# Patient Record
Sex: Male | Born: 1935 | Race: White | Hispanic: No | Marital: Single | Smoking: Former smoker
Health system: Southern US, Community
[De-identification: ages and names within clinical notes are randomized; demographics above are authoritative.]

## PROBLEM LIST (undated history)

## (undated) DIAGNOSIS — N400 Enlarged prostate without lower urinary tract symptoms: Secondary | ICD-10-CM

## (undated) DIAGNOSIS — J9383 Other pneumothorax: Secondary | ICD-10-CM

## (undated) DIAGNOSIS — K219 Gastro-esophageal reflux disease without esophagitis: Secondary | ICD-10-CM

## (undated) DIAGNOSIS — K5790 Diverticulosis of intestine, part unspecified, without perforation or abscess without bleeding: Secondary | ICD-10-CM

## (undated) DIAGNOSIS — I712 Thoracic aortic aneurysm, without rupture: Secondary | ICD-10-CM

## (undated) DIAGNOSIS — N390 Urinary tract infection, site not specified: Secondary | ICD-10-CM

## (undated) DIAGNOSIS — I7121 Aneurysm of the ascending aorta, without rupture: Secondary | ICD-10-CM

## (undated) DIAGNOSIS — C3490 Malignant neoplasm of unspecified part of unspecified bronchus or lung: Secondary | ICD-10-CM

## (undated) DIAGNOSIS — I10 Essential (primary) hypertension: Secondary | ICD-10-CM

## (undated) DIAGNOSIS — E119 Type 2 diabetes mellitus without complications: Secondary | ICD-10-CM

## (undated) DIAGNOSIS — E785 Hyperlipidemia, unspecified: Secondary | ICD-10-CM

## (undated) DIAGNOSIS — J449 Chronic obstructive pulmonary disease, unspecified: Secondary | ICD-10-CM

## (undated) DIAGNOSIS — M1990 Unspecified osteoarthritis, unspecified site: Secondary | ICD-10-CM

## (undated) DIAGNOSIS — I2510 Atherosclerotic heart disease of native coronary artery without angina pectoris: Secondary | ICD-10-CM

## (undated) HISTORY — PX: OTHER SURGICAL HISTORY: SHX169

## (undated) HISTORY — PX: BACK SURGERY: SHX140

## (undated) HISTORY — DX: Thoracic aortic aneurysm, without rupture: I71.2

## (undated) HISTORY — DX: Atherosclerotic heart disease of native coronary artery without angina pectoris: I25.10

## (undated) HISTORY — DX: Malignant neoplasm of unspecified part of unspecified bronchus or lung: C34.90

## (undated) HISTORY — DX: Benign prostatic hyperplasia without lower urinary tract symptoms: N40.0

## (undated) HISTORY — DX: Unspecified osteoarthritis, unspecified site: M19.90

## (undated) HISTORY — DX: Chronic obstructive pulmonary disease, unspecified: J44.9

## (undated) HISTORY — DX: Hyperlipidemia, unspecified: E78.5

## (undated) HISTORY — DX: Aneurysm of the ascending aorta, without rupture: I71.21

## (undated) HISTORY — DX: Type 2 diabetes mellitus without complications: E11.9

## (undated) HISTORY — DX: Other pneumothorax: J93.83

## (undated) HISTORY — DX: Gastro-esophageal reflux disease without esophagitis: K21.9

---

## 1976-02-16 HISTORY — PX: LUMBAR LAMINECTOMY: SHX95

## 1977-02-15 HISTORY — PX: OTHER SURGICAL HISTORY: SHX169

## 1981-02-15 HISTORY — PX: OTHER SURGICAL HISTORY: SHX169

## 1998-02-15 DIAGNOSIS — C3490 Malignant neoplasm of unspecified part of unspecified bronchus or lung: Secondary | ICD-10-CM

## 1998-02-15 HISTORY — PX: LOBECTOMY: SHX5089

## 1998-02-15 HISTORY — DX: Malignant neoplasm of unspecified part of unspecified bronchus or lung: C34.90

## 1998-10-09 ENCOUNTER — Encounter (INDEPENDENT_AMBULATORY_CARE_PROVIDER_SITE_OTHER): Payer: Self-pay | Admitting: Specialist

## 1998-10-09 ENCOUNTER — Ambulatory Visit (HOSPITAL_COMMUNITY): Admission: RE | Admit: 1998-10-09 | Discharge: 1998-10-09 | Payer: Self-pay | Admitting: Pulmonary Disease

## 1998-10-09 ENCOUNTER — Encounter: Payer: Self-pay | Admitting: Pulmonary Disease

## 1998-10-17 ENCOUNTER — Ambulatory Visit (HOSPITAL_COMMUNITY): Admission: RE | Admit: 1998-10-17 | Discharge: 1998-10-17 | Payer: Self-pay | Admitting: Pulmonary Disease

## 1998-10-17 ENCOUNTER — Encounter: Payer: Self-pay | Admitting: Pulmonary Disease

## 1998-10-21 ENCOUNTER — Encounter: Payer: Self-pay | Admitting: Pulmonary Disease

## 1998-10-22 ENCOUNTER — Encounter: Payer: Self-pay | Admitting: Thoracic Surgery

## 1998-10-23 ENCOUNTER — Encounter: Payer: Self-pay | Admitting: Thoracic Surgery

## 1998-10-23 ENCOUNTER — Encounter: Payer: Self-pay | Admitting: Internal Medicine

## 1998-10-23 ENCOUNTER — Inpatient Hospital Stay (HOSPITAL_COMMUNITY): Admission: RE | Admit: 1998-10-23 | Discharge: 1998-10-29 | Payer: Self-pay | Admitting: Thoracic Surgery

## 1998-10-24 ENCOUNTER — Encounter: Payer: Self-pay | Admitting: Thoracic Surgery

## 1998-10-24 ENCOUNTER — Encounter: Payer: Self-pay | Admitting: Internal Medicine

## 1998-10-25 ENCOUNTER — Encounter: Payer: Self-pay | Admitting: Thoracic Surgery

## 1998-10-26 ENCOUNTER — Encounter: Payer: Self-pay | Admitting: Family Medicine

## 1998-10-27 ENCOUNTER — Encounter: Payer: Self-pay | Admitting: Thoracic Surgery

## 1998-10-28 ENCOUNTER — Encounter: Payer: Self-pay | Admitting: Thoracic Surgery

## 1998-10-29 ENCOUNTER — Encounter: Payer: Self-pay | Admitting: Thoracic Surgery

## 1999-03-10 ENCOUNTER — Encounter: Admission: RE | Admit: 1999-03-10 | Discharge: 1999-03-10 | Payer: Self-pay | Admitting: Thoracic Surgery

## 1999-03-10 ENCOUNTER — Encounter: Payer: Self-pay | Admitting: Thoracic Surgery

## 1999-06-09 ENCOUNTER — Encounter: Payer: Self-pay | Admitting: Thoracic Surgery

## 1999-06-09 ENCOUNTER — Encounter: Admission: RE | Admit: 1999-06-09 | Discharge: 1999-06-09 | Payer: Self-pay | Admitting: Thoracic Surgery

## 1999-08-26 ENCOUNTER — Ambulatory Visit (HOSPITAL_COMMUNITY): Admission: RE | Admit: 1999-08-26 | Discharge: 1999-08-26 | Payer: Self-pay | Admitting: Gastroenterology

## 1999-08-26 ENCOUNTER — Encounter (INDEPENDENT_AMBULATORY_CARE_PROVIDER_SITE_OTHER): Payer: Self-pay | Admitting: Specialist

## 1999-09-08 ENCOUNTER — Encounter: Admission: RE | Admit: 1999-09-08 | Discharge: 1999-09-08 | Payer: Self-pay | Admitting: Thoracic Surgery

## 1999-09-08 ENCOUNTER — Encounter: Payer: Self-pay | Admitting: Thoracic Surgery

## 1999-12-09 ENCOUNTER — Encounter: Admission: RE | Admit: 1999-12-09 | Discharge: 1999-12-09 | Payer: Self-pay | Admitting: Thoracic Surgery

## 1999-12-09 ENCOUNTER — Encounter: Payer: Self-pay | Admitting: Thoracic Surgery

## 2000-04-12 ENCOUNTER — Encounter: Admission: RE | Admit: 2000-04-12 | Discharge: 2000-04-12 | Payer: Self-pay | Admitting: Thoracic Surgery

## 2000-04-12 ENCOUNTER — Encounter: Payer: Self-pay | Admitting: Thoracic Surgery

## 2000-05-03 ENCOUNTER — Encounter: Admission: RE | Admit: 2000-05-03 | Discharge: 2000-05-03 | Payer: Self-pay | Admitting: Family Medicine

## 2000-05-03 ENCOUNTER — Encounter: Payer: Self-pay | Admitting: Family Medicine

## 2000-10-12 ENCOUNTER — Encounter: Payer: Self-pay | Admitting: Thoracic Surgery

## 2000-10-12 ENCOUNTER — Encounter: Admission: RE | Admit: 2000-10-12 | Discharge: 2000-10-12 | Payer: Self-pay | Admitting: Thoracic Surgery

## 2002-03-30 ENCOUNTER — Encounter: Payer: Self-pay | Admitting: Family Medicine

## 2002-03-30 ENCOUNTER — Encounter: Admission: RE | Admit: 2002-03-30 | Discharge: 2002-03-30 | Payer: Self-pay | Admitting: Family Medicine

## 2002-12-30 ENCOUNTER — Emergency Department (HOSPITAL_COMMUNITY): Admission: EM | Admit: 2002-12-30 | Discharge: 2002-12-30 | Payer: Self-pay | Admitting: Emergency Medicine

## 2003-06-04 ENCOUNTER — Emergency Department (HOSPITAL_COMMUNITY): Admission: EM | Admit: 2003-06-04 | Discharge: 2003-06-04 | Payer: Self-pay | Admitting: Emergency Medicine

## 2005-06-29 ENCOUNTER — Emergency Department (HOSPITAL_COMMUNITY): Admission: EM | Admit: 2005-06-29 | Discharge: 2005-06-29 | Payer: Self-pay | Admitting: Emergency Medicine

## 2007-05-29 ENCOUNTER — Encounter: Admission: RE | Admit: 2007-05-29 | Discharge: 2007-05-29 | Payer: Self-pay | Admitting: Family Medicine

## 2007-12-27 ENCOUNTER — Encounter: Admission: RE | Admit: 2007-12-27 | Discharge: 2007-12-27 | Payer: Self-pay | Admitting: Family Medicine

## 2008-02-16 HISTORY — PX: CARDIAC CATHETERIZATION: SHX172

## 2008-03-21 ENCOUNTER — Emergency Department (HOSPITAL_COMMUNITY): Admission: EM | Admit: 2008-03-21 | Discharge: 2008-03-21 | Payer: Self-pay | Admitting: Emergency Medicine

## 2008-05-22 ENCOUNTER — Encounter: Payer: Self-pay | Admitting: Internal Medicine

## 2008-05-22 ENCOUNTER — Encounter: Payer: Self-pay | Admitting: Cardiology

## 2008-05-22 DIAGNOSIS — J449 Chronic obstructive pulmonary disease, unspecified: Secondary | ICD-10-CM

## 2008-05-22 HISTORY — DX: Chronic obstructive pulmonary disease, unspecified: J44.9

## 2008-05-23 ENCOUNTER — Encounter: Payer: Self-pay | Admitting: Cardiology

## 2008-05-29 ENCOUNTER — Encounter: Admission: RE | Admit: 2008-05-29 | Discharge: 2008-05-29 | Payer: Self-pay | Admitting: Family Medicine

## 2008-05-29 ENCOUNTER — Encounter: Payer: Self-pay | Admitting: Cardiology

## 2008-06-10 ENCOUNTER — Encounter: Admission: RE | Admit: 2008-06-10 | Discharge: 2008-06-10 | Payer: Self-pay | Admitting: Family Medicine

## 2008-06-10 ENCOUNTER — Encounter: Payer: Self-pay | Admitting: Internal Medicine

## 2008-06-13 ENCOUNTER — Ambulatory Visit: Payer: Self-pay | Admitting: Cardiology

## 2008-06-13 DIAGNOSIS — M279 Disease of jaws, unspecified: Secondary | ICD-10-CM | POA: Insufficient documentation

## 2008-06-13 DIAGNOSIS — E785 Hyperlipidemia, unspecified: Secondary | ICD-10-CM | POA: Insufficient documentation

## 2008-06-13 DIAGNOSIS — E119 Type 2 diabetes mellitus without complications: Secondary | ICD-10-CM

## 2008-06-13 DIAGNOSIS — R072 Precordial pain: Secondary | ICD-10-CM

## 2008-06-18 ENCOUNTER — Telehealth (INDEPENDENT_AMBULATORY_CARE_PROVIDER_SITE_OTHER): Payer: Self-pay | Admitting: *Deleted

## 2008-06-19 ENCOUNTER — Encounter (INDEPENDENT_AMBULATORY_CARE_PROVIDER_SITE_OTHER): Payer: Self-pay | Admitting: *Deleted

## 2008-06-19 ENCOUNTER — Encounter: Payer: Self-pay | Admitting: Cardiology

## 2008-06-19 ENCOUNTER — Ambulatory Visit: Payer: Self-pay

## 2008-06-19 ENCOUNTER — Ambulatory Visit: Payer: Self-pay | Admitting: Cardiovascular Disease

## 2008-06-19 DIAGNOSIS — R9430 Abnormal result of cardiovascular function study, unspecified: Secondary | ICD-10-CM | POA: Insufficient documentation

## 2008-06-20 LAB — CONVERTED CEMR LAB
Basophils Absolute: 0 10*3/uL (ref 0.0–0.1)
Basophils Relative: 0.3 % (ref 0.0–3.0)
CO2: 30 meq/L (ref 19–32)
Calcium: 9.5 mg/dL (ref 8.4–10.5)
Chloride: 106 meq/L (ref 96–112)
Eosinophils Absolute: 0.1 10*3/uL (ref 0.0–0.7)
Glucose, Bld: 120 mg/dL — ABNORMAL HIGH (ref 70–99)
HCT: 43.6 % (ref 39.0–52.0)
Hemoglobin: 15.1 g/dL (ref 13.0–17.0)
Lymphs Abs: 1.6 10*3/uL (ref 0.7–4.0)
MCHC: 34.6 g/dL (ref 30.0–36.0)
MCV: 88.3 fL (ref 78.0–100.0)
Monocytes Absolute: 0.5 10*3/uL (ref 0.1–1.0)
Neutro Abs: 5.3 10*3/uL (ref 1.4–7.7)
Potassium: 3.9 meq/L (ref 3.5–5.1)
RBC: 4.93 M/uL (ref 4.22–5.81)
RDW: 12.9 % (ref 11.5–14.6)
Sodium: 142 meq/L (ref 135–145)

## 2008-06-21 ENCOUNTER — Ambulatory Visit: Payer: Self-pay | Admitting: Cardiovascular Disease

## 2008-06-21 ENCOUNTER — Inpatient Hospital Stay (HOSPITAL_BASED_OUTPATIENT_CLINIC_OR_DEPARTMENT_OTHER): Admission: RE | Admit: 2008-06-21 | Discharge: 2008-06-21 | Payer: Self-pay | Admitting: Cardiovascular Disease

## 2008-06-26 ENCOUNTER — Ambulatory Visit: Payer: Self-pay | Admitting: Cardiovascular Disease

## 2008-06-26 ENCOUNTER — Inpatient Hospital Stay (HOSPITAL_COMMUNITY): Admission: RE | Admit: 2008-06-26 | Discharge: 2008-06-27 | Payer: Self-pay | Admitting: Cardiovascular Disease

## 2008-06-28 DIAGNOSIS — I10 Essential (primary) hypertension: Secondary | ICD-10-CM | POA: Insufficient documentation

## 2008-06-28 DIAGNOSIS — I080 Rheumatic disorders of both mitral and aortic valves: Secondary | ICD-10-CM | POA: Insufficient documentation

## 2008-06-28 DIAGNOSIS — J449 Chronic obstructive pulmonary disease, unspecified: Secondary | ICD-10-CM

## 2008-06-29 ENCOUNTER — Emergency Department (HOSPITAL_COMMUNITY): Admission: EM | Admit: 2008-06-29 | Discharge: 2008-06-29 | Payer: Self-pay | Admitting: Emergency Medicine

## 2008-07-02 ENCOUNTER — Ambulatory Visit: Payer: Self-pay | Admitting: Cardiology

## 2008-07-02 DIAGNOSIS — I259 Chronic ischemic heart disease, unspecified: Secondary | ICD-10-CM

## 2008-10-04 ENCOUNTER — Ambulatory Visit: Payer: Self-pay | Admitting: Cardiology

## 2008-11-21 ENCOUNTER — Encounter: Admission: RE | Admit: 2008-11-21 | Discharge: 2008-11-21 | Payer: Self-pay | Admitting: Family Medicine

## 2008-11-21 ENCOUNTER — Encounter: Payer: Self-pay | Admitting: Internal Medicine

## 2008-11-28 ENCOUNTER — Encounter: Payer: Self-pay | Admitting: Internal Medicine

## 2008-11-28 ENCOUNTER — Encounter: Admission: RE | Admit: 2008-11-28 | Discharge: 2008-11-28 | Payer: Self-pay | Admitting: Family Medicine

## 2008-12-17 ENCOUNTER — Ambulatory Visit: Payer: Self-pay | Admitting: Thoracic Surgery (Cardiothoracic Vascular Surgery)

## 2009-01-20 ENCOUNTER — Telehealth: Payer: Self-pay | Admitting: Cardiology

## 2009-02-07 ENCOUNTER — Emergency Department (HOSPITAL_COMMUNITY): Admission: EM | Admit: 2009-02-07 | Discharge: 2009-02-08 | Payer: Self-pay | Admitting: Emergency Medicine

## 2009-03-13 ENCOUNTER — Encounter: Admission: RE | Admit: 2009-03-13 | Discharge: 2009-03-13 | Payer: Self-pay | Admitting: Family Medicine

## 2009-03-13 ENCOUNTER — Encounter: Payer: Self-pay | Admitting: Internal Medicine

## 2009-04-12 ENCOUNTER — Emergency Department (HOSPITAL_COMMUNITY): Admission: EM | Admit: 2009-04-12 | Discharge: 2009-04-13 | Payer: Self-pay | Admitting: Emergency Medicine

## 2009-04-14 ENCOUNTER — Emergency Department (HOSPITAL_COMMUNITY): Admission: EM | Admit: 2009-04-14 | Discharge: 2009-04-14 | Payer: Self-pay | Admitting: Emergency Medicine

## 2009-04-14 ENCOUNTER — Telehealth: Payer: Self-pay | Admitting: Internal Medicine

## 2009-04-21 ENCOUNTER — Encounter: Payer: Self-pay | Admitting: Internal Medicine

## 2009-04-24 ENCOUNTER — Ambulatory Visit (HOSPITAL_COMMUNITY): Admission: RE | Admit: 2009-04-24 | Discharge: 2009-04-24 | Payer: Self-pay | Admitting: Family Medicine

## 2009-04-25 ENCOUNTER — Encounter: Admission: RE | Admit: 2009-04-25 | Discharge: 2009-04-25 | Payer: Self-pay | Admitting: Internal Medicine

## 2009-04-25 ENCOUNTER — Ambulatory Visit: Payer: Self-pay | Admitting: Internal Medicine

## 2009-04-25 DIAGNOSIS — R0789 Other chest pain: Secondary | ICD-10-CM | POA: Insufficient documentation

## 2009-04-25 DIAGNOSIS — R042 Hemoptysis: Secondary | ICD-10-CM | POA: Insufficient documentation

## 2009-04-25 DIAGNOSIS — Z85118 Personal history of other malignant neoplasm of bronchus and lung: Secondary | ICD-10-CM

## 2009-04-25 LAB — CONVERTED CEMR LAB
BUN: 9 mg/dL (ref 6–23)
CO2: 30 meq/L (ref 19–32)
Calcium: 9.5 mg/dL (ref 8.4–10.5)
GFR calc non Af Amer: 116.28 mL/min (ref >60)
Glucose, Bld: 123 mg/dL — ABNORMAL HIGH (ref 70–99)

## 2009-04-28 ENCOUNTER — Ambulatory Visit: Payer: Self-pay | Admitting: Cardiology

## 2009-04-28 DIAGNOSIS — I712 Thoracic aortic aneurysm, without rupture, unspecified: Secondary | ICD-10-CM | POA: Insufficient documentation

## 2010-01-05 ENCOUNTER — Encounter
Admission: RE | Admit: 2010-01-05 | Discharge: 2010-01-05 | Payer: Self-pay | Admitting: Thoracic Surgery (Cardiothoracic Vascular Surgery)

## 2010-01-05 ENCOUNTER — Ambulatory Visit: Payer: Self-pay | Admitting: Thoracic Surgery (Cardiothoracic Vascular Surgery)

## 2010-03-19 NOTE — Assessment & Plan Note (Signed)
Summary: rov per pt call/js   Visit Type:  Follow-up Primary Provider:  Mosetta Putt, MD  CC:  CAD.  History of Present Illness: The patient presents for followup of his coronary disease. Since I last saw him he has continued to have a workup for evaluation of discomfort that is in his abdomen and lower chest. He did have a CT of his chest and I reviewed this with him. It demonstrated no acute pulmonary findings and is stable descending aorta of 5 cm. He's had no new cardiovascular symptoms similar to the symptoms he had in May of last year when he had his stent. He continues on the meds as listed but says he very much wants to come off the Plavix because of easy bruising. He said recently he had some mild trauma to his knee and bruising cause significant discomfort which is only slowly resolving. He's had no new shortness of breath, PND or orthopnea. He's had no palpitations, presyncope or syncope.  Current Medications (verified): 1)  Cozaar 50 Mg Tabs (Losartan Potassium) .... Take 1 Tablet By Mouth Once A Day 2)  Flomax 0.4 Mg Xr24h-Cap (Tamsulosin Hcl) .... Take 1 Capsule By Mouth Once A Day 3)  Aspirin 81 Mg  Tabs (Aspirin) .... Daily 4)  Plavix 75 Mg Tabs (Clopidogrel Bisulfate) .Marland Kitchen.. 1 By Mouth Daily  Allergies (verified): No Known Drug Allergies  Past History:  Past Medical History: Reviewed history from 07/02/2008 and no changes required. GERD "Arthritis" [place] Spontaneous pneumothorax Diabetes x 2 -3 years (well controlled) Borderline dysplipidemia BPH Current Problems:  HYPERLIPIDEMIA (ICD-272.4) AODM (ICD-250.00) PRECORDIAL PAIN (ICD-786.51) JAW PAIN (ICD-526.9) CAD s/p Xience stent ot the distal RCA  Past Surgical History: Reviewed history from 06/28/2008 and no changes required. Right lower lobectomy 2000 Needle removed from the right foot Lumbar laminectomy Lymph node resected 1983  Review of Systems       As stated in the HPI and negative for all  other systems.   Vital Signs:  Patient profile:   77 year old male Height:      67 inches Weight:      176 pounds BMI:     27.67 Pulse rate:   67 / minute Resp:     16 per minute BP sitting:   136 / 68  (right arm)  Vitals Entered By: Marrion Coy, CNA (April 28, 2009 11:57 AM)  Physical Exam  General:  Well developed, well nourished, in no acute distress. Head:  normocephalic and atraumatic Eyes:  PERRLA/EOM intact; conjunctiva and lids normal.cataract OD.   Mouth:  Gums and palate normal. Oral mucosa normal. Neck:  Neck supple, no JVD. No masses, thyromegaly or abnormal cervical nodes. Chest Wall:  no deformities or breast masses noted Lungs:  Clear bilaterally to auscultation and percussion. Heart:  Non-displaced PMI, chest non-tender; regular rate and rhythm, S1, S2 without murmurs, rubs or gallops. Carotid upstroke normal, no bruit. Normal abdominal aortic size, no bruits. Femorals normal pulses, no bruits. Pedals normal pulses. No edema, no varicosities. Abdomen:  Bowel sounds positive; abdomen soft and non-tender without masses, organomegaly, or hernias noted. No hepatosplenomegaly. Msk:  Back normal, normal gait. Muscle strength and tone normal. Extremities:  No clubbing or cyanosis. Neurologic:  Alert and oriented x 3. Skin:  Intact without lesions or rashes. Psych:  Normal affect.   EKG  Procedure date:  04/28/2009  Findings:      sinus rhythm, rate 67, axis within normal limits, intervals within normal limits, no  acute ST-T wave changes  Impression & Recommendations:  Problem # 1:  CORONARY ARTERY DISEASE, S/P PTCA (ICD-414.9) The patient has had no symptoms suggestive of angina. At this point he will continue with risk reduction. We discussed Plavix. He very much wants to stop this medication and does have some relative contraindications. Therefore, he will stop it after the one-year anniversary of his stent. He will continue on the other medicines as  listed.  Problem # 2:  HYPERTENSION (ICD-401.9) His blood pressure is controlled on the meds as listed.  Problem # 3:  ASCENDING AORTIC ANEURYSM (ICD-441.2) This has been stable clinically and can be followed with repeat studies.  Other Orders: EKG w/ Interpretation (93000)  Patient Instructions: 1)  Your physician recommends that you schedule a follow-up appointment in: 12 months with Dr Antoine Poche 2)  Your physician recommends that you continue on your current medications as directed. Please refer to the Current Medication list given to you today.

## 2010-03-19 NOTE — Assessment & Plan Note (Signed)
Summary: consult/kcw   Primary Provider/Referring Provider:  Mosetta Putt, MD  CC:  consult.  History of Present Illness: April 25, 2009- 77 yoM referred by Dr Duaine Dredge concerned about hemoptysis.  He describes streaks of blood mixed in white sputum over a period of several days, 6 weeks ago. Daily productive cough has been increased over the past month with variable wheeze, no more blood.  had some low right back pain over the same interval with US GB reported negative and GB scan on 04/24/09 result pending.  Smoked 1-3 PPD x 37 years ending in 1986. In 2000 he saw Dr Sung Amabile for streak heme with negative CT. Bronchoscopy dx'd Squamous Cell Ca  and Dr Edwyna Shell did a curative Right Lower Lobectomy without adjuvant Rx. Has CAD and ascending aortic arch aneurysm. Coronary stent, no MI. On aspirin and plavix.Has lost weight over last 2 years and more recently gaining. Denies fever, chills, purulent discharge, nodes, nausea, heart burn. CT w/CM as ammended 11/28/08- prominent mediastinum due to 4.8cm ascending aortic aneurysm. Aortic valve calcification, CAD. PFT 05/22/08-FEV1 1.31/ 51%, FEV1/FVC 44.9 Moderate to severe obstruction. Had pneumovax.  Current Medications (verified): 1)  Cozaar 50 Mg Tabs (Losartan Potassium) .... Take 1 Tablet By Mouth Once A Day 2)  Flomax 0.4 Mg Xr24h-Cap (Tamsulosin Hcl) .... Take 1 Capsule By Mouth Once A Day 3)  Aspirin 81 Mg  Tabs (Aspirin) .... Daily 4)  Plavix 75 Mg Tabs (Clopidogrel Bisulfate) .Marland Kitchen.. 1 By Mouth Daily  Allergies (verified): No Known Drug Allergies  Past History:  Family History: Last updated: 07-02-2008 The patient's father died from complications of diabetes although he was also an alcoholic and smoked quite a bit. Otherwise there is no early heart. Mother died at 59 old age.  Social History: Last updated: 04/25/2009 Retired [employer] Divorced x 4 He has 3 children. He is currently single. quit smoking in 1986 after smoking for at  least 30 years. Hx of Alcohol Use - yes  Past Medical History: GERD "Arthritis" COPD FEV1/FVC 05/22/08 44.9 Squamous Cell Ca Lung 2000- Curative RLL lobectomy   -CTw/CM- 04/25/09- COPD, s/p RLL, CAD, 5cm ascending aortic aneurysm Spontaneous pneumothorax Diabetes x 2 -3 years (well controlled) Borderline dysplipidemia BPH DGD Current Problems:  HYPERLIPIDEMIA (ICD-272.4) AODM (ICD-250.00) PRECORDIAL PAIN (ICD-786.51) JAW PAIN (ICD-526.9) CAD s/p Xience stent ot the distal RCA  Past Surgical History: Right lower lobectomy 2000 for Squamoius cell Ca Needle removed from the right foot Lumbar laminectomy 1978 Lymph node resected 1983 axilla Spontaneous pneumothrax 1979 Card cath/ stent 2010  Social History: Retired Hotel manager Divorced x 4 He has 3 children. He is currently single. quit smoking in 1986 after smoking for at least 30 years. Hx of Alcohol Use - yes  Review of Systems       The patient complains of shortness of breath with activity, productive cough, coughing up blood, abdominal pain, sore throat, nasal congestion/difficulty breathing through nose, and sneezing.  The patient denies shortness of breath at rest, non-productive cough, chest pain, irregular heartbeats, acid heartburn, indigestion, loss of appetite, weight change, difficulty swallowing, tooth/dental problems, headaches, itching, ear ache, anxiety, depression, hand/feet swelling, joint stiffness or pain, rash, change in color of mucus, and fever.         No unusual aching, swelling or pain in his legs.  Vital Signs:  Patient profile:   77 year old male Height:      68 inches Weight:      180 pounds BMI:  27.47 O2 Sat:      94 % on Room air Pulse rate:   87 / minute BP sitting:   130 / 68  (left arm) Cuff size:   regular  Vitals Entered By: Reynaldo Minium CMA (April 25, 2009 9:39 AM)  O2 Flow:  Room air  Physical Exam  Additional Exam:  General: A/Ox3; pleasant and cooperative, NAD, medium  build SKIN: no rash, lesions NODES: no lymphadenopathy HEENT: [place]/AT, EOM- WNL, Conjuctivae- clear, PERRLA, TM-WNL, Nose- clear, Throat- clear and wnl NECK: Supple w/ fair ROM, JVD- none, normal carotid impulses w/o bruits Thyroid- normal to palpation CHEST: Clear to P&A, coarse breath sounds without  wheeze, dullness or rub. Raspy cough x 1. HEART: RRR, no m/g/r heard ABDOMEN: Soft and nl; nml bowel sounds; no organomegaly or masses noted EAV:WUJW, nl pulses, no edema  NEURO: Grossly intact to observation      Impression & Recommendations:  Problem # 1:  HEMOPTYSIS UNSPECIFIED (ICD-786.30)  Episode of light hemoptysis 6 weeks ago, on anticoagulants. This is probably unrelated to his back pain. It has not recurred despite some increase in bronchitic cough.. He understands that the heme which lead to bronch and discovery of his lung cancer in 2000 was probably unrelated to that cancer. His last Chest CT was 5 months ago.  Discussing options, we decided to begin with a new CT. This might also shed some light on his back pain. The decision to repeat bronchoscopy is separate from CT, except that CT might possibly suggest where to focus attention.  Problem # 2:  CHEST PAIN, ATYPICAL (ICD-786.59) Right low back and anterior low chest pain being evaluated as possible GB. I doubt this is related to his heme. Consider nerve root irritation from his DGD. Awaiting result of his GB scan from yesterday.  Orders: Radiology Referral (Radiology) TLB-BMP (Basic Metabolic Panel-BMET) (80048-METABOL)  Problem # 3:  COPD (ICD-496) Moderately severe COPD. Limited potential for response to bronchodilators, but this will be reconsidered. He needs to continue activity to maintain endurance.  Other Orders: Consultation Level III (11914)  Patient Instructions: 1)  Please schedule a follow-up appointment in 3 weeks 2)  Let us know if you see more blood. 3)  A Chest CT with Contrast has been recommended.   Your imaging study may require preauthorization.  4)  Lab

## 2010-03-19 NOTE — Progress Notes (Signed)
Summary: needs to rsc consult w/ cy  Phone Note Call from Patient   Caller: Patient Call For: YOUNG Summary of Call: pt just got out of er. needs to rsc appt (new consult) w/ dr young. this was a referral from dr Theron Arista blomgren. pt states that he should see dr young since this is who dr b wanted pt to see. call pt re: new consult appt at [phone]. don't call before 5pm unless you want to leave a msg and pt can call back. pt also dropped off xray from [facility] for dr young to review. i will give this to [facility].  Initial call taken by: Tivis Ringer, CNA,  April 14, 2009 11:57 AM  Follow-up for Phone Call        Appt made for 04-25-09 at 930am; only spot open asap. Called to inform pt of appt but had to leave message. Asked that pt call me back to comfirm appt.Reynaldo Minium CMA  April 16, 2009 1:47 PM   LMTCB.Reynaldo Minium CMA  April 21, 2009 4:21 PM   Additional Follow-up for Phone Call Additional follow up Details #1::        Mailed letter to pt's home as well to confirm appt with [place].Reynaldo Minium CMA  April 21, 2009 4:26 PM      Appended Document: needs to rsc consult w/ cy Spoke with pt; aware of appt date and time.

## 2010-03-19 NOTE — Letter (Signed)
SummaryScience writer Pulmonary Care Appointment Letter  [facility]  [address]   [place], [place] [address]   Phone: [phone]  Fax: [phone]    04/21/2009 MRN: [id number]  Brian Austin [address] [place], [place]  [address]  Dear Mr. GRELL,   Our office is attempting to contact you about an appointment.We have your appointment scheduled for Friday April 25, 2009 at 9:30am; you will need to arrive 15 minutes early to fill out paperwork. Please remember to bring you insurance card and copay the day of your appointment.    Please call our office at [phone] to confirm this appointment with Dr.Latesia Norrington. Please ask to speak with Florentina Addison; nurse for Dr. Maple Hudson.   Our registration staff is prepared to assist you with any questions you may have.    Thank you,     Nature conservation officer Pulmonary Division

## 2010-03-19 NOTE — Consult Note (Signed)
[facility]   Imported By: Lester Tensas 05/06/2009 07:58:01  _____________________________________________________________________  External Attachment:    Type:   Image     Comment:   External Document

## 2010-05-06 LAB — DIFFERENTIAL
Basophils Absolute: 0 10*3/uL (ref 0.0–0.1)
Basophils Relative: 0 % (ref 0–1)
Eosinophils Absolute: 0.2 10*3/uL (ref 0.0–0.7)
Monocytes Relative: 9 % (ref 3–12)
Neutro Abs: 4.8 10*3/uL (ref 1.7–7.7)
Neutrophils Relative %: 64 % (ref 43–77)

## 2010-05-06 LAB — COMPREHENSIVE METABOLIC PANEL
ALT: 22 U/L (ref 0–53)
Alkaline Phosphatase: 49 U/L (ref 39–117)
BUN: 13 mg/dL (ref 6–23)
CO2: 25 mEq/L (ref 19–32)
GFR calc non Af Amer: >60 mL/min (ref >60)
Glucose, Bld: 125 mg/dL — ABNORMAL HIGH (ref 70–99)
Potassium: 4.3 mEq/L (ref 3.5–5.1)
Total Protein: 7.7 g/dL (ref 6.0–8.3)

## 2010-05-06 LAB — URINALYSIS, ROUTINE W REFLEX MICROSCOPIC
Glucose, UA: NEGATIVE mg/dL
Hgb urine dipstick: NEGATIVE
Ketones, ur: NEGATIVE mg/dL
Protein, ur: NEGATIVE mg/dL

## 2010-05-06 LAB — CBC
HCT: 46.8 % (ref 39.0–52.0)
Hemoglobin: 15.7 g/dL (ref 13.0–17.0)
MCHC: 33.5 g/dL (ref 30.0–36.0)
RBC: 5.21 MIL/uL (ref 4.22–5.81)
RDW: 13.8 % (ref 11.5–15.5)

## 2010-05-06 LAB — LIPASE, BLOOD: Lipase: 32 U/L (ref 11–59)

## 2010-05-18 LAB — URINALYSIS, ROUTINE W REFLEX MICROSCOPIC
Bilirubin Urine: NEGATIVE
Glucose, UA: NEGATIVE mg/dL
Ketones, ur: NEGATIVE mg/dL
pH: 5.5 (ref 5.0–8.0)

## 2010-05-18 LAB — COMPREHENSIVE METABOLIC PANEL
ALT: 18 U/L (ref 0–53)
AST: 19 U/L (ref 0–37)
Albumin: 4 g/dL (ref 3.5–5.2)
CO2: 26 mEq/L (ref 19–32)
Calcium: 9.6 mg/dL (ref 8.4–10.5)
Chloride: 101 mEq/L (ref 96–112)
GFR calc Af Amer: >60 mL/min (ref >60)
GFR calc non Af Amer: >60 mL/min (ref >60)
Sodium: 136 mEq/L (ref 135–145)

## 2010-05-18 LAB — LIPASE, BLOOD: Lipase: 23 U/L (ref 11–59)

## 2010-05-18 LAB — CBC
MCHC: 34 g/dL (ref 30.0–36.0)
RBC: 4.82 MIL/uL (ref 4.22–5.81)
WBC: 11.2 10*3/uL — ABNORMAL HIGH (ref 4.0–10.5)

## 2010-05-18 LAB — URINE CULTURE
Colony Count: NO GROWTH
Culture: NO GROWTH

## 2010-05-18 LAB — DIFFERENTIAL
Eosinophils Absolute: 0.1 10*3/uL (ref 0.0–0.7)
Eosinophils Relative: 1 % (ref 0–5)
Lymphs Abs: 1.7 10*3/uL (ref 0.7–4.0)
Monocytes Absolute: 1.2 10*3/uL — ABNORMAL HIGH (ref 0.1–1.0)

## 2010-05-26 LAB — CBC
HCT: 46.4 % (ref 39.0–52.0)
Hemoglobin: 14.7 g/dL (ref 13.0–17.0)
MCHC: 34.1 g/dL (ref 30.0–36.0)
MCHC: 34.3 g/dL (ref 30.0–36.0)
MCV: 88.3 fL (ref 78.0–100.0)
MCV: 88.7 fL (ref 78.0–100.0)
Platelets: UNDETERMINED 10*3/uL (ref 150–400)
RBC: 4.87 MIL/uL (ref 4.22–5.81)
RDW: 14 % (ref 11.5–15.5)
WBC: 6.6 10*3/uL (ref 4.0–10.5)

## 2010-05-26 LAB — BASIC METABOLIC PANEL
BUN: 13 mg/dL (ref 6–23)
CO2: 27 mEq/L (ref 19–32)
CO2: 27 mEq/L (ref 19–32)
Chloride: 103 mEq/L (ref 96–112)
Chloride: 107 mEq/L (ref 96–112)
Creatinine, Ser: 0.77 mg/dL (ref 0.4–1.5)
GFR calc Af Amer: >60 mL/min (ref >60)
Glucose, Bld: 134 mg/dL — ABNORMAL HIGH (ref 70–99)
Sodium: 140 mEq/L (ref 135–145)

## 2010-05-26 LAB — GLUCOSE, CAPILLARY
Glucose-Capillary: 148 mg/dL — ABNORMAL HIGH (ref 70–99)
Glucose-Capillary: 95 mg/dL (ref 70–99)

## 2010-06-02 LAB — URINALYSIS, ROUTINE W REFLEX MICROSCOPIC
Glucose, UA: NEGATIVE mg/dL
Hgb urine dipstick: NEGATIVE
Protein, ur: NEGATIVE mg/dL
Specific Gravity, Urine: 1.022 (ref 1.005–1.030)
Urobilinogen, UA: 0.2 mg/dL (ref 0.0–1.0)

## 2010-06-02 LAB — POCT I-STAT, CHEM 8
Creatinine, Ser: 0.8 mg/dL (ref 0.4–1.5)
Hemoglobin: 17.3 g/dL — ABNORMAL HIGH (ref 13.0–17.0)
Potassium: 5 mEq/L (ref 3.5–5.1)
Sodium: 139 mEq/L (ref 135–145)
TCO2: 28 mmol/L (ref 0–100)

## 2010-06-10 ENCOUNTER — Ambulatory Visit: Payer: Self-pay | Admitting: Cardiology

## 2010-06-30 NOTE — Cardiovascular Report (Signed)
NAME:  Brian Austin, SELF NO.:  [id number]   MEDICAL RECORD NO.:  [id number]          PATIENT TYPE:  OIB   LOCATION:  1966                         FACILITY:  [facility]   PHYSICIAN:  Verne Carrow, MDDATE OF BIRTH:  [birth year]   DATE OF PROCEDURE:  06/21/2008  DATE OF DISCHARGE:  06/21/2008                            CARDIAC CATHETERIZATION   PRIMARY CARDIOLOGIST:  Rollene Rotunda, MD, [facility]   PROCEDURE PERFORMED:  1. Left heart catheterization.  2. Selective coronary angiography.  3. Aortic root angiogram.   OPERATOR:  Verne Carrow, MD   INDICATIONS:  This is a 77 year old patient with hyperlipidemia and  diabetes mellitus who has been having exertional jaw pain and was  referred for a stress Myoview which showed inferior wall ischemia.   DETAILS OF PROCEDURE:  The patient was brought into the outpatient  cardiac catheterization laboratory after signing informed consent for  the procedure.  Right groin was prepped and draped in sterile fashion.  Lidocaine 1% was used for local anesthesia.  A JL-5 diagnostic catheter  was used to perform selective angiography of the left coronary system.  A 3-DRC catheter was used to perform selective angiography of the right  coronary artery.  Despite multiple attempts with both a J-tipped wire  and a straight wire and multiple different catheter choices, I was  unable to cross the aortic valve into the left ventricle.  I did perform  an aortic root angiogram that showed that the aortic root was displaced  in a lateral position.  Because of this, I did not make further attempts  to cross into the left ventricle at this time.  The patient tolerated  the procedure well and was taken to the holding area in stable  condition.   HEMODYNAMIC FINDINGS:  Central aortic pressure 110/54.   ANGIOGRAPHIC FINDINGS:  1. Left main coronary artery had no evidence of disease.  2. The left anterior descending is a large vessel that  courses to the      apex and gives off a moderate-sized diagonal branch.  There is 30%      stenosis in the proximal portion of the vessel and 30% stenosis in      the midportion of the LAD.  The diagonal is without any significant      disease.  3. Circumflex artery is a moderate-sized vessel that has plaque in the      midportion.  There are 2 marginal branches that are without any      significant disease.  4. The right coronary artery is a moderate-sized dominant vessel that      has serial 30% lesions in the proximal portion.  There is a 50-60%      lesion in the midportion of the vessel that does not appear to be      flow-limiting.  There is a 95% discrete lesion in the distal      portion of the vessel.  Just prior to the bifurcation into the      posterior descending artery and posterolateral branch, is a 50%      stenosis.  The  posterolateral branch and posterior descending      artery do not have any flow-limiting lesions.   I was unable to cross into the left ventricle secondary to rotation of  the aortic root.  I did take multiple attempts with both a J-tip wire  and a straight wire and used a pigtail catheter, the 3-DRC catheter, and  an AL-1 catheter to properly position the wire.  Attempts were made for  approximately 30 minutes and the procedure was aborted at that time.   Aortic root angiogram shows mild dilation of the aortic root with  lateral rotation of the aortic root.   IMPRESSION:  1. Single-vessel coronary artery disease.  2. Mild dilatation of the aortic root.   RECOMMENDATIONS:  We will plan to proceed to percutaneous coronary  intervention of the significant stenosis in the distal right coronary  artery.  We will load the patient with Plavix today, and we will bring  him back in next week to perform the angioplasty with stent placement.  The patient's metformin will continue to be held following this  catheterization and prior to the next  catheterization.  We will have him  start a full-dose aspirin in the meantime.  We will continue his statin  medication in the meantime.      Verne Carrow, MD  Electronically Signed     CM/MEDQ  D:  06/21/2008  T:  06/22/2008  Job:  [id number]   cc:   Rollene Rotunda, MD, [facility]

## 2010-06-30 NOTE — Assessment & Plan Note (Signed)
OFFICE VISIT   URBAN, NAVAL  DOB:  [birth year]                                        January 05, 2010  CHART #:  [id number]   HISTORY:  The patient is a 77 year old gentleman who returns for a 1-  year followup of an ascending aortic aneurysm.  He has had a right lower  lobectomy for non-small cell carcinoma in 2000.  He had a chest x-ray in  2010, which showed widening of the mediastinum.  A CT of the chest was  done which showed a 4.8-cm ascending aortic aneurysm.  I saw him in last  November and advised him to return in a year with a repeat CT scan.  The  patient states that since his last visit, he is doing well for the most  part.  He did get admitted to the [facility] in June and had 3  additional cardiac stents placed.  He said he was not having much in the  way of symptoms.  He would have some shortness of breath or chest pain  in the mornings, but then would not have any during the rest of the day,  but during his workup, he was found to have additional significant  coronary disease.  He had previously had a stent placed here a couple of  years ago.  He says he has not noted any real significant change in his  symptoms since the stents were placed.   PAST MEDICAL HISTORY:  Significant for ascending aortic aneurysm,  coronary artery disease status post PTCA and stenting in 2010 and 2011,  hypertension, hyperlipidemia, tobacco abuse, COPD, previous right lower  lobectomy for non-small cell carcinoma, type 2 diabetes,  gastroesophageal reflux, mild mitral regurgitation, aortic sclerosis,  lumbar laminectomy, and right axillary lymph node excision for abscess.   CURRENT MEDICATIONS:  1. Cozaar 50 mg daily.  2. Flomax 0.4 mg daily.  3. Plavix 75 mg daily.  4. Aspirin 81 mg daily.  5. Fish oil 2000 mg daily.  6. Vitamin E 400 units daily.   ALLERGIES:  He has no known drug allergies.   REVIEW OF SYSTEMS:  See HPI, otherwise negative.   PHYSICAL EXAMINATION:  General:  The patient is a 76 year old gentleman,  in no acute distress.  He is well developed and well nourished.  Vital  Signs:  Blood pressure is 125/69, pulse 78, respirations 16, and oxygen  saturation is 96% on room air.  Neurological:  He is alert and oriented  x3 with no focal deficits.  Lungs:  Clear, but slightly diminished  breath sounds bilaterally.  There is no wheezing, rhonchi, or rales.  Cardiac:  Regular rate and rhythm.  There is a 2/6 systolic murmur.  No  rubs or gallops.  There is no carotid bruits.  No peripheral edema.   DIAGNOSTIC TESTS:  CT angio of the chest is reviewed and compared with  the study from March of this year.  The official reading is not yet  completed, but the aneurysm appears unchanged.   IMPRESSION:  The patient is a 76 year old gentleman with an ascending  aneurysm at 4.9 cm.  He also has multiple other medical problems  including coronary artery disease, previous lung cancer and pulmonary  resection, and chronic obstructive pulmonary disease.  His aneurysm is  unchanged over the  past 8 months.  I would recommend repeat scan in a  year to reevaluate that and we will schedule that.  Unfortunately, he  did have some additional stents placed and it sounds like he may have  some progression of cardiac disease.  Certainly, if he has any  additional need for coronary intervention, it might be wiser to proceed  with coronary bypass grafting and go ahead and replace his ascending  aorta at that time.  Unfortunately, I was not aware of the situation  when he went down to [place] in June.  In the meantime, we will  continue to follow him.  His blood pressure is well controlled.  I will  plan to see him back in a year.  We would be happy to see him sooner if  necessary.   Salvatore Decent Dorris Fetch, M.D.  Electronically Signed   SCH/MEDQ  D:  01/05/2010  T:  01/05/2010  Job:  [id number]   cc:   Mosetta Putt, M.D.

## 2010-06-30 NOTE — H&P (Signed)
HISTORY AND PHYSICAL EXAMINATION   December 17, 2008   Re:  Brian Austin, Brian Austin        DOB:  [birth year]   REASON FOR CONSULTATION:  Ascending aortic aneurysm.   HISTORY OF PRESENT ILLNESS:  The patient is a 77 year old gentleman with  a history of COPD, curative resection for lung cancer and coronary  artery disease, hypertension, and dyslipidemia.  He saw Dr. Duaine Dredge for  a physical examination back in April.  At that time, he was complaining  of some jaw pain with exertion which Dr. Duaine Dredge diagnosed as angina.  He was referred to Dr. Rollene Rotunda and ultimately underwent PTCA and  stenting of a 95% stenosis in his right coronary.  He recently had a  chest x-ray and followup of his previous lung cancer and that showed a  wide mediastinum which was questionable for a possible mediastinal mass.  He subsequently had a CT of the chest with contrast which showed a 4.8-  cm ascending aortic aneurysm.  The arch and descending aorta are  calcified but not aneurysmal.  The patient states that he still  occasionally has this jaw discomfort, it usually only lasts a minute or  two, it usually happens at night.  He has not had any exertional  symptoms since his angioplasty.  He denies any shortness of breath at  rest but does have some shortness of breath with exertion, particularly  if he exerts himself after eating.  He also has some occasional  dizziness.   PAST MEDICAL HISTORY:  Significant for coronary artery disease status  post PTCA and stenting of his right coronary in 2010, hypertension,  hyperlipidemia, COPD, right lower lobectomy by Dr. Edwyna Shell for squamous  cell carcinoma in 2000 with no evidence of recurrent disease, type 2  diabetes, gastroesophageal reflux with reflux esophagitis, history of  mild mitral regurgitation and aortic sclerosis, lumbar laminectomy,  right axillary lymph node excision for abscess, right lower lobectomy  for cancer.   CURRENT  MEDICATIONS:  1. Cozaar 50 mg daily.  2. Flomax 0.4 mg daily.  3. Plavix 75 mg daily.   He had been on metformin, lovastatin, meloxicam but discontinued all of  those on his own.   ALLERGIES:  He has no known drug allergies.   FAMILY HISTORY:  Had an uncle who died suddenly but no known history of  aneurysmal disease or aortic dissection.   SOCIAL HISTORY:  He is a retired Electronics engineer.  He is single.  He used to smoke,  quit in 1986.  He does have an occasional alcohol drink in the evenings  and at bedtime.   REVIEW OF SYSTEMS:  Patient medical history form is reviewed and is on  the chart, shortness of breath with exertion after eating as noted.  He  does complain of some trouble swallowing which has really been going on  for 30 years, it does not happen all the time but usually is with  liquids.  He does not have any difficulty with swallowing solids.  Dizziness as previously described, arthritis, and does bruise easily  since he has been on Plavix.  All other systems negative.   PHYSICAL EXAMINATION:  General:  The patient is a well-appearing 78-year-  old white male in no acute distress.  He is well developed and well  nourished.  Vital Signs:  His blood pressure is 151/81, pulse 73,  respirations 18, his oxygen saturation is 96% on room air.  Neurologic:  He is alert and  oriented x3 with no focal deficits.  HEENT:  Unremarkable.  Neck:  Supple without thyromegaly, adenopathy, or bruits.  Cardiac:  Regular rate and rhythm.  Normal S1 and S2.  There is no  audible murmur or gallop.  Lungs:  Distant but equal breath sounds  bilaterally with no wheezing.  Abdomen:  Soft, nontender with no  palpable pulsatile mass.  Femoral pulses are 2+.  Distal pulses are  diminished in both legs.   LABORATORY DATA:  CT scan is reviewed.  It shows a 4.8-cm ascending  aortic aneurysm which narrows as it approaches the arch.  The arch and  descending aorta are of normal caliber but do have significant  heavy  calcification throughout their course.  There is no evidence of  recurrent cancer.   IMPRESSION:  The patient is a 77 year old gentleman with a 4.8-cm  ascending aortic aneurysm.  I had a long discussion with him.  We  reviewed the films.  We discussed the indications for surgery which an  aneurysm in this area would be in the range of 5.5-6 cm or signs of  growth of more than 5 mm in 6 months to a year.  At this point in time,  I recommended to him that we not do surgery as this does not meet size  or growth characteristics.  There would be an indication for the  operation and the risk of surgery outweighs the risk of rupture or  dissection.  We did discuss those 2 potential calamities and he  understands that there is no guarantee that these could not occur, just  the risk in the short term is less than the risk of surgical  intervention.   His blood pressure is elevated today at 150/80.  He is on Cozaar.  I do  think he would benefit from a beta-blocker if he can tolerate it from a  symptomatic standpoint and talk him into trying low-dose Toprol-XL 25 mg  daily.  I will plan to see him back in 6 months with a CT angio of the  chest.   [name], [name].  Electronically Signed   SCH/MEDQ  Austin:  12/17/2008  T:  12/18/2008  Job:  [id number]   cc:   Mosetta Putt, [name].

## 2010-06-30 NOTE — Discharge Summary (Signed)
NAMEDERRON, PIPKINS NO.:  [id number]   MEDICAL RECORD NO.:  [id number]          PATIENT TYPE:  INP   LOCATION:  2504                         FACILITY:  [facility]   PHYSICIAN:  Verne Carrow, MDDATE OF BIRTH:  13-Jan-1931   DATE OF ADMISSION:  06/26/2008  DATE OF DISCHARGE:  06/27/2008                               DISCHARGE SUMMARY   DISCHARGE DIAGNOSIS:  Coronary artery disease, status post successful  percutaneous coronary intervention with Xience drug-eluting stent to the  distal right coronary artery.   SECONDARY DIAGNOSES:  1. Hyperlipidemia.  2. Diabetes mellitus type 2.  3. Benign prostatic hypertrophy.   ALLERGIES:  NKDA.   PROCEDURES PERFORMED DURING THIS HOSPITALIZATION:  1. EKG showing normal sinus rhythm at a rate of 64 bpm, T-wave      flattening in aVL and V3, otherwise no acute ST-T wave changes.  No      significant Q-waves, normal axis, no evidence of hypertrophy, PR      180, QRS 88, and QTc 398.  2. The cardiac catheterization performed on Jun 26, 2008, - successful      percutaneous coronary intervention with placement of a Xience drug-      eluting stent in the distal right coronary artery.  3. EKG performed on Jun 27, 2008, normal sinus rhythm with a rate of      64 bpm, T-wave flattening in aVL, otherwise no acute ST-T wave      changes, no significant Q-waves, normal axis, no evidence of      hypertrophy, PR 170, QRS 98, and QTc 408.   BRIEF HISTORY OF PRESENT ILLNESS:  Mr. Clabo had abnormal stress  Myoview completed on Jun 19, 2008, and the patient underwent cardiac  catheterization on Jun 21, 2008.  At that time, he was loaded with Plavix  and the plan was to have him return on Jun 26, 2008, for planned PCI to  the RCA.  He is admitted on Jun 26, 2008, for this reason.   HOSPITAL COURSE:  The patient admitted and underwent procedures as  described above.  He tolerated them well without any significant  complications.   Vital signs remained stable during his brief hospital  course.  Most recent vital signs on date of discharge, temp 97.5 degrees  Fahrenheit, BP 132/55, pulse 61, respiration rate 20, O2 saturation 95%  on room air.  The patient deemed stable for discharge in the morning of  Jun 27, 2008.  The patient will continue on his home medications  including full-dose aspirin, Plavix 75 mg, and lovastatin 20 mg p.o.  daily.  No beta-blocker now secondary to bradycardia.  The patient has  followup appointment already scheduled with Dr. Rollene Rotunda on Jul 02, 2008.  The patient will receive his medication list, prescription  for nitroglycerin, followup instructions, and his postcath instructions  at the time of discharge.  He should have no questions or concerns that  are not addressed at that time.   The patient enrolled in the ADAPT DES and the PARIS clinical trials.  The patient was explained all risks and  benefits of study participation  prior to signing informed consent forms.  The patient received copies of  signed informed consent forms prior to any study specific procedures  being completed.   DISCHARGE LABORATORY DATA:  WBC 6.6, HGB 14.7, HCT 43.0, and PLT count  146.  Sodium 140, potassium 4.3, chloride 107, CO2 27, BUN 10,  creatinine 0.75, glucose 122, and calcium 9.4.  Glucose while inpatient  ranged from 95-148.   FOLLOWUP PLANS AND APPOINTMENTS:  Please see hospital course.   DISCHARGE MEDICATIONS:  1. Plavix 75 mg p.o. daily.  2. Cozaar 50 mg p.o. daily.  3. Aspirin 325 mg p.o. daily.  4. Flomax 0.4 mg p.o. daily.  5. Metformin 500 mg p.o. b.i.d. to be resumed on Jun 29, 2008.  6. Meloxicam 15 mg p.o. p.r.n.  7. Lovastatin 20 mg p.o. daily.  8. Omeprazole 20 mg p.o. p.r.n.  9. Nitroglycerin 0.4 mg sublingual p.r.n. for chest pain.   Duration of discharge encounter including physician time was 35 minutes.      Jarrett Ables, [facility]      Verne Carrow, MD   Electronically Signed    MS/MEDQ  D:  06/27/2008  T:  06/27/2008  Job:  [id number]   cc:   Rollene Rotunda, MD, [facility]

## 2010-06-30 NOTE — Cardiovascular Report (Signed)
NAME:  COMPTON, BRIGANCE NO.:  [id number]   MEDICAL RECORD NO.:  [id number]          PATIENT TYPE:  INP   LOCATION:  2504                         FACILITY:  [facility]   PHYSICIAN:  Verne Carrow, MDDATE OF BIRTH:  1930-07-10   DATE OF PROCEDURE:  06/26/2008  DATE OF DISCHARGE:                            CARDIAC CATHETERIZATION   PRIMARY CARDIOLOGIST:  Rollene Rotunda, MD, [facility]   PRIMARY CARE PHYSICIAN:  Mosetta Putt, MD   PROCEDURES PERFORMED:  1. Percutaneous coronary intervention with placement of a Xience drug-      eluting stent in the distal right coronary artery.  2. Placement of an Angio-Seal femoral artery closure device.   OPERATOR:  Verne Carrow, MD   INDICATIONS:  Unstable angina in this 77 year old Caucasian male with  history of hyperlipidemia and diabetes mellitus.  Diagnostic left heart  catheterization was performed on Jun 21, 2008, and revealed a severe  stenosis of the distal right coronary artery.  The patient was loaded  with Plavix last week and was brought back in today is an outpatient for  his percutaneous coronary intervention.   PROCEDURE IN DETAIL:  The patient was brought into the main cardiac  catheterization laboratory at [facility].  Informed consent was  signed and placed on the chart.  The right groin was prepped and draped  in a sterile fashion.  Lidocaine 1% was used for local anesthesia.  A 6-  French sheath was inserted into the right femoral artery without  difficulty.  A 6-French JR-4 guiding catheter was used to selectively  engage the right coronary artery.  Several different views of the right  coronary artery were obtained with angiography.  An Angiomax bolus was  given and a drip was started.  Of note, the patient was given a rapid  bolus of Angiomax secondary to a problem with the infusion pump.  The  patient had no bleeding around the sheath during the case.  Once the ACT  was greater than 200,  I passed a Cougar intracoronary wire down into the  right coronary artery into the posterolateral branch.  I then passed a  2.5 x 12 mm balloon over the wire into the area of tightest stenosis.  This balloon was inflated to 10 atmospheres.  This balloon was removed  and the 2.75 x 15 mm Xience drug-eluting stent was then deployed in the  distal right coronary artery in the area of tightest stenosis.  A 3.0 x  12 mm noncompliant balloon was then inflated to 20 atmospheres inside  the stent in the distal right coronary artery.  The stenosis prior to  the intervention was 95%, following the intervention was 0%.  There was  TIMI III flow down the right coronary artery both before and after the  procedure.  There were no apparent complications during the procedure.  The patient was taken to the holding area in stable condition.   IMPRESSION:  Successful percutaneous coronary intervention with  placement of a Xience drug-eluting stent in the distal right coronary  artery.   RECOMMENDATIONS:  The patient continued on aspirin and Plavix  for at  least 1 year.  I will continue his statin medication and I will consider  starting beta-blocker prior to discharge.      Verne Carrow, MD  Electronically Signed     CM/MEDQ  D:  06/26/2008  T:  06/27/2008  Job:  [id number]   cc:   Rollene Rotunda, MD, [facility]  Mosetta Putt, M.D.

## 2010-06-30 NOTE — Letter (Signed)
December 17, 2008   Mosetta Putt, MD  [address] [place], [place] [address]   Re:  Brian Austin, Brian Austin              DOB:  [birth year]   Dear Theron Arista,   Thank you very much for referring the patient for evaluation.  It was a  pleasure to see him in the office today.  As you know, he is a 78-year-  old gentleman who Karle Plumber had done a right lower lobectomy in  2000 for squamous cell cancer and recently had stenting by Dr. Rollene Rotunda for coronary artery disease.  A recent CT scan showed an  aneurysm in his ascending thoracic aorta measuring 4.8 cm.  I met with  the patient and discussed this issue with him in the location of the  ascending aorta we would look for size of 5.5-6 cm as an indication for  surgery.  I have recommended to him that we follow this for now.  I did  talk to him about starting a beta-blocker.  He currently is on Cozaar  and I have added Toprol-XL 25 mg daily.  Hopefully, he can tolerate that  from a symptomatic standpoint, and I will plan to see him back again in  6 months with a CT angio of the chest.   I really appreciate the extensive medical records, they made my job much  easier.  Please do not hesitate to contact me at any time if I can be of  any further assistance in the future.   Sincerely,   Salvatore Decent. Dorris Fetch, M.D.  Electronically Signed   SCH/MEDQ  D:  12/17/2008  T:  12/18/2008  Job:  [id number]

## 2010-07-03 NOTE — Procedures (Signed)
[facility]  Patient:    Brian Austin, Brian Austin                     MRN: [id number] Proc. Date: 08/26/99 Adm. Date:  [id number] Disc. Date: [id number] Attending:  Orland Mustard CC:         Carolyne Fiscal, M.D.                           Procedure Report  PROCEDURE:  Colonoscopy with polypectomy.  MEDICATIONS:  Fentanyl 100 mcg, Versed 8.5 mg IV.  INDICATIONS:  Colon cancer screening.  SCOPE:  Adult Olympus video colonoscope.  DESCRIPTION OF PROCEDURE:  The procedure had been explained to the patient and consent obtained.  With the patient in the left lateral decubitus position, the adult Olympus video colonoscope was inserted and advanced under direct visualization.  The prep was excellent.  I was able to advance to the cecum. The ileocecal valve was seen after a short distance.  Just across from the cecum, a small 1 cm polyp on a slightly pedunculated stalk was seen and removed with the snare and sucked through the scope.  No other polyps were seen throughout the remainder of the cecum, transverse colon, descending, or sigmoid colon.  No significant diverticular disease.  Near the rectosigmoid junction, 0.5 cm sessile polyp was encountered and was removed with the snare and sucked through the scope.  No other polyps were seen.  There was no significant bleeding.  The patient was maintained on low-flow oxygen and pulse oximetry throughout the procedure with no obvious problem.  ASSESSMENT:  Colon polyps removed.  PLAN:  Will recommend repeating procedure in three years. DD:  08/26/99 TD:  08/26/99 Job: [id number]

## 2010-07-10 ENCOUNTER — Encounter: Payer: Self-pay | Admitting: Cardiology

## 2010-07-10 DIAGNOSIS — E785 Hyperlipidemia, unspecified: Secondary | ICD-10-CM | POA: Insufficient documentation

## 2010-07-10 DIAGNOSIS — I2510 Atherosclerotic heart disease of native coronary artery without angina pectoris: Secondary | ICD-10-CM | POA: Insufficient documentation

## 2010-07-10 DIAGNOSIS — R6884 Jaw pain: Secondary | ICD-10-CM | POA: Insufficient documentation

## 2010-07-10 DIAGNOSIS — E119 Type 2 diabetes mellitus without complications: Secondary | ICD-10-CM | POA: Insufficient documentation

## 2010-07-22 ENCOUNTER — Encounter: Payer: Self-pay | Admitting: Cardiology

## 2010-07-22 ENCOUNTER — Telehealth: Payer: Self-pay | Admitting: Cardiology

## 2010-07-22 ENCOUNTER — Ambulatory Visit (INDEPENDENT_AMBULATORY_CARE_PROVIDER_SITE_OTHER): Payer: Medicare Other | Admitting: Cardiology

## 2010-07-22 DIAGNOSIS — I2510 Atherosclerotic heart disease of native coronary artery without angina pectoris: Secondary | ICD-10-CM

## 2010-07-22 DIAGNOSIS — I714 Abdominal aortic aneurysm, without rupture: Secondary | ICD-10-CM

## 2010-07-22 DIAGNOSIS — E785 Hyperlipidemia, unspecified: Secondary | ICD-10-CM

## 2010-07-22 DIAGNOSIS — I10 Essential (primary) hypertension: Secondary | ICD-10-CM

## 2010-07-22 DIAGNOSIS — I259 Chronic ischemic heart disease, unspecified: Secondary | ICD-10-CM

## 2010-07-22 NOTE — Telephone Encounter (Signed)
Patient signed a ROI requesting records from [facility] - Dr. Idalia Needle, I faxed a copy to [facility] ([phone]) requesting all records on or around 07/2009.

## 2010-07-22 NOTE — Progress Notes (Signed)
HPI The patient presents for followup of his known coronary disease. Apparently since I last saw him he was hospitalized at the [facility] in [place] and was hospitalized with chest pain.  He reports 3 stents placed but I have no records. He did come off his Plavix about 6 weeks ago per their direction. He denies any of the jaw or arm discomfort he was having at that time and none of the chest discomfort he has had previously. He has had no palpitations, presyncope or syncope. He has had no new shortness of breath, PND or orthopnea. He has had no weight gain or edema.  He does get dyspnea with activities but is able to walk 300 yards uphill to his mailbox without excessive dyspnea.  No Known Allergies  Current Outpatient Prescriptions  Medication Sig Dispense Refill  . aspirin 81 MG tablet Take 81 mg by mouth daily.        Marland Kitchen losartan (COZAAR) 50 MG tablet Take 50 mg by mouth daily.        . Omega-3 Fatty Acids (FISH OIL PO) Take by mouth daily.        . Tamsulosin HCl (FLOMAX) 0.4 MG CAPS Take 0.4 mg by mouth.        . DISCONTD: clopidogrel (PLAVIX) 75 MG tablet Take 75 mg by mouth daily.          Past Medical History  Diagnosis Date  . GERD (gastroesophageal reflux disease)   . Arthritis   . COPD (chronic obstructive pulmonary disease) 05/22/2008    FEV1/FVS 44.9  . Squamous cell carcinoma lung 2000    CT w/ CM; 04/25/2009 COPD, s/p RLL, CAD, 5 cm ascending aortic aneurysm  . Spontaneous pneumothorax   . Diabetes mellitus     Well controlled  . BPH (benign prostatic hyperplasia)   . Hyperlipidemia   . Type II or unspecified type diabetes mellitus without mention of complication, not stated as uncontrolled   . Precordial pain   . Jaw pain   . CAD (coronary artery disease)     s/p Xience stent ot the distal RAC    Past Surgical History  Procedure Date  . Lobectomy 2000    Right lower for squamous cell ca  . Needle removed from the right foot   . Lumbar laminectomy 1978  . Lymph node  resected 1983    Axilla  . Spontaneous pneumothrax 1979  . Cardiac catheterization 2010    Stent    ROS:  As stated in the HPI and negative for all other systems.  PHYSICAL EXAM BP 124/63  Pulse 76  Resp 18  Ht 5\' 7"  (1.702 m)  Wt 176 lb (79.833 kg)  BMI 27.57 kg/m2 GENERAL:  Well appearing HEENT:  Pupils equal round and reactive, fundi not visualized, oral mucosa unremarkable, dentures NECK:  No jugular venous distention, waveform within normal limits, carotid upstroke brisk and symmetric, no bruits, no thyromegaly LYMPHATICS:  No cervical, inguinal adenopathy LUNGS:  Scattered bilateral expiratory wheezes BACK:  No CVA tenderness CHEST:  Unremarkable HEART:  PMI not displaced or sustained,S1 and S2 within normal limits, no S3, no S4, no clicks, no rubs, no murmurs ABD:  Flat, positive bowel sounds normal in frequency in pitch, no bruits, no rebound, no guarding, no midline pulsatile mass, no hepatomegaly, no splenomegaly EXT:  2 plus pulses throughout, no edema, no cyanosis no clubbing SKIN:  No rashes no nodules NEURO:  Cranial nerves II through XII grossly intact, motor grossly intact  throughout [facility]:  Cognitively intact, oriented to person place and time   EKG: Sinus rhythm, rate, axis within normal limits 66, intervals within normal limits, no acute ST-T wave changes.   ASSESSMENT AND PLAN

## 2010-07-22 NOTE — Assessment & Plan Note (Signed)
The patient has had no new symptoms. I will try to get the records from the [facility]. For now he will continue his risk reduction.

## 2010-07-22 NOTE — Patient Instructions (Signed)
Continue current medications Follow up 12 months with Dr Antoine Poche

## 2010-07-22 NOTE — Assessment & Plan Note (Signed)
He reports that his LDL and HDL are both low recently. He took himself off pravastatin. However, he agrees to restart this. He can have this followed by his primary provider.

## 2010-07-22 NOTE — Assessment & Plan Note (Signed)
The blood pressure is at target. No change in medications is indicated. We will continue with therapeutic lifestyle changes (TLC).  

## 2010-08-11 ENCOUNTER — Telehealth: Payer: Self-pay | Admitting: Cardiology

## 2010-08-11 NOTE — Telephone Encounter (Deleted)
ROI Faxed to [facility] @ [phone] 08/11/10/km  HIM Dept @ [facility] faxed paper back over Stating they have NO Records on this Pt. Will let Pam/Hochrein aware. 08/20/10/km  Faxed ROI over to [facility] @ [phone].Marland Kitchen Results for orders placed in visit on 04/25/09  CONVERTED [facility] LAB      Component Value Range   Sodium 140  135-145 (meq/L)   Potassium 4.8  3.5-5.1 (meq/L)   Chloride 104  96-112 (meq/L)   CO2 30  19-32 (meq/L)   Glucose, Bld 123 (*) 70-99 (mg/dL)   BUN 9  5-28 (mg/dL)   Creatinine, Ser 0.7  0.4-1.5 (mg/dL)   Calcium 9.5  4.1-32.4 (mg/dL)   GFR calc non Af Amer 116.28  >60 (mL/min)

## 2010-09-07 ENCOUNTER — Emergency Department (HOSPITAL_COMMUNITY): Payer: Medicare Other

## 2010-09-07 ENCOUNTER — Observation Stay (HOSPITAL_COMMUNITY)
Admission: EM | Admit: 2010-09-07 | Discharge: 2010-09-09 | Disposition: A | Discharge status: Home / Self Care | Payer: Medicare Other | Attending: Internal Medicine | Admitting: Internal Medicine

## 2010-09-07 ENCOUNTER — Encounter (HOSPITAL_COMMUNITY): Payer: Self-pay | Admitting: Radiology

## 2010-09-07 DIAGNOSIS — I712 Thoracic aortic aneurysm, without rupture, unspecified: Secondary | ICD-10-CM | POA: Insufficient documentation

## 2010-09-07 DIAGNOSIS — E119 Type 2 diabetes mellitus without complications: Secondary | ICD-10-CM | POA: Insufficient documentation

## 2010-09-07 DIAGNOSIS — J4489 Other specified chronic obstructive pulmonary disease: Secondary | ICD-10-CM | POA: Insufficient documentation

## 2010-09-07 DIAGNOSIS — Z9861 Coronary angioplasty status: Secondary | ICD-10-CM | POA: Insufficient documentation

## 2010-09-07 DIAGNOSIS — I059 Rheumatic mitral valve disease, unspecified: Secondary | ICD-10-CM | POA: Insufficient documentation

## 2010-09-07 DIAGNOSIS — N400 Enlarged prostate without lower urinary tract symptoms: Secondary | ICD-10-CM | POA: Insufficient documentation

## 2010-09-07 DIAGNOSIS — M47812 Spondylosis without myelopathy or radiculopathy, cervical region: Secondary | ICD-10-CM | POA: Insufficient documentation

## 2010-09-07 DIAGNOSIS — S139XXA Sprain of joints and ligaments of unspecified parts of neck, initial encounter: Principal | ICD-10-CM | POA: Insufficient documentation

## 2010-09-07 DIAGNOSIS — J449 Chronic obstructive pulmonary disease, unspecified: Secondary | ICD-10-CM | POA: Insufficient documentation

## 2010-09-07 DIAGNOSIS — M25519 Pain in unspecified shoulder: Secondary | ICD-10-CM | POA: Insufficient documentation

## 2010-09-07 DIAGNOSIS — X58XXXA Exposure to other specified factors, initial encounter: Secondary | ICD-10-CM | POA: Insufficient documentation

## 2010-09-07 DIAGNOSIS — D72829 Elevated white blood cell count, unspecified: Secondary | ICD-10-CM | POA: Insufficient documentation

## 2010-09-07 DIAGNOSIS — Z79899 Other long term (current) drug therapy: Secondary | ICD-10-CM | POA: Insufficient documentation

## 2010-09-07 DIAGNOSIS — K219 Gastro-esophageal reflux disease without esophagitis: Secondary | ICD-10-CM | POA: Insufficient documentation

## 2010-09-07 DIAGNOSIS — I10 Essential (primary) hypertension: Secondary | ICD-10-CM | POA: Insufficient documentation

## 2010-09-07 DIAGNOSIS — I2510 Atherosclerotic heart disease of native coronary artery without angina pectoris: Secondary | ICD-10-CM | POA: Insufficient documentation

## 2010-09-07 DIAGNOSIS — E785 Hyperlipidemia, unspecified: Secondary | ICD-10-CM | POA: Insufficient documentation

## 2010-09-07 HISTORY — DX: Essential (primary) hypertension: I10

## 2010-09-07 LAB — BASIC METABOLIC PANEL
BUN: 14 mg/dL (ref 6–23)
CO2: 27 mEq/L (ref 19–32)
Calcium: 9.6 mg/dL (ref 8.4–10.5)
Glucose, Bld: 134 mg/dL — ABNORMAL HIGH (ref 70–99)
Sodium: 139 mEq/L (ref 135–145)

## 2010-09-07 LAB — CBC
MCV: 87.4 fL (ref 78.0–100.0)
Platelets: 161 10*3/uL (ref 150–400)
RDW: 13.9 % (ref 11.5–15.5)
WBC: 12.2 10*3/uL — ABNORMAL HIGH (ref 4.0–10.5)

## 2010-09-07 LAB — URINALYSIS, ROUTINE W REFLEX MICROSCOPIC
Bilirubin Urine: NEGATIVE
Hgb urine dipstick: NEGATIVE
Specific Gravity, Urine: 1.008 (ref 1.005–1.030)
pH: 6.5 (ref 5.0–8.0)

## 2010-09-07 LAB — DIFFERENTIAL
Basophils Absolute: 0 10*3/uL (ref 0.0–0.1)
Basophils Relative: 0 % (ref 0–1)
Eosinophils Absolute: 0.1 10*3/uL (ref 0.0–0.7)
Eosinophils Relative: 0 % (ref 0–5)
Neutrophils Relative %: 82 % — ABNORMAL HIGH (ref 43–77)

## 2010-09-07 LAB — POCT I-STAT, CHEM 8
Creatinine, Ser: 0.7 mg/dL (ref 0.50–1.35)
Glucose, Bld: 133 mg/dL — ABNORMAL HIGH (ref 70–99)
Hemoglobin: 16 g/dL (ref 13.0–17.0)
Potassium: 4.3 mEq/L (ref 3.5–5.1)

## 2010-09-07 LAB — PROTIME-INR
INR: 0.91 (ref 0.00–1.49)
Prothrombin Time: 12.5 seconds (ref 11.6–15.2)

## 2010-09-07 LAB — CK TOTAL AND CKMB (NOT AT ARMC)
CK, MB: 4.1 ng/mL — ABNORMAL HIGH (ref 0.3–4.0)
Relative Index: 2.8 — ABNORMAL HIGH (ref 0.0–2.5)
Total CK: 168 U/L (ref 7–232)

## 2010-09-07 MED ORDER — IOHEXOL 350 MG/ML SOLN
50.0000 mL | Freq: Once | INTRAVENOUS | Status: AC | PRN
  Administered 2010-09-07: 50 mL via INTRAVENOUS

## 2010-09-07 MED ORDER — IOHEXOL 300 MG/ML  SOLN
50.0000 mL | Freq: Once | INTRAMUSCULAR | Status: AC | PRN
  Administered 2010-09-07: 50 mL via INTRAVENOUS

## 2010-09-08 ENCOUNTER — Observation Stay (HOSPITAL_COMMUNITY): Payer: Medicare Other

## 2010-09-08 LAB — CBC
MCH: 29.8 pg (ref 26.0–34.0)
MCV: 88.8 fL (ref 78.0–100.0)
Platelets: 150 10*3/uL (ref 150–400)
RDW: 14.1 % (ref 11.5–15.5)

## 2010-09-08 LAB — CK TOTAL AND CKMB (NOT AT ARMC)
CK, MB: 3 ng/mL (ref 0.3–4.0)
Total CK: 144 U/L (ref 7–232)

## 2010-09-09 LAB — BASIC METABOLIC PANEL
CO2: 27 mEq/L (ref 19–32)
GFR calc non Af Amer: >60 mL/min (ref >60)
Glucose, Bld: 125 mg/dL — ABNORMAL HIGH (ref 70–99)
Potassium: 4.3 mEq/L (ref 3.5–5.1)
Sodium: 136 mEq/L (ref 135–145)

## 2010-09-12 NOTE — H&P (Signed)
NAMEZACKARY, Austin NO.:  [id number]  MEDICAL RECORD NO.:  [id number]  LOCATION:  5529                         FACILITY:  [facility]  PHYSICIAN:  Lonia Blood, M.D.DATE OF BIRTH:  [birth year]  DATE OF ADMISSION:  09/07/2010 DATE OF DISCHARGE:                             HISTORY & PHYSICAL   PRIMARY CARE PHYSICIAN:  Carolyne Fiscal, MD  CHIEF COMPLAINT:  Left neck and shoulder pain.  HISTORY OF PRESENT ILLNESS:  [name]. 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  His previous episodes of angina had presented with jaw discomfort.  He states that he was awakened at 2:30 this morning with acute onset of severe posterior left neck pain.  This was not jaw pain.  This made it very difficult for him to turn his head.  He carried out his usual activities of the day upon getting out of bed later in the morning.  He has had no shortness of breath, diaphoresis, or chest pressure associated with this pain.  As the day had progressed, however, he noted the pain moving into his left shoulder.  As a result he presented to the ER for evaluation.  In the emergency room,, the patient has undergone a CT angio of the head, neck, and chest due to the fact that he has a known history of a thoracic aortic aneurysm.  These radiographic exams have proven that the aneurysm is stable at approximately 5 cm in diameter.  An EKG has revealed no acute changes and a first set of cardiac enzymes has been unrevealing.  The patient is resting in a hospital stretcher at the present time, but reports unrelenting pain in his left neck.  He denies inability to move the neck, but states that it simply worsens the pain. He has no paralysis, numbness, or paresthesias of the upper extremities. He has no loss of strength in the upper  extremities.  He denies hematemesis, hemoptysis, headache, fevers, chills, shortness of breath, diarrhea, or substernal chest pressure.  REVIEW OF SYSTEMS:  Comprehensive review of systems is unrevealing with the exception of multiple positive elements noted in the history of present illness above.  PAST MEDICAL HISTORY: 1. Diabetes mellitus type 2. 2. Hyperlipidemia. 3. Hypertension. 4. Coronary artery disease.     a.     Drug-eluting stent placed in the right coronary artery in      May 2010.     b.     Three additional stents placed in unspecified vessels in      [facility] in June 2011.     c.     Typical presentation of the patient's angina is that of jaw      discomfort. 5. Benign prostatic hypertrophy. 6. COPD. 7. 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 Karle Plumber. 8. Known 4.8 cm ascending aortic aneurysm followed by Dr. Dorris Fetch. 9. Hypertension. 10.Gastroesophageal reflux disease with reflux esophagitis. 11.Mild mitral valve regurgitation. 12.Status post lumbar  laminectomy.  OUTPATIENT MEDICATIONS: 1. Cozaar 50 mg daily. 2. Flomax 0.4 mg daily. 3. Plavix 75 mg daily. 4. Aspirin 81 mg daily. 5. Fish oil 2000 mg daily. 6. Vitamin E 400 units.  ALLERGIES:  NO KNOWN DRUG ALLERGIES.  FAMILY HISTORY:  Noncontributory.  SOCIAL HISTORY:  The patient is a retired Office manager.  He is single.  He lives in the [place] area by himself.  He quit smoking tobacco in 1986.  He occasionally takes alcohol, but not to excess.  DATA REVIEWED:  White count is elevated at 12.2.  MCV, hemoglobin, and platelet count are normal.  BMP is unremarkable except for an elevated serum glucose 134.  INR is 0.91.  Urinalysis is negative.  CK is 168, CK- MB is 5.1.  Troponin is less than 0.30.  EKG is sinus rhythm with occasional PVCs with a heart rate of 88 beats per minute.  CT angio of the head, neck, and chest reveals no  acute vascular disease of the head or neck, but does confirm the patient's thoracic aortic aneurysm measuring 5 cm in diameter.  Chest x-ray reveals no acute disease.  PHYSICAL EXAMINATION:  VITAL SIGNS:  Temperature 97.8, blood pressure 133/67, heart rate 73, respiratory rate 20, and O2 saturation is 95% on room air. GENERAL:  Well-developed, well-nourished gentleman in no acute respiratory stress. HEENT:  Normocephalic and atraumatic.  Pupils equal round and reactive to light and accommodation.  Extraocular muscles intact bilaterally. OC/OP clear. NECK:  No JVD. LUNGS:  Clear to auscultation bilaterally without wheeze or rhonchi. CARDIOVASCULAR:  Regular rate and rhythm without murmur, gallop, or rub. Normal S1 and S2. ABDOMEN:  Nontender, nondistended, and soft.  Bowel sounds present. No organomegaly, rebound, or ascites. EXTREMITIES:  No significant cyanosis, clubbing, or edema in the bilateral lower extremities. NEUROLOGIC:  Nonfocal and intact. MUSCULOSKELETAL:  There is pain when the patient attempts to turn his head to the right or to the extreme left.  There is no appreciable muscle spasm or muscular deformity on palpation.  IMPRESSION AND PLAN: 1. Neck pain.  This patient's neck pain appears to be most consistent     with a musculoskeletal source.  At this time, we will attempt pain     control using narcotics and muscle relaxants.  The main reason for     observation is the fact that the patient's previous episodes of     coronary disease have presented themselves as jaw pain.  While this     patient's pain is not in the jaw, an atypical presentation for     unstable angina pectoris should be considered in this patient.  We     will cycle cardiac enzymes and recheck a 12-lead EKG in the     morning.  If the patient's pain does not improve and if his cardiac     panels are unrevealing, it is not likely that further inpatient     evaluation will be required. 2. History  coronary disease.  As per my discussion above, I do feel it     is prudent to admit the patient for observation.  I am however, not     sufficiently concerned that the patient's pain is cardiac in origin     that I feel that he would require full anticoagulation or nitro     drip.  We will simply cycle cardiac enzymes and repeat a 12-lead     EKG in the morning.  If we find questionable results  in doing so,     we will consult his cardiologist, Dr. Daiva Nakayama for further     evaluation. 3. Hyperglycemia.  The patient has a reported history of diabetes     mellitus type 2, but is not on any agents and does not follow a     strict diet.  For now we will simply monitor the patient as an     inpatient without sliding scale insulin and with a repeat serum     glucose in the morning. 4. Hypertension.  We continued the patient's home blood pressure     regimen.  His blood pressure is not markedly elevated at the     present time. 5. Chronic obstructive pulmonary disease.  This appears to be well     compensated at the present time. 6. Hyperlipidemia.  The patient describes taking a medication     previously that he did not "do well with."  It sounds as if this     was a lipid-lowering agent.  We will continue his fish oil and not    attempt to add other agents at the present time.  I will check a     fasting lipid panel in the morning to determine if the patient's     cholesterol is severely uncontrolled. 7. A 4.8-cm ascending aortic aneurysm.  This appears to be stable on     CT of the chest and I do not feel that this is likely related to     the patient's symptoms. 8. Leukocytosis.  The exact etiology of the patient's leukocytosis is     not clear.  He does not have symptoms to suggest a meningitis.     There are no chest x-ray findings to suggest an occult infection.     His urinalysis is unrevealing.  This may simply be a stress     demargination.  We will not administer antibiotics for  now and we     will follow up a CBC in the morning.     Lonia Blood, M.D.     JTM/MEDQ  D:  09/07/2010  T:  09/07/2010  Job:  [id number]  cc:   Rollene Rotunda, MD, [facility] Mosetta Putt, M.D.  Electronically Signed by Jetty Duhamel M.D. on 09/12/2010 04:31:51 PM

## 2010-10-21 NOTE — Discharge Summary (Signed)
NAMEKWADWO, TARAS NO.:  [id number]  MEDICAL RECORD NO.:  [id number]  LOCATION:  5529                         FACILITY:  [facility]  PHYSICIAN:  Clydia Llano, MD       DATE OF BIRTH:  1930/11/26  DATE OF ADMISSION:  09/07/2010 DATE OF DISCHARGE:  09/09/2010                              DISCHARGE SUMMARY   PRIMARY CARE PHYSICIAN:  Mosetta Putt, MD and Dr. Serafina Royals, [facility]  DISCHARGE DIAGNOSES: 1. Severe neck pain secondary to cervical muscle strain and swelling. 2. History of coronary artery disease. 3. Hyperglycemia. 4. Chronic obstructive pulmonary disease. 5. Hyperlipidemia. 6. Approximately 5 cm ascending aortic aneurysm. 7. Leukocytosis.  CONDITION AT TIME OF DISCHARGE:  [name] is alert and oriented.  He is able to walk around the room without difficulty.  He is in a soft cervical collar for support.  He is requiring pain medications and muscle relaxants in order to be able to move about, however, he is up, able to take care himself, and ready for discharge this morning.  HISTORY OF PRESENT ILLNESS:  [name] is a 77 year old [employer] retiree who has a known history of coronary artery disease and has undergone placement of 4 cardiac stents between May of 2010 and June of 2011.  His previous episodes of angina presented with jaw discomfort. The morning of his admission, he awoke at 2:30 in the morning with acute onset of severe posterior left neck pain that made it very difficult for him to turn his head.  He carried on his usual activities that day but as the pain progressed and moved into his shoulder, he came to the emergency department for fear that it may be anginal equivalent.  In the emergency room, the patient underwent CT angio of his head, neck, and chest due to the fact he has known history of thoracic aortic aneurysm. These radiographic exams showed the aneurysm to be stable at approximately 5 cm in  diameter.  An EKG revealed no acute changes. Three sets of cardiac enzymes 8 hours apart were negative for any sign of acute coronary syndrome but because the patient was unable to move his neck and lived at home alone, he was kept in the hospital for observation and pain management.   During his stay, an MRI of his neck and thoracic spine were done to rule out any nerve involvement.  Results of the MRI were as follows. 1. Mild paraspinal muscular edema in the midline and to the left of     the midline at C4 through C6 levels most consistent with cervical     muscle strain.  There was no soft tissue, mass, lesion.  No     evidence of osseous metastasis. 2. Mid cervical spinal stenosis with bilateral foraminal stenosis and     mild central stenosis from C4-C5 through C6-C7. 3. Bone marrow edema with periarticular edema on the left at C4-C5 and     the right C5-C6 consistent with facet arthritis.  MRI of his     thoracic spine showed mildly exaggerated thoracic kyphosis.  No     thoracic stenosis or thoracic spine metastatic disease.  PHYSICAL EXAMINATION AT THE TIME OF DISCHARGE:  GENERAL:  [name] is awake, oriented, pleasant this morning. VITAL SIGNS:  Temperature 97.7, pulse 67, respirations 19, blood pressure 118/68, O2 sats 91% on room air. HEENT:  Head is atraumatic, normocephalic.  Eyes are anicteric with pupils equal, round, and reactive to light.  Nose shows no nasal discharge or exterior lesions. NECK:  Supple with midline trachea.  There is no lymphadenopathy.  There is JVD.  He has restricted range of motion and increased pain with any movement of his neck.  CHEST:  Demonstrates no accessory muscle use.  He has no wheezes or crackles to my exam. HEART:  Has regular rate and rhythm without obvious murmurs, rubs, or gallops. ABDOMEN:  Soft, nontender, nondistended with good bowel sounds. EXTREMITIES:  Show no clubbing, cyanosis, or edema.  He is able to move all  4 extremities and describes no tingling or neurologic symptoms. SKIN:  Shows no rashes, bruises, or lesions. PSYCHIATRIC:  The patient is alert and oriented.  His demeanor is pleasant, cooperative.  Grooming is excellent.  PERTINENT LABS:  As mentioned in the HPI, 3 sets of cardiac enzymes were negative for any sign of acute coronary syndrome.  He had a hemoglobin A1c during this hospitalization that was 6.1 with a mean plasma glucose of 128.  On July 25, his BMET is as follows:  Sodium 136, potassium 4.3, chloride 100, bicarb 27, glucose 125, BUN 14, creatinine 0.79, calcium 9.3. Urinary analysis done on admission was negative for any signs of infection.  RADIOLOGICAL DATA: 1. Chest x-ray done July 23 shows no acute cardiopulmonary     abnormalities. 2. CT angio of his chest with contrast done July 23 shows no acute     chest findings, negative for PE.  He has a stable enlargement of     the ascending thoracic aorta measuring up to 5 cm. 3. CT angio of his head, no acute finding relative to the brain.     Major vessels appear widely patent. 4. CT angio of his neck, no acute vascular pathology in the neck.     Atherosclerosis of the carotid bifurcation on the left with     diameter stenosis of the proximal ICA measuring 55%. Degenerative     spondylosis and facet arthropathy that could contribute to neck     pain.  DISCHARGE MEDICATIONS:  The patient will be discharged to home on: 1. Methocarbamol 500 mg 3 tablets by mouth as needed 4 times a day,     take for 20 days. 2. Oxycodone 5 mg, take 1-2 tablets by mouth every 6 hours as needed,     will receive a prescription for 20 days. 3. Aspirin 81 mg 1 tablet by mouth daily. 4. Cozaar 50 mg 1 tablet by mouth daily. 5. Flomax 0.4 mg 1 tablet by mouth daily at bedtime. 6. Omega-3 acid ethyl esters 1 gram by mouth twice daily before meals. 7. Pravachol 1 tablet by mouth daily.  The patient is to follow up with his primary care  physician, Dr. Mosetta Putt within the next 2 weeks, particularly if his neck pain becomes more severe.     Stephani Police, PA   ______________________________ Clydia Llano, MD    MLY/MEDQ  D:  09/09/2010  T:  09/09/2010  Job:  [id number]  cc:   Mosetta Putt, M.D. Serafina Royals, M.D. [facility]  Electronically Signed by Algis Downs PA on 09/10/2010 09:23:19 AM Electronically Signed by  Gethsemane Fischler  on 10/21/2010 02:28:48 PM

## 2010-10-21 NOTE — Discharge Summary (Signed)
  NAME:  Brian Austin, Brian Austin NO.:  [id number]  MEDICAL RECORD NO.:  [id number]  LOCATION:                                 FACILITY:  PHYSICIAN:  Clydia Llano, MD       DATE OF BIRTH:  1930-11-17  DATE OF ADMISSION: DATE OF DISCHARGE:                              DISCHARGE SUMMARY   ADDENDUM  [name] primary care physicians are Dr. Idalia Needle at the [facility] as well as Dr. Mosetta Putt.  Please change discharge medications to note that methocarbamol dose will be 500 mg q.4 h. p.r.n. muscle pain or spasm as opposed to 1500 mg of methocarbamol.     Stephani Police, PA   ______________________________ Clydia Llano, MD    MLY/MEDQ  D:  09/09/2010  T:  09/09/2010  Job:  [id number]  cc:   Dr. Idalia Needle at the [facility] Mosetta Putt, M.D.  Electronically Signed by Algis Downs PA on 09/10/2010 09:23:56 AM Electronically Signed by Clydia Llano  on 10/21/2010 02:28:57 PM

## 2010-12-04 ENCOUNTER — Other Ambulatory Visit: Payer: Self-pay | Admitting: Thoracic Surgery (Cardiothoracic Vascular Surgery)

## 2010-12-04 DIAGNOSIS — I712 Thoracic aortic aneurysm, without rupture: Secondary | ICD-10-CM

## 2010-12-18 ENCOUNTER — Encounter: Payer: Self-pay | Admitting: Thoracic Surgery (Cardiothoracic Vascular Surgery)

## 2010-12-24 ENCOUNTER — Encounter: Payer: Medicare Other | Admitting: Thoracic Surgery (Cardiothoracic Vascular Surgery)

## 2010-12-24 ENCOUNTER — Encounter: Payer: Self-pay | Admitting: Thoracic Surgery (Cardiothoracic Vascular Surgery)

## 2010-12-24 ENCOUNTER — Ambulatory Visit (INDEPENDENT_AMBULATORY_CARE_PROVIDER_SITE_OTHER): Payer: Medicare Other | Admitting: Thoracic Surgery (Cardiothoracic Vascular Surgery)

## 2010-12-24 ENCOUNTER — Other Ambulatory Visit: Payer: Medicare Other

## 2010-12-24 ENCOUNTER — Ambulatory Visit
Admission: RE | Admit: 2010-12-24 | Discharge: 2010-12-24 | Disposition: A | Discharge status: Home / Self Care | Payer: Medicare Other | Source: Ambulatory Visit | Attending: Thoracic Surgery (Cardiothoracic Vascular Surgery) | Admitting: Thoracic Surgery (Cardiothoracic Vascular Surgery)

## 2010-12-24 VITALS — BP 139/68 | HR 72 | Resp 20 | Ht 68.0 in | Wt 170.0 lb

## 2010-12-24 DIAGNOSIS — Z85118 Personal history of other malignant neoplasm of bronchus and lung: Secondary | ICD-10-CM

## 2010-12-24 DIAGNOSIS — I712 Thoracic aortic aneurysm, without rupture: Secondary | ICD-10-CM

## 2010-12-24 MED ORDER — IOHEXOL 300 MG/ML  SOLN
100.0000 mL | Freq: Once | INTRAMUSCULAR | Status: AC | PRN
  Administered 2010-12-24: 100 mL via INTRAVENOUS

## 2010-12-24 NOTE — Progress Notes (Signed)
HPI: [name] is a 77 year old gentleman who we've been following for an ascending aortic aneurysm since 2010. This is measured 4.7-4.8 cm on CT scan. He also has a history of non-small cell lung cancer and we were able to trace CT scans back to 2004 which showed his aorta was stable back to that time. He now returns for one year followup visit. He says that he is not having difficulty with chest pain, tightness or pressure. He does have some occasional shortness of breath related to his COPD. Otherwise his been doing well and has no complaints  Current Outpatient Prescriptions  Medication Sig Dispense Refill  . aspirin 81 MG tablet Take 81 mg by mouth daily.        Marland Kitchen losartan (COZAAR) 50 MG tablet Take 50 mg by mouth daily.        . Omega-3 Fatty Acids (FISH OIL PO) Take by mouth daily.        . Tamsulosin HCl (FLOMAX) 0.4 MG CAPS Take 0.4 mg by mouth.         No current facility-administered medications for this visit.   Facility-Administered Medications Ordered in Other Visits  Medication Dose Route Frequency Provider Last Rate Last Dose  . iohexol (OMNIPAQUE) 300 MG/ML injection 100 mL  100 mL Intravenous Once PRN Medication Radiologist   100 mL at 12/24/10 1337   Review of systems  Denies chest pain, positive exertional shortness of breath unchanged from baseline Weight stable, no change in bowel or bladder habits All of systems negative  Physical Exam: BP 139/68  Pulse 72  Resp 20  Ht 5\' 8"  (1.727 m)  Wt 170 lb (77.111 kg)  BMI 25.85 kg/m2  SpO2 95% Well-appearing 77 year old male in no acute distress Neurologic intact Cardiac exam regular rate and rhythm, normal S1 and S2, no murmur Lungs diminished breath sounds bilaterally, no wheezing  Diagnostic Tests: CT of the chest shows an ascending aortic aneurysm, measured 4.7 cm maximal diameter, unchanged from previous film.  Impression: 76 year old gentleman with 4.7 cm ascending aortic aneurysm, this is been stable and  there is no indication for surgery at this time.   Plan: Recommendation return in one year with repeat CT to followup the ascending aortic aneurysm Patient noticed a call us immediately if he would experience severe chest pain

## 2011-07-27 ENCOUNTER — Ambulatory Visit (INDEPENDENT_AMBULATORY_CARE_PROVIDER_SITE_OTHER): Payer: Medicare Other | Admitting: Cardiology

## 2011-07-27 ENCOUNTER — Encounter: Payer: Self-pay | Admitting: Cardiology

## 2011-07-27 VITALS — BP 132/70 | HR 65 | Ht 67.0 in | Wt 172.0 lb

## 2011-07-27 DIAGNOSIS — Z79899 Other long term (current) drug therapy: Secondary | ICD-10-CM

## 2011-07-27 DIAGNOSIS — E7800 Pure hypercholesterolemia, unspecified: Secondary | ICD-10-CM

## 2011-07-27 DIAGNOSIS — I2510 Atherosclerotic heart disease of native coronary artery without angina pectoris: Secondary | ICD-10-CM

## 2011-07-27 LAB — LIPID PANEL: Cholesterol: 155 mg/dL (ref 0–200)

## 2011-07-27 MED ORDER — PRAVASTATIN SODIUM 40 MG PO TABS: 40.0000 mg | ORAL_TABLET | Freq: Every evening | ORAL | Status: DC

## 2011-07-27 NOTE — Assessment & Plan Note (Signed)
This is stable and followed yearly by Dr. Dorris Fetch. I reviewed the CT results.

## 2011-07-27 NOTE — Assessment & Plan Note (Signed)
He has no ongoing symptoms. I will continue with aggressive risk reduction. I will try again to get records from the [facility] on his stenting from last year.

## 2011-07-27 NOTE — Progress Notes (Signed)
   HPI The patient presents for followup of his known coronary disease. Since I last saw him he has had no new cardiovascular complaints. He does hard and heavy yard work without bringing on any chest discomfort, jaw discomfort (which was his previous angina) or excessive shortness of breath. He denies any palpitations, presyncope or syncope. He has no PND or orthopnea. He did not restart pravastatin as we discussed because he was worried about not getting his liver enzymes checked.   No Known Allergies  Current Outpatient Prescriptions  Medication Sig Dispense Refill  . aspirin 81 MG tablet Take 81 mg by mouth daily.        Marland Kitchen DOXYCYCLINE HYCLATE PO Take 100 mg by mouth 2 (two) times daily.      Marland Kitchen glucosamine-chondroitin 500-400 MG tablet Take 3 tablets by mouth daily.      Marland Kitchen losartan (COZAAR) 50 MG tablet Take 50 mg by mouth daily.        . Omega-3 Fatty Acids (FISH OIL PO) Take by mouth daily.        . Tamsulosin HCl (FLOMAX) 0.4 MG CAPS Take 0.4 mg by mouth.          Past Medical History  Diagnosis Date  . GERD (gastroesophageal reflux disease)   . Arthritis   . COPD (chronic obstructive pulmonary disease) 05/22/2008    FEV1/FVS 44.9  . Squamous cell carcinoma lung 2000    CT w/ CM; 04/25/2009 COPD, s/p RLL, CAD, 5 cm ascending aortic aneurysm  . Spontaneous pneumothorax   . Diabetes mellitus     Well controlled  . BPH (benign prostatic hyperplasia)   . Hyperlipidemia   . Type II or unspecified type diabetes mellitus without mention of complication, not stated as uncontrolled   . Precordial pain   . Jaw pain   . CAD (coronary artery disease)     s/p Xience stent ot the distal RAC  . Hypertension     Past Surgical History  Procedure Date  . Lobectomy 2000    Right lower for squamous cell ca  . Needle removed from the right foot   . Lumbar laminectomy 1978  . Lymph node resected 1983    Axilla  . Spontaneous pneumothrax 1979  . Cardiac catheterization 2010    Stent     ROS:  As stated in the HPI and negative for all other systems.  PHYSICAL EXAM BP 132/70  Pulse 65  Ht 5\' 7"  (1.702 m)  Wt 172 lb (78.019 kg)  BMI 26.94 kg/m2 GENERAL:  Well appearing HEENT:  Pupils equal round and reactive, fundi not visualized, oral mucosa unremarkable, dentures NECK:  No jugular venous distention, waveform within normal limits, carotid upstroke brisk and symmetric, no bruits, no thyromegaly LYMPHATICS:  No cervical, inguinal adenopathy LUNGS:  Scattered bilateral expiratory wheezes BACK:  No CVA tenderness CHEST:  Unremarkable HEART:  PMI not displaced or sustained,S1 and S2 within normal limits, no S3, no S4, no clicks, no rubs, no murmurs ABD:  Flat, positive bowel sounds normal in frequency in pitch, no bruits, no rebound, no guarding, no midline pulsatile mass, no hepatomegaly, no splenomegaly EXT:  2 plus pulses throughout, no edema, no cyanosis no clubbing    EKG: Sinus rhythm, rate, axis within normal limits 68, intervals within normal limits, no acute ST-T wave changes.  07/27/2011   ASSESSMENT AND PLAN

## 2011-07-27 NOTE — Patient Instructions (Signed)
Follow up in 1 year with Dr Antoine Poche.  You will receive a letter in the mail 2 months before you are due.  Please call us when you receive this letter to schedule your follow up appointment.  Please have blood work today and again in 8 weeks. (lipid today and lipid and liver in 8 weeks)  Start Pravastatin 40 mg daily Continue all other medications as listed

## 2011-07-27 NOTE — Assessment & Plan Note (Signed)
The blood pressure is at target. No change in medications is indicated. We will continue with therapeutic lifestyle changes (TLC).  

## 2011-07-27 NOTE — Assessment & Plan Note (Signed)
I have convinced him again to start pravastatin and I will right prescription for 40 mg daily. He'll get lipids today and a lipid and liver in 8 weeks.

## 2011-08-06 ENCOUNTER — Encounter: Payer: Self-pay | Admitting: Cardiology

## 2011-08-09 ENCOUNTER — Emergency Department (HOSPITAL_COMMUNITY)
Admission: EM | Admit: 2011-08-09 | Discharge: 2011-08-09 | Disposition: A | Discharge status: Home / Self Care | Payer: Medicare Other | Attending: Emergency Medicine | Admitting: Emergency Medicine

## 2011-08-09 ENCOUNTER — Encounter (HOSPITAL_COMMUNITY): Payer: Self-pay | Admitting: *Deleted

## 2011-08-09 DIAGNOSIS — E785 Hyperlipidemia, unspecified: Secondary | ICD-10-CM | POA: Insufficient documentation

## 2011-08-09 DIAGNOSIS — S61209A Unspecified open wound of unspecified finger without damage to nail, initial encounter: Secondary | ICD-10-CM | POA: Insufficient documentation

## 2011-08-09 DIAGNOSIS — E119 Type 2 diabetes mellitus without complications: Secondary | ICD-10-CM | POA: Insufficient documentation

## 2011-08-09 DIAGNOSIS — Z9861 Coronary angioplasty status: Secondary | ICD-10-CM | POA: Insufficient documentation

## 2011-08-09 DIAGNOSIS — J4489 Other specified chronic obstructive pulmonary disease: Secondary | ICD-10-CM | POA: Insufficient documentation

## 2011-08-09 DIAGNOSIS — I10 Essential (primary) hypertension: Secondary | ICD-10-CM | POA: Insufficient documentation

## 2011-08-09 DIAGNOSIS — J449 Chronic obstructive pulmonary disease, unspecified: Secondary | ICD-10-CM | POA: Insufficient documentation

## 2011-08-09 DIAGNOSIS — R55 Syncope and collapse: Secondary | ICD-10-CM | POA: Insufficient documentation

## 2011-08-09 DIAGNOSIS — K219 Gastro-esophageal reflux disease without esophagitis: Secondary | ICD-10-CM | POA: Insufficient documentation

## 2011-08-09 DIAGNOSIS — W540XXA Bitten by dog, initial encounter: Secondary | ICD-10-CM | POA: Insufficient documentation

## 2011-08-09 DIAGNOSIS — Z87891 Personal history of nicotine dependence: Secondary | ICD-10-CM | POA: Insufficient documentation

## 2011-08-09 DIAGNOSIS — I2510 Atherosclerotic heart disease of native coronary artery without angina pectoris: Secondary | ICD-10-CM | POA: Insufficient documentation

## 2011-08-09 DIAGNOSIS — Z4800 Encounter for change or removal of nonsurgical wound dressing: Secondary | ICD-10-CM | POA: Insufficient documentation

## 2011-08-09 NOTE — ED Notes (Signed)
Pt brought in by [facility] for hypotension at the MD office. Pt was being seen for a dog bite to his right 5th finger yesterday. Was receiving an injection of Rocephin 1 gram IM and became dizzy and felt nauseous. Started IV and called EMS. Pt states that he feels fine now. No hypotension while with EMS. Pt has had constant bleeding since being bitten yesterday. States that it was his puppy and he has had his shots.

## 2011-08-09 NOTE — Discharge Instructions (Signed)
Animal Bite Animal bite wounds can get infected. It is important to get proper medical treatment. Ask your doctor if you need a rabies shot. HOME CARE   Follow your doctor's instructions for taking care of your wound.   Only take medicine as told by your doctor.   Take your medicine (antibiotics) as told. Finish them even if you start to feel better.   Keep all doctor visits as told.  You may need a tetanus shot if:   You cannot remember when you had your last tetanus shot.   You have never had a tetanus shot.   The injury broke your skin.  If you need a tetanus shot and you choose not to have one, you may get tetanus. Sickness from tetanus can be serious. GET HELP RIGHT AWAY IF:   Your wound is warm, red, sore, or puffy (swollen).   You notice yellowish-white fluid (pus) or a bad smell coming from the wound.   You see a red line on the skin coming from the wound.   You have a fever, chills, or you feel sick.   You feel sick to your stomach (nauseous), or you throw up (vomit).   Your pain does not go away, or it gets worse.   You have trouble moving the injured part.   You have questions or concerns.  MAKE SURE YOU:   Understand these instructions.   Will watch your condition.   Will get help right away if you are not doing well or get worse.  Document Released: 02/01/2005 Document Revised: 01/21/2011 Document Reviewed: 09/23/2010 [facility] Patient Information 2012 [place], [place].   Take antibiotic as erect again as prescribed by your as well [facility]. Keep an eye on a dull observe the dog the dog becomes ill notify you that or the dog disappear she'll probably need rabies vaccination series. Wound care as per your primary care Dr. Return for any new or worse symptoms return for any recurrent episodes of passing out or almost passing out.

## 2011-08-09 NOTE — ED Provider Notes (Signed)
History     CSN: [id number]  Arrival date & time 08/09/11  2028   First MD Initiated Contact with Patient 08/09/11 2106      Chief Complaint  Patient presents with  . Animal Bite     HPI Pt was seen at 2105.  Per pt, c/o gradual onset and persistence of constant need for dressing change on dog bite right 5th finger.  Pt states his puppy bit him on his right 5th finger yesterday, but was eval by his PMD for same with wound care and abx given earlier today.  Pt was eval in the ED earlier today for vasovagal near syncope after receiving IM rocephin in the PMD ofc.  Pt states he "feels fine" now, and that he is "just here to get the dressing changed."  Endorses he already has scheduled PMD follow up for this. Denies new injury, no focal motor weakness, no erythema at site or streaking up hand/arm, no edema, no fevers, no purulent drainage.     Past Medical History  Diagnosis Date  . GERD (gastroesophageal reflux disease)   . Arthritis   . COPD (chronic obstructive pulmonary disease) 05/22/2008    FEV1/FVS 44.9  . Squamous cell carcinoma lung 2000    CT w/ CM; 04/25/2009 COPD, s/p RLL, CAD, 5 cm ascending aortic aneurysm  . Spontaneous pneumothorax   . Diabetes mellitus     Well controlled  . BPH (benign prostatic hyperplasia)   . Hyperlipidemia   . Type II or unspecified type diabetes mellitus without mention of complication, not stated as uncontrolled   . Precordial pain   . Jaw pain   . CAD (coronary artery disease)     s/p Xience stent ot the distal RAC 2010, PCI with Xience stent to distal RCA at the [facility] 2011  . Hypertension   . Thoracic ascending aortic aneurysm     4.8 cm 2011    Past Surgical History  Procedure Date  . Lobectomy 2000    Right lower for squamous cell ca  . Needle removed from the right foot   . Lumbar laminectomy 1978  . Lymph node resected 1983    Axilla  . Spontaneous pneumothrax 1979  . Cardiac catheterization 2010    Stent  . Back surgery       Family History  Problem Relation Age of Onset  . Heart disease Neg Hx   . Diabetes Father   . Alcohol abuse Father     History  Substance Use Topics  . Smoking status: Former Smoker    Quit date: 02/16/1984  . Smokeless tobacco: Not on file   Comment: Smoked for 30 years  . Alcohol Use: Yes     Hx of alcohol use      Review of Systems ROS: Statement: All systems negative except as marked or noted in the HPI; Constitutional: Negative for fever and chills. ; ; Eyes: Negative for eye pain, redness and discharge. ; ; ENMT: Negative for ear pain, hoarseness, nasal congestion, sinus pressure and sore throat. ; ; Cardiovascular: Negative for chest pain, palpitations, diaphoresis, dyspnea and peripheral edema. ; ; Respiratory: Negative for cough, wheezing and stridor. ; ; Gastrointestinal: Negative for nausea, vomiting, diarrhea, abdominal pain, blood in stool, hematemesis, jaundice and rectal bleeding. . ; ; Genitourinary: Negative for dysuria, flank pain and hematuria. ; ; Musculoskeletal: Negative for back pain and neck pain. Negative for swelling and trauma.; ; Skin: Negative for pruritus, rash, abrasions, blisters, bruising and +skin  lesion.; ; Neuro: Negative for headache, lightheadedness and neck stiffness. Negative for weakness, altered level of consciousness , altered mental status, extremity weakness, paresthesias, involuntary movement, seizure and syncope.       Allergies  Review of patient's allergies indicates no known allergies.  Home Medications   Current Outpatient Rx  Name Route Sig Dispense Refill  . UNKNOWN TO PATIENT Oral Take 1 tablet by mouth 2 (two) times daily. ANTIBIOTIC: NAME UNKNOWN    . ASPIRIN EC 81 MG PO TBEC Oral Take 81 mg by mouth daily.    . OMEGA-3 FATTY ACIDS 1000 MG PO CAPS Oral Take 1 g by mouth 3 (three) times daily.    Marland Kitchen GLUCOSAMINE-CHONDROITIN 500-400 MG PO TABS Oral Take 3 tablets by mouth daily.    Marland Kitchen LOSARTAN POTASSIUM 50 MG PO TABS Oral  Take 50 mg by mouth daily.      . OXYCODONE-ACETAMINOPHEN 5-325 MG PO TABS Oral Take 1 tablet by mouth every 6 (six) hours as needed. For pain    . PRAVASTATIN SODIUM 40 MG PO TABS Oral Take 1 tablet (40 mg total) by mouth every evening. 90 tablet 3  . TAMSULOSIN HCL 0.4 MG PO CAPS Oral Take 0.4 mg by mouth daily.       BP 139/90  Pulse 80  Temp 98.3 F (36.8 C) (Oral)  Resp 20  Ht 5\' 7"  (1.702 m)  Wt 165 lb (74.844 kg)  BMI 25.84 kg/m2  SpO2 97%  Physical Exam 2110: Physical examination:  Nursing notes reviewed; Vital signs and O2 SAT reviewed;  Constitutional: Well developed, Well nourished, Well hydrated, In no acute distress; Head:  Normocephalic, atraumatic; Eyes: EOMI, PERRL, No scleral icterus; ENMT: Mouth and pharynx normal, Mucous membranes moist; Neck: Supple, Full range of motion, No lymphadenopathy; Cardiovascular: Regular rate and rhythm, No murmur, rub, or gallop; Respiratory: Breath sounds clear & equal bilaterally, No rales, rhonchi, wheezes.  Speaking full sentences with ease, Normal respiratory effort/excursion; Chest: Nontender, Movement normal;; Extremities: Pulses normal, No tenderness, No edema, No calf edema or asymmetry.; Neuro: AA&Ox3, Major CN grossly intact.  Speech clear. No gross focal motor or sensory deficits in extremities.; Skin: Color normal, Warm, Dry, +right palmar distal 5th digit tip with a small blood clot, no active bleeding, no drainage, no ecchymosis, no erythema.    ED Course  Procedures   MDM  MDM Reviewed: nursing note, vitals and previous chart      9:49 PM:  Pt has been eval by his PMD and in ED today x2 for same.  Pt's Td is UTD, already is taking abx, and has scheduled f/u with his PMD for this injury.  Pt endorses he had what sounds like either quick-clot, thrombi-pad, or gelfoam placed on the wound in the ofc to stop the bleeding.  No active bleeding from finger currently, DSD reapplied.  Pt wants to go home now and agrees to f/u with  his PMD as previously arranged.        Laray Anger, DO 08/12/11 1953

## 2011-08-09 NOTE — ED Provider Notes (Signed)
History  This chart was scribed for Shelda Jakes, MD by Bennett Scrape. This patient was seen in room APA09/APA09 and the patient's care was started at 12:09PM.  CSN: [id number]  Arrival date & time 08/09/11  1056   First MD Initiated Contact with Patient 08/09/11 1209      Chief Complaint  Patient presents with  . Hypotension    The history is provided by the patient. No language interpreter was used.   Brian Austin is a 78 y.o. male  brought in by [facility] for hypotension at his PCP's office. Pt states that he was being seen for a dog bite to his right 5th finger. Pt states that he was bite by his own dog (part beagle/bull dog) after he had grazed the dog with his truck yesterday around 5PM. He states that the dog is vaccinated. He reports that he bandaged the bite up and went to see his PCP today. He was receiving an injection of Rocephin 1 gram IM when he became dizzy and felt nauseous. He reports that PCP started IV, put clotting powder on the wound and bandaged it up before EMS arrived. He states that he was prescribed an antibiotic and plans to follow up with his PCP for the wound care. He reports that he feels fine now. He is not hypotensive in the ED. He denies fever, nausea, rash, HA, chest pain and dysuria as associated symptoms. He has a h/o GERD, COPD, DM, HTN and CAD. He is a former smoker and former alcohol user.  PCP is Dr. Jorene Guest.  Past Medical History  Diagnosis Date  . GERD (gastroesophageal reflux disease)   . Arthritis   . COPD (chronic obstructive pulmonary disease) 05/22/2008    FEV1/FVS 44.9  . Squamous cell carcinoma lung 2000    CT w/ CM; 04/25/2009 COPD, s/p RLL, CAD, 5 cm ascending aortic aneurysm  . Spontaneous pneumothorax   . Diabetes mellitus     Well controlled  . BPH (benign prostatic hyperplasia)   . Hyperlipidemia   . Type II or unspecified type diabetes mellitus without mention of complication, not stated as uncontrolled   .  Precordial pain   . Jaw pain   . CAD (coronary artery disease)     s/p Xience stent ot the distal RAC 2010, PCI with Xience stent to distal RCA at the [facility] 2011  . Hypertension   . Thoracic ascending aortic aneurysm     4.8 cm 2011    Past Surgical History  Procedure Date  . Lobectomy 2000    Right lower for squamous cell ca  . Needle removed from the right foot   . Lumbar laminectomy 1978  . Lymph node resected 1983    Axilla  . Spontaneous pneumothrax 1979  . Cardiac catheterization 2010    Stent    Family History  Problem Relation Age of Onset  . Heart disease Neg Hx   . Diabetes Father   . Alcohol abuse Father     History  Substance Use Topics  . Smoking status: Former Smoker    Quit date: 02/16/1984  . Smokeless tobacco: Not on file   Comment: Smoked for 30 years  . Alcohol Use: Yes     Hx of alcohol use      Review of Systems  Constitutional: Negative for fever and chills.  HENT: Negative for congestion, sore throat and neck pain.   Eyes: Negative for visual disturbance.  Respiratory: Negative for cough  and shortness of breath.   Cardiovascular: Negative for chest pain.  Gastrointestinal: Negative for nausea, vomiting, abdominal pain and diarrhea.  Genitourinary: Negative for dysuria.  Musculoskeletal: Negative for back pain.  Skin: Positive for wound (dog bite to the right 5th finger). Negative for rash.  Neurological: Negative for headaches.  Hematological: Does not bruise/bleed easily.  Psychiatric/Behavioral: Negative for confusion.    Allergies  Review of patient's allergies indicates no known allergies.  Home Medications   Current Outpatient Rx  Name Route Sig Dispense Refill  . ASPIRIN EC 81 MG PO TBEC Oral Take 81 mg by mouth daily.    . OMEGA-3 FATTY ACIDS 1000 MG PO CAPS Oral Take 1 g by mouth 3 (three) times daily.    Marland Kitchen GLUCOSAMINE-CHONDROITIN 500-400 MG PO TABS Oral Take 3 tablets by mouth daily.    Marland Kitchen LOSARTAN POTASSIUM 50 MG PO  TABS Oral Take 50 mg by mouth daily.      . OXYCODONE-ACETAMINOPHEN 5-325 MG PO TABS Oral Take 1 tablet by mouth every 6 (six) hours as needed. For pain    . PRAVASTATIN SODIUM 40 MG PO TABS Oral Take 1 tablet (40 mg total) by mouth every evening. 90 tablet 3  . TAMSULOSIN HCL 0.4 MG PO CAPS Oral Take 0.4 mg by mouth daily.       Triage Vitals: BP 132/58  Pulse 76  Temp 98.1 F (36.7 C) (Oral)  Resp 18  Ht 5\' 7"  (1.702 m)  Wt 165 lb (74.844 kg)  BMI 25.84 kg/m2  SpO2 100%  Physical Exam  Nursing note and vitals reviewed. Constitutional: He is oriented to person, place, and time. He appears well-developed and well-nourished. No distress.  HENT:  Head: Normocephalic and atraumatic.       Moist mucus membranes  Eyes: EOM are normal. No scleral icterus.  Neck: Neck supple. No tracheal deviation present.  Cardiovascular: Normal rate and regular rhythm.   No murmur heard. Pulmonary/Chest: Effort normal and breath sounds normal. No respiratory distress.  Abdominal: Soft. Bowel sounds are normal. There is no tenderness.  Musculoskeletal: Normal range of motion. He exhibits no edema.       Pt can move all extremities, bandage on right pinky finger  Lymphadenopathy:    He has no cervical adenopathy.  Neurological: He is alert and oriented to person, place, and time. No cranial nerve deficit.  Skin: Skin is warm and dry.  Psychiatric: He has a normal mood and affect. His behavior is normal.    ED Course  Procedures (including critical care time)  DIAGNOSTIC STUDIES: Oxygen Saturation is 100% on room air, normal by my interpretation.    COORDINATION OF CARE: 12:43PM-Discussed vaso vagal response with pt and pt understood the diagnosis. Discussed discharge instructions with pt and pt agreed to plan. 1:18PM-Pt ambulated around the ED without any difficulties, so I will discharge him home.  Labs Reviewed - No data to display No results found.   1. Vasovagal near syncope   2. Dog  bite       MDM   Patient status post a dog bite to right little finger yesterday went to his primary care doctor's office today had wound care on the finger and got a shot of Rocephin as an antibiotic at home to take for the dog bite. VACCINATIONS are up-to-date it is his dog and he can observe it. The dog bit him but after being injured. Patient in the office when getting the antibiotic and shortly after the  wound care got the feeling lightheaded placement pass out he checked his blood pressure was low they called EMS and transported him here by time EMS got there an IV established he had some fluids and his blood pressure was fine and has remained fine. Hearing emergent Gertie Gowda asymptomatic sitting up also walked around the emergency department without any symptoms. Symptoms consistent with a near syncopal vasovagal episode the dog bite and wound care and followup of that will be done by his primary care Dr. Prophylactic antibiotics as been ordered by his primary care Dr. Patient states his tetanus is up-to-date.     I personally performed the services described in this documentation, which was scribed in my presence. The recorded information has been reviewed and considered.     Shelda Jakes, MD 08/09/11 1322

## 2011-08-09 NOTE — ED Notes (Signed)
Family at bedside. 

## 2011-08-09 NOTE — ED Notes (Signed)
Patient is comfortable at this time would like his IV removed. Patient walked to the restroom with no problems. Verlon Au aware.

## 2011-08-09 NOTE — ED Notes (Signed)
Pt has dressing in place right 5th finger with small amount dark red blood noted on end of dressing. Pt states that it has a pad with medicine on it that is supposed to stop the bleeding. Pt alert and oriented x 3. Skin warm and dry. Color pink. Breath sounds clear and equal bilaterally. IV  Infusing with no edema or redness.

## 2011-08-09 NOTE — Discharge Instructions (Signed)
RESOURCE GUIDE  Chronic Pain Problems: Contact [facility]  [phone] Patients need to be referred by their primary care doctor.  Insufficient Money for Medicine: Contact United Way:  call "211" or [facility] [phone].  No Primary Care Doctor: - Call [facility]  [phone] - can help you locate a primary care doctor that  accepts your insurance, provides certain services, etc. - Physician Referral Service- 1-800-533-3463  Agencies that provide inexpensive medical care: - [facility]  [phone] - [facility]  [phone] - [facility]  [phone] - Women's Clinic  [phone] - Planned Parenthood  [phone] - [facility]  [phone]  Medicaid-accepting [place] Providers: - [facility]- [address]  [phone], Mon-Fri 9am-7pm, Sat 9am-1pm - [facility]- [address], [address]  [phone] - [facility]- [address], [address]  [phone] - [facility]- [address] [place] [address]  [phone] - Veita Bland- [address], [phone]  Only accepts [place] Access Medicaid patients after they have their name  applied to their card  Self Pay (no insurance) in [place]: - Sickle Cell Patients: Dr Eric Dean, [facility]  [address], [phone] - [facility]- [address]  [phone]       -     [facility] [place]- [address]       -     [facility]- see information above (Speak to Pam H if you do not have insurance)       -  [facility]- [address], [phone]       -  [facility] [place]- [address],  [phone]       -  [facility]- [address] [place] [address], [phone]       -  Dr Osei-Bonsu-  [address], [place], [phone]       -  [facility]- [address], [phone]       -  [facility]- [address] [place] [address], [phone], also [address], [phone]       -    [facility]- [address], [phone], 1st & 3rd Saturday   every month, 10am-1pm  1) Find a Doctor and Pay Out of Pocket Although you won't have to find out who is covered by your insurance plan, it is a good idea to ask around and get recommendations. You will then need to call the office and see if the doctor you have chosen will accept you as a new patient and what types of options they offer for patients who are self-pay. Some doctors offer discounts or will set up payment plans for their patients who do not have insurance, but you will need to ask so you aren't surprised when you get to your appointment.  2) Contact Your Local Health Department Not all health departments have doctors that can see patients for sick visits, but many do, so it is worth a call to see if yours does. If you don't know where your local health department is, you can check in your phone book. The CDC also has a tool to help you locate your state's health department, and many state websites also have   listings of all of their local health departments.  3) Find a Walk-in Clinic If your illness is not likely to be very severe or complicated, you may want to try a walk in clinic. These are popping up all over the country in pharmacies, drugstores, and shopping centers. They're usually staffed by nurse practitioners or physician assistants that have been trained to treat common illnesses and complaints. They're usually fairly quick and inexpensive. However, if you have serious medical issues or chronic medical problems, these are probably not your best option  STD Testing - [place] [facility] [place], [facility] Clinic, [address], [place], phone [phone] or 1-877-539-9860.  Monday - Friday, call for an appointment. - [place]  [facility] [place], STD Clinic, [address], [place], phone [phone] or 1-877-539-9860.  Monday - Friday, call for an appointment.  Abuse/Neglect: - [place] Child Abuse Hotline (336) 641-3795 - [place] Child Abuse Hotline 800-378-5315 (After Hours)  Emergency Shelter:  [facility] (336) 271-5985  Maternity Homes: - [facility] (336) 275-9566 - [facility] (704) 372-4663  MRSA Hotline #:   832-7006  [place] Resources  [facility] [place]  [employer] [place] [facility] [address]                 [address]         [address]  [place]                                               [place]                              [place] Phone:  [phone]                                  Phone:  [phone]                   Phone:  [phone]  [place] [facility], [phone] - [place] Services - [facility]- 1-888-581-9988       -     [facility] in [place], [address],                                  336-349-4454, Insurance  [place] Child Abuse Hotline (336) 342-1394 or (336) 342-3537 (After Hours)   Behavioral Health Services  Substance Abuse Resources: - [facility]  336-882-2125 - [facility] 336-784-9470 - [facility] 336-285-9073 - [facility] 336-845-3988 - Residential & Outpatient Substance Abuse Program  800-659-3381  Psychological Services: -  Health  [phone] - [facility]  [phone] - [place] [facility], [address], [place], ACCESS LINE: 1-800-853-5163 or 336-641-4981, Http://www.[facility].com/services/adult.htm  Dental Assistance  If unable to pay or uninsured, contact:  [facility] or [place] [facility] to become qualified for the adult dental  clinic.  Patients with Medicaid:  [facility] [address], [phone], [phone]  If unable   to pay, or uninsured, contact [facility] 640-618-3612) or [facility] Department (818) 256-4101 in [place], [phone] in [facility]) to become qualified for the adult dental clinic  Other Low-Cost Community Dental Services: - [facility]- [address] [place], [place], [place], [address], [phone], [address]. [address], 2nd and 4th Thursday of the month at 6:30am.  10 clients each day by appointment, can sometimes see walk-in patients if someone does not show for an appointment. [facility]- [address] Ether Griffins [place], [place], [address], [phone] - [facility]- [address], [place], [place], [address], [phone] [facility] Health Department- [phone] [facility] Health Department- [phone] [facility] [phone]     Take your usual prescriptions as previously directed.  Change the dressing as per your family doctor's instructions.  Call your regular medical doctor tomorrow morning to schedule a follow up appointment for a recheck within the next 24 hours.  Return to the Emergency Department immediately if worsening.

## 2011-08-09 NOTE — ED Notes (Signed)
Dog bite to rt little finger, seen here for same.  Returned because began bleeding and needs dsg  Change.  Alert, NAD.  Dog is up to date with  Rabies shots.

## 2011-08-17 ENCOUNTER — Encounter: Payer: Self-pay | Admitting: *Deleted

## 2011-09-20 ENCOUNTER — Emergency Department (HOSPITAL_COMMUNITY): Payer: Medicare Other

## 2011-09-20 ENCOUNTER — Emergency Department (HOSPITAL_COMMUNITY)
Admission: EM | Admit: 2011-09-20 | Discharge: 2011-09-20 | Disposition: A | Discharge status: Home / Self Care | Payer: Medicare Other | Attending: Emergency Medicine | Admitting: Emergency Medicine

## 2011-09-20 ENCOUNTER — Encounter (HOSPITAL_COMMUNITY): Payer: Self-pay

## 2011-09-20 DIAGNOSIS — W1809XA Striking against other object with subsequent fall, initial encounter: Secondary | ICD-10-CM | POA: Insufficient documentation

## 2011-09-20 DIAGNOSIS — I10 Essential (primary) hypertension: Secondary | ICD-10-CM | POA: Insufficient documentation

## 2011-09-20 DIAGNOSIS — E119 Type 2 diabetes mellitus without complications: Secondary | ICD-10-CM | POA: Insufficient documentation

## 2011-09-20 DIAGNOSIS — N400 Enlarged prostate without lower urinary tract symptoms: Secondary | ICD-10-CM | POA: Insufficient documentation

## 2011-09-20 DIAGNOSIS — J449 Chronic obstructive pulmonary disease, unspecified: Secondary | ICD-10-CM | POA: Insufficient documentation

## 2011-09-20 DIAGNOSIS — E785 Hyperlipidemia, unspecified: Secondary | ICD-10-CM | POA: Insufficient documentation

## 2011-09-20 DIAGNOSIS — S20219A Contusion of unspecified front wall of thorax, initial encounter: Secondary | ICD-10-CM | POA: Insufficient documentation

## 2011-09-20 DIAGNOSIS — J4489 Other specified chronic obstructive pulmonary disease: Secondary | ICD-10-CM | POA: Insufficient documentation

## 2011-09-20 DIAGNOSIS — K219 Gastro-esophageal reflux disease without esophagitis: Secondary | ICD-10-CM | POA: Insufficient documentation

## 2011-09-20 DIAGNOSIS — I2510 Atherosclerotic heart disease of native coronary artery without angina pectoris: Secondary | ICD-10-CM | POA: Insufficient documentation

## 2011-09-20 DIAGNOSIS — Z87891 Personal history of nicotine dependence: Secondary | ICD-10-CM | POA: Insufficient documentation

## 2011-09-20 NOTE — ED Notes (Signed)
Fell Thursday, injuring left side rib cage, cont. To have pain, hard to take deep breath

## 2011-09-20 NOTE — ED Provider Notes (Signed)
History  This chart was scribed for Carleene Cooper III, MD by Bennett Scrape. This patient was seen in room APA07/APA07 and the patient's care was started at 9:27AM.  CSN: [id number]  Arrival date & time 09/20/11  0901   First MD Initiated Contact with Patient 09/20/11 [phone]      Chief Complaint  Patient presents with  . Fall  . Rib Injury    The history is provided by the patient. No language interpreter was used.    Brian Austin is a 78 y.o. male who presents to the Emergency Department complaining of 4 days of left anterior rib cage pain that radiates into the upper left flank after a fall. Pt states that he slipped in mud while pushing his lawn mower into his garage, hitting his left chest on the handle bar. The pain is worse with deep breathing. He reports taking oxycodone with mild improvement in symptoms. He denies having prior episodes of similar symptoms. He denies fever, sore throat, visual disturbance, cough, SOB, abdominal pain, nausea, emesis, diarrhea, urinary symptoms, back pain, HA, weakness, numbness and rash as associated symptoms. He has a h/o GERD, COPD, DM and CAD. He has a h/o aortic aneurysm but denies recent changes on CT scan from November 2012. He is an occasional alcohol user and former smoker   Pt is seen at the [place].  Past Medical History  Diagnosis Date  . GERD (gastroesophageal reflux disease)   . Arthritis   . COPD (chronic obstructive pulmonary disease) 05/22/2008    FEV1/FVS 44.9  . Squamous cell carcinoma lung 2000    CT w/ CM; 04/25/2009 COPD, s/p RLL, CAD, 5 cm ascending aortic aneurysm  . Spontaneous pneumothorax   . Diabetes mellitus     Well controlled  . BPH (benign prostatic hyperplasia)   . Hyperlipidemia   . Type II or unspecified type diabetes mellitus without mention of complication, not stated as uncontrolled   . Precordial pain   . Jaw pain   . CAD (coronary artery disease)     s/p Xience stent ot the distal RAC 2010, PCI with  Xience stent to distal RCA at the [facility] 2011  . Hypertension   . Thoracic ascending aortic aneurysm     4.8 cm 2011    Past Surgical History  Procedure Date  . Lobectomy 2000    Right lower for squamous cell ca  . Needle removed from the right foot   . Lumbar laminectomy 1978  . Lymph node resected 1983    Axilla  . Spontaneous pneumothrax 1979  . Cardiac catheterization 2010    Stent  . Back surgery     Family History  Problem Relation Age of Onset  . Heart disease Neg Hx   . Diabetes Father   . Alcohol abuse Father     History  Substance Use Topics  . Smoking status: Former Smoker    Quit date: 02/16/1984  . Smokeless tobacco: Not on file   Comment: Smoked for 30 years  . Alcohol Use: Yes     Hx of alcohol use      Review of Systems  Constitutional: Negative for fever and chills.  Respiratory: Negative for cough and shortness of breath.   Cardiovascular: Positive for chest pain (left rib cage).  Gastrointestinal: Negative for nausea, vomiting, abdominal pain and diarrhea.  Musculoskeletal: Negative for back pain.  All other systems reviewed and are negative.    Allergies  Review of patient's allergies indicates no  known allergies.  Home Medications   Current Outpatient Rx  Name Route Sig Dispense Refill  . ASPIRIN EC 81 MG PO TBEC Oral Take 81 mg by mouth daily.    . OMEGA-3 FATTY ACIDS 1000 MG PO CAPS Oral Take 1 g by mouth 3 (three) times daily.    Marland Kitchen LOSARTAN POTASSIUM 50 MG PO TABS Oral Take 50 mg by mouth daily.     Marland Kitchen PRAVASTATIN SODIUM 40 MG PO TABS Oral Take 1 tablet (40 mg total) by mouth every evening. 90 tablet 3  . TAMSULOSIN HCL 0.4 MG PO CAPS Oral Take 0.4 mg by mouth daily.     . OXYCODONE-ACETAMINOPHEN 5-325 MG PO TABS Oral Take 1 tablet by mouth every 6 (six) hours as needed. For pain      Triage Vitals: BP 132/51  Pulse 66  Temp 98.1 F (36.7 C) (Oral)  Resp 20  Ht 5' 7.5" (1.715 m)  Wt 165 lb (74.844 kg)  BMI 25.46 kg/m2  SpO2  100%  Physical Exam  Nursing note and vitals reviewed. Constitutional: He is oriented to person, place, and time. He appears well-developed and well-nourished. No distress.  HENT:  Head: Normocephalic and atraumatic.  Eyes: Conjunctivae and EOM are normal.  Neck: Neck supple. No tracheal deviation present.  Cardiovascular: Normal rate, regular rhythm and normal heart sounds.   Pulmonary/Chest: Effort normal and breath sounds normal. No respiratory distress. He exhibits tenderness (tenderness to left 6th and 8th anterior ribs, no crepitance, subcutaneous emphysema ).  Abdominal: Soft. There is no tenderness.  Musculoskeletal: Normal range of motion.  Neurological: He is alert and oriented to person, place, and time.  Skin: Skin is warm and dry.  Psychiatric: He has a normal mood and affect. His behavior is normal.    ED Course  Procedures (including critical care time)  DIAGNOSTIC STUDIES: Oxygen Saturation is 100% on room air, normal by my interpretation.    COORDINATION OF CARE: 9:47AM-Discussed treatment plan which includes CXR with pt at bedside and pt agreed to plan. 9:55AM-Informed pt of negative CXR report and gave pt a copy of the CXR. Discussed discharge plan with pt and pt agreed to plan. He turned down pain medications.    Labs Reviewed - No data to display Dg Chest 2 View  09/20/2011  *RADIOLOGY REPORT*  Clinical Data: Fall, left sided pain.  CHEST - 2 VIEW  Comparison: 09/07/2010  Findings: Scarring noted at the right lung base, stable.  Heart is normal size.  Left lung is clear.  No effusions or acute bony abnormality.  IMPRESSION: No acute cardiopulmonary disease.  No change.  Original Report Authenticated By: Cyndie Chime, M.D.     1. Chest wall contusion      I personally performed the services described in this documentation, which was scribed in my presence. The recorded information has been reviewed and considered.  Osvaldo Human, MD     Carleene Cooper III, MD 09/20/11 1000

## 2011-10-08 ENCOUNTER — Emergency Department (HOSPITAL_COMMUNITY): Payer: Medicare Other

## 2011-10-08 ENCOUNTER — Emergency Department (HOSPITAL_COMMUNITY)
Admission: EM | Admit: 2011-10-08 | Discharge: 2011-10-08 | Disposition: A | Discharge status: Home / Self Care | Payer: Medicare Other | Attending: Emergency Medicine | Admitting: Emergency Medicine

## 2011-10-08 ENCOUNTER — Encounter (HOSPITAL_COMMUNITY): Payer: Self-pay | Admitting: Emergency Medicine

## 2011-10-08 DIAGNOSIS — J449 Chronic obstructive pulmonary disease, unspecified: Secondary | ICD-10-CM | POA: Insufficient documentation

## 2011-10-08 DIAGNOSIS — Z7982 Long term (current) use of aspirin: Secondary | ICD-10-CM | POA: Insufficient documentation

## 2011-10-08 DIAGNOSIS — Z79899 Other long term (current) drug therapy: Secondary | ICD-10-CM | POA: Insufficient documentation

## 2011-10-08 DIAGNOSIS — K5730 Diverticulosis of large intestine without perforation or abscess without bleeding: Secondary | ICD-10-CM | POA: Insufficient documentation

## 2011-10-08 DIAGNOSIS — I2510 Atherosclerotic heart disease of native coronary artery without angina pectoris: Secondary | ICD-10-CM | POA: Insufficient documentation

## 2011-10-08 DIAGNOSIS — E119 Type 2 diabetes mellitus without complications: Secondary | ICD-10-CM | POA: Insufficient documentation

## 2011-10-08 DIAGNOSIS — R109 Unspecified abdominal pain: Secondary | ICD-10-CM

## 2011-10-08 DIAGNOSIS — K219 Gastro-esophageal reflux disease without esophagitis: Secondary | ICD-10-CM | POA: Insufficient documentation

## 2011-10-08 DIAGNOSIS — J4489 Other specified chronic obstructive pulmonary disease: Secondary | ICD-10-CM | POA: Insufficient documentation

## 2011-10-08 DIAGNOSIS — I714 Abdominal aortic aneurysm, without rupture, unspecified: Secondary | ICD-10-CM | POA: Insufficient documentation

## 2011-10-08 DIAGNOSIS — I10 Essential (primary) hypertension: Secondary | ICD-10-CM | POA: Insufficient documentation

## 2011-10-08 DIAGNOSIS — R110 Nausea: Secondary | ICD-10-CM | POA: Insufficient documentation

## 2011-10-08 LAB — CBC
HCT: 46.4 % (ref 39.0–52.0)
Hemoglobin: 15.5 g/dL (ref 13.0–17.0)
MCH: 30.2 pg (ref 26.0–34.0)
MCHC: 33.4 g/dL (ref 30.0–36.0)
MCV: 90.3 fL (ref 78.0–100.0)
Platelets: 165 10*3/uL (ref 150–400)
RBC: 5.14 MIL/uL (ref 4.22–5.81)
RDW: 13.5 % (ref 11.5–15.5)
WBC: 6.7 10*3/uL (ref 4.0–10.5)

## 2011-10-08 LAB — COMPREHENSIVE METABOLIC PANEL
ALT: 26 U/L (ref 0–53)
AST: 21 U/L (ref 0–37)
Albumin: 4.2 g/dL (ref 3.5–5.2)
Alkaline Phosphatase: 59 U/L (ref 39–117)
BUN: 16 mg/dL (ref 6–23)
CO2: 28 mEq/L (ref 19–32)
Calcium: 10 mg/dL (ref 8.4–10.5)
Chloride: 100 mEq/L (ref 96–112)
Creatinine, Ser: 0.74 mg/dL (ref 0.50–1.35)
GFR calc Af Amer: >90 mL/min (ref >90)
GFR calc non Af Amer: 85 mL/min — ABNORMAL LOW (ref >90)
Glucose, Bld: 112 mg/dL — ABNORMAL HIGH (ref 70–99)
Potassium: 4.3 mEq/L (ref 3.5–5.1)
Sodium: 138 mEq/L (ref 135–145)
Total Bilirubin: 0.4 mg/dL (ref 0.3–1.2)
Total Protein: 7.5 g/dL (ref 6.0–8.3)

## 2011-10-08 LAB — URINALYSIS, ROUTINE W REFLEX MICROSCOPIC
Bilirubin Urine: NEGATIVE
Glucose, UA: NEGATIVE mg/dL
Hgb urine dipstick: NEGATIVE
Ketones, ur: NEGATIVE mg/dL
Leukocytes, UA: NEGATIVE
Nitrite: NEGATIVE
Protein, ur: NEGATIVE mg/dL
Specific Gravity, Urine: 1.01 (ref 1.005–1.030)
Urobilinogen, UA: 0.2 mg/dL (ref 0.0–1.0)
pH: 6 (ref 5.0–8.0)

## 2011-10-08 LAB — LIPASE, BLOOD: Lipase: 32 U/L (ref 11–59)

## 2011-10-08 MED ORDER — DICYCLOMINE HCL 20 MG PO TABS: 20.0000 mg | ORAL_TABLET | Freq: Four times a day (QID) | ORAL | Status: DC | PRN

## 2011-10-08 MED ORDER — SODIUM CHLORIDE 0.9 % IV BOLUS (SEPSIS)
1000.0000 mL | Freq: Once | INTRAVENOUS | Status: AC
  Administered 2011-10-08: 1000 mL via INTRAVENOUS

## 2011-10-08 MED ORDER — IOHEXOL 300 MG/ML  SOLN
100.0000 mL | Freq: Once | INTRAMUSCULAR | Status: AC | PRN
  Administered 2011-10-08: 100 mL via INTRAVENOUS

## 2011-10-08 NOTE — ED Notes (Signed)
Pt c/o left rib/luq pain. Pt states he fell x 3 weeks ago and had xray of left ribs that was negative. Pt states pain has gotten worse instead of better and pt has had some nausea. Denies v/d. Denies cp/sob. nad noted.

## 2011-10-08 NOTE — ED Provider Notes (Signed)
History   This chart was scribed for Brian Razor, MD by Toya Smothers. The patient was seen in room APA14/APA14. Patient's care was started at 0827.  CSN: [id number]  Arrival date & time 10/08/11  0827   First MD Initiated Contact with Patient 10/08/11 0840      Chief Complaint  Patient presents with  . Abdominal Pain   Patient is a 78 y.o. male presenting with abdominal pain. The history is provided by the patient. No language interpreter was used.  Abdominal Pain The primary symptoms of the illness include abdominal pain and nausea. The primary symptoms of the illness do not include shortness of breath, vomiting, diarrhea or dysuria.  Symptoms associated with the illness do not include urgency, hematuria or frequency.    Brian Austin is a 78 y.o. male with a past medical history of pulmonary cancer and cardiac catheization who presents to the ED complaining of LUQ Abdominal Pain onset 3 weeks ago after a fall. Typically healthy at baseline the gradual worsening of abdominal pain represents a moderate deviation. Pain is waxing and waning, sharp, constant, moderate, and increasing acutely. Aggravated with movement, deep breathing, and rest in prone position, pain is similarly to that of previous episodes of DVT. Pt endorses associated nausea onset this morning. Denies emesis, diarrhea, frequency, and dysuria.   Past Medical History  Diagnosis Date  . GERD (gastroesophageal reflux disease)   . Arthritis   . COPD (chronic obstructive pulmonary disease) 05/22/2008    FEV1/FVS 44.9  . Squamous cell carcinoma lung 2000    CT w/ CM; 04/25/2009 COPD, s/p RLL, CAD, 5 cm ascending aortic aneurysm  . Spontaneous pneumothorax   . Diabetes mellitus     Well controlled  . BPH (benign prostatic hyperplasia)   . Hyperlipidemia   . Type II or unspecified type diabetes mellitus without mention of complication, not stated as uncontrolled   . Precordial pain   . Jaw pain   . CAD (coronary artery  disease)     s/p Xience stent ot the distal RAC 2010, PCI with Xience stent to distal RCA at the [facility] 2011  . Hypertension   . Thoracic ascending aortic aneurysm     4.8 cm 2011    Past Surgical History  Procedure Date  . Lobectomy 2000    Right lower for squamous cell ca  . Needle removed from the right foot   . Lumbar laminectomy 1978  . Lymph node resected 1983    Axilla  . Spontaneous pneumothrax 1979  . Cardiac catheterization 2010    Stent  . Back surgery     Family History  Problem Relation Age of Onset  . Heart disease Neg Hx   . Diabetes Father   . Alcohol abuse Father     History  Substance Use Topics  . Smoking status: Former Smoker    Quit date: 02/16/1984  . Smokeless tobacco: Not on file   Comment: Smoked for 30 years  . Alcohol Use: Yes     Hx of alcohol use   Review of Systems  Respiratory: Negative for shortness of breath.   Gastrointestinal: Positive for nausea and abdominal pain. Negative for vomiting, diarrhea and blood in stool.  Genitourinary: Negative for dysuria, urgency, frequency and hematuria.  All other systems reviewed and are negative.    Allergies  Review of patient's allergies indicates no known allergies.  Home Medications   Current Outpatient Rx  Name Route Sig Dispense Refill  . ASPIRIN  EC 81 MG PO TBEC Oral Take 81 mg by mouth daily.    . OMEGA-3 FATTY ACIDS 1000 MG PO CAPS Oral Take 1-2 g by mouth 2 (two) times daily. Take 1 capsule every morning and 2 capsules every evening    . GUAIFENESIN ER 600 MG PO TB12 Oral Take 1,200 mg by mouth as needed. For congestion    . LOSARTAN POTASSIUM 50 MG PO TABS Oral Take 50 mg by mouth daily.     Marland Kitchen PRAVASTATIN SODIUM 40 MG PO TABS Oral Take 1 tablet (40 mg total) by mouth every evening. 90 tablet 3  . TAMSULOSIN HCL 0.4 MG PO CAPS Oral Take 0.4 mg by mouth daily.       BP 138/54  Pulse 66  Temp 98.2 F (36.8 C)  Resp 18  Ht 5' 7.5" (1.715 m)  Wt 165 lb (74.844 kg)  BMI 25.46  kg/m2  SpO2 99%  Physical Exam  Nursing note and vitals reviewed. Constitutional: He appears well-developed and well-nourished. No distress.  HENT:  Head: Normocephalic and atraumatic.  Right Ear: External ear normal.  Left Ear: External ear normal.  Eyes: Conjunctivae are normal. Right eye exhibits no discharge. Left eye exhibits no discharge. No scleral icterus.  Neck: Neck supple. No tracheal deviation present.  Cardiovascular: Normal rate.   Pulmonary/Chest: Effort normal. No stridor. No respiratory distress. He has wheezes (faint blilaterally.). He has no rales.  Abdominal: Soft. He exhibits no mass. There is no rebound and no guarding.       LUQ tenderness.  Musculoskeletal: He exhibits no edema.  Neurological: He is alert. Cranial nerve deficit: no gross deficits.  Skin: Skin is warm and dry. No rash noted.  Psychiatric: He has a normal mood and affect.    ED Course  Procedures (including critical care time) DIAGNOSTIC STUDIES: Oxygen Saturation is 98% on room air, normal by my interpretation.    COORDINATION OF CARE: 0843- Evaluated Pt. Pt is without distress, awake, alert, and oriented. 0960- Ordered CT Abdomen Pelvis W Contrast Once. 4540- Ordered Urinalysis, Routine w reflex microscopic Once. 9811- Ordered Lipase, blood STAT. 0853- Ordered CBC STAT. 0853- Ordered Comprehensive metabolic panel STAT. 0853- Ordered ED EKG Once. 9147- Ordered EKG 12-Lead Once. 0900- Ordered sodium chloride 0.9 % bolus 1,000 mL Once.  Labs Reviewed  COMPREHENSIVE METABOLIC PANEL - Abnormal; Notable for the following:    Glucose, Bld 112 (*)     GFR calc non Af Amer 85 (*)     All other components within normal limits  URINALYSIS, ROUTINE W REFLEX MICROSCOPIC  LIPASE, BLOOD  CBC   Ct Abdomen Pelvis W Contrast  10/08/2011  *RADIOLOGY REPORT*  Clinical Data: Left side abdominal pain for 3 weeks, history of diverticulitis  CT ABDOMEN AND PELVIS WITH CONTRAST  Technique:   Multidetector CT imaging of the abdomen and pelvis was performed following the standard protocol during bolus administration of intravenous contrast. Sagittal and coronal MPR images reconstructed from axial data set.  Contrast: OMNIPAQUE IOHEXOL 300 MG/ML  SOLN Dilute oral contrast.  Comparison: 02/08/2009  Findings: Lung bases clear. Atherosclerotic calcifications aorta and coronary arteries. Question coronary arterial stents. Liver, spleen, pancreas, and adrenal glands normal appearance. Small cyst of lower poles of both kidneys. Unremarkable bladder and ureters. Mild prostatic enlargement.  Appendix not identified; no pericecal inflammatory process identified. Scattered diverticulosis of descending/sigmoid colon without evidence of diverticulitis. No mass, adenopathy, free fluid or inflammatory process. Scattered normal-sized mesenteric lymph nodes seen. No hernia  or acute osseous findings. Degenerative disc disease changes lumbar spine.  IMPRESSION: Distal colonic diverticulosis without definite evidence of acute diverticulitis. Small bilateral renal cysts.   Original Report Authenticated By: Lollie Marrow, M.D.    EKG:  Rhythm: normal sinus w/ sinus arrhythmia Rate: 71 Axis: normal Intervals/Conduction: normal ST segments: NS ST changes.    1. Abdominal pain       MDM  81yM with abdominal pain. Fairly benign exam. W/u including labs and CT a/p without significant findings. Low suspicion for emergent etiology. Plan symptomatic tx. Return precautions discussed. Outpt fu.     I personally preformed the services scribed in my presence. The recorded information has been reviewed and considered. Brian Razor, MD.     Brian Razor, MD 10/08/11 1057

## 2011-12-07 ENCOUNTER — Other Ambulatory Visit: Payer: Self-pay | Admitting: Thoracic Surgery (Cardiothoracic Vascular Surgery)

## 2011-12-07 DIAGNOSIS — I712 Thoracic aortic aneurysm, without rupture: Secondary | ICD-10-CM

## 2011-12-25 LAB — BUN: BUN: 16 mg/dL (ref 6–23)

## 2011-12-28 ENCOUNTER — Ambulatory Visit
Admission: RE | Admit: 2011-12-28 | Discharge: 2011-12-28 | Disposition: A | Discharge status: Home / Self Care | Payer: Medicare Other | Source: Ambulatory Visit | Attending: Thoracic Surgery (Cardiothoracic Vascular Surgery) | Admitting: Thoracic Surgery (Cardiothoracic Vascular Surgery)

## 2011-12-28 ENCOUNTER — Ambulatory Visit (INDEPENDENT_AMBULATORY_CARE_PROVIDER_SITE_OTHER): Payer: Medicare Other | Admitting: Thoracic Surgery (Cardiothoracic Vascular Surgery)

## 2011-12-28 ENCOUNTER — Encounter: Payer: Self-pay | Admitting: Thoracic Surgery (Cardiothoracic Vascular Surgery)

## 2011-12-28 VITALS — BP 129/72 | HR 67 | Resp 18 | Ht 68.0 in | Wt 169.0 lb

## 2011-12-28 DIAGNOSIS — I712 Thoracic aortic aneurysm, without rupture: Secondary | ICD-10-CM

## 2011-12-28 MED ORDER — IOHEXOL 350 MG/ML SOLN
80.0000 mL | Freq: Once | INTRAVENOUS | Status: AC | PRN
  Administered 2011-12-28: 80 mL via INTRAVENOUS

## 2011-12-28 NOTE — Progress Notes (Signed)
HPI:  Brian Austin returns today for one year followup. He is a 78 year old gentleman with a known ascending aortic aneurysm of 4.8 cm. I first saw him for this aneurysm in November of 2010, and have been following it since then with annual CT scans.  Since his last visit he states that he's been doing well. He's not had any chest pain or shortness of breath. He's not had any further cardiac issues. He denies chest pain, tightness or pressure. He denies shortness of breath and orthopnea. He is a former smoker but quit in 1986.  Past Medical History  Diagnosis Date  . GERD (gastroesophageal reflux disease)   . Arthritis   . COPD (chronic obstructive pulmonary disease) 05/22/2008    FEV1/FVS 44.9  . Squamous cell carcinoma lung 2000    CT w/ CM; 04/25/2009 COPD, s/p RLL, CAD, 5 cm ascending aortic aneurysm  . Spontaneous pneumothorax   . Diabetes mellitus     Well controlled  . BPH (benign prostatic hyperplasia)   . Hyperlipidemia   . Type II or unspecified type diabetes mellitus without mention of complication, not stated as uncontrolled   . Precordial pain   . Jaw pain   . CAD (coronary artery disease)     s/p Xience stent ot the distal RAC 2010, PCI with Xience stent to distal RCA at the [facility] 2011  . Hypertension   . Thoracic ascending aortic aneurysm     4.8 cm 2011    Current Outpatient Prescriptions  Medication Sig Dispense Refill  . aspirin EC 81 MG tablet Take 81 mg by mouth daily.      . fish oil-omega-3 fatty acids 1000 MG capsule Take 1-2 g by mouth 2 (two) times daily. Take 1 capsule every morning and 2 capsules every evening      . guaiFENesin (MUCINEX) 600 MG 12 hr tablet Take 1,200 mg by mouth as needed. For congestion      . losartan (COZAAR) 50 MG tablet Take 50 mg by mouth daily.       . pravastatin (PRAVACHOL) 40 MG tablet Take 1 tablet (40 mg total) by mouth every evening.  90 tablet  3  . Tamsulosin HCl (FLOMAX) 0.4 MG CAPS Take 0.4 mg by mouth daily.         No current facility-administered medications for this visit.   Facility-Administered Medications Ordered in Other Visits  Medication Dose Route Frequency Provider Last Rate Last Dose  . [COMPLETED] iohexol (OMNIPAQUE) 350 MG/ML injection 80 mL  80 mL Intravenous Once PRN Medication Radiologist, MD   80 mL at 12/28/11 1155    Physical Exam BP 129/72  Pulse 67  Resp 18  Ht 5\' 8"  (1.727 m)  Wt 169 lb (76.658 kg)  BMI 25.70 kg/m2  SpO19 53% 78 year old male in no acute distress General well-developed well-nourished Neuro alert and oriented x3 with no focal deficits No cervical or supraclavicular adenopathy Cardiac regular rate and rhythm normal S1 and S2, 2/6 systolic murmur Lungs clear with equal breath sounds No peripheral edema  Diagnostic Tests:  CT angio of the chest 12/28/2011 Technique: Multidetector CT imaging of the chest using the  standard protocol during bolus administration of intravenous  contrast. Multiplanar reconstructed images including MIPs were  obtained and reviewed to evaluate the vascular anatomy.  Contrast: 80mL OMNIPAQUE IOHEXOL 350 MG/ML SOLN  Comparison: CT 12/24/2010  Findings: The ascending aorta measures 47 mm by 48 mm compared to  47 mm x 48 mm on prior  measured at the level of the main pulmonary  artery (image 65). The transverse arch is normal diameter as well  as the descending thoracic aorta. No acute findings of the great  vessels.  There is a coronary artery stent in the left anterior descending  artery as well as the right coronary artery. The pulmonary  arteries are normal caliber. No evidence of pericardial fluid.  No axillary supraclavicular lymphadenopathy. No mediastinal or  hilar lymphadenopathy.  View of the lung windows demonstrate no suspicious pulmonary  nodules. Postsurgical change in the right lower lobe.  Review of the upper abdomen demonstrates an intermediate density  cyst associated with the right kidney measuring 18 mm  not changed  from prior. Adrenal glands are normal. Limited view of the  skeleton demonstrates degenerative spurring  IMPRESSION:  1. Stable aneurysmal dilatation of the ascending aorta to 48 mm.  2. Stable exam of the chest and upper abdomen.   Impression: 78 year old male with a stable ascending aortic aneurysm, 4.8 cm.  There is no indication for surgery at this time. We will continue to follow him with annual CT angiograms.   Plan: Return in one year with CT angiogram of the chest.

## 2012-05-30 ENCOUNTER — Encounter: Payer: Self-pay | Admitting: Cardiology

## 2012-07-06 ENCOUNTER — Other Ambulatory Visit: Payer: Self-pay | Admitting: Cardiology

## 2012-07-06 NOTE — Telephone Encounter (Signed)
..   Requested Prescriptions   Pending Prescriptions Disp Refills  . pravastatin (PRAVACHOL) 40 MG tablet [Pharmacy Med Name: PRAVASTATIN TABS 40MG ] 90 tablet 0    Sig: TAKE 1 TABLET BY MOUTH EVERY EVENING

## 2012-07-21 ENCOUNTER — Ambulatory Visit: Payer: Medicare Other | Admitting: Cardiology

## 2012-07-25 ENCOUNTER — Ambulatory Visit (INDEPENDENT_AMBULATORY_CARE_PROVIDER_SITE_OTHER): Payer: Medicare Other | Admitting: Cardiology

## 2012-07-25 ENCOUNTER — Encounter: Payer: Self-pay | Admitting: Cardiology

## 2012-07-25 VITALS — BP 120/60 | HR 80 | Ht 67.0 in | Wt 171.8 lb

## 2012-07-25 DIAGNOSIS — I259 Chronic ischemic heart disease, unspecified: Secondary | ICD-10-CM

## 2012-07-25 DIAGNOSIS — I2510 Atherosclerotic heart disease of native coronary artery without angina pectoris: Secondary | ICD-10-CM

## 2012-07-25 NOTE — Patient Instructions (Addendum)
The current medical regimen is effective;  continue present plan and medications.  Follow up in 1 year with Dr Hochrein.  You will receive a letter in the mail 2 months before you are due.  Please call us when you receive this letter to schedule your follow up appointment.  

## 2012-07-25 NOTE — Progress Notes (Signed)
HPI The patient presents for followup of his known coronary disease. Since I last saw him he has had no new cardiovascular complaints. He does hard and heavy yard work without bringing on any chest discomfort, jaw discomfort  or excessive shortness of breath. He denies any palpitations, presyncope or syncope. He has no PND or orthopnea. He has had close followup of his labs at the [place]. This included liver enzymes which she was concerned about in the past. I reviewed these and his labs are excellent.  No Known Allergies  Current Outpatient Prescriptions  Medication Sig Dispense Refill  . aspirin EC 81 MG tablet Take 81 mg by mouth daily.      . fish oil-omega-3 fatty acids 1000 MG capsule Take 1-2 g by mouth 2 (two) times daily. Take 1 capsule every morning and 2 capsules every evening      . guaiFENesin (MUCINEX) 600 MG 12 hr tablet Take 1,200 mg by mouth as needed. For congestion      . losartan (COZAAR) 50 MG tablet Take 50 mg by mouth daily.       . pravastatin (PRAVACHOL) 40 MG tablet TAKE 1 TABLET BY MOUTH EVERY EVENING  90 tablet  0  . Tamsulosin HCl (FLOMAX) 0.4 MG CAPS Take 0.4 mg by mouth daily.        No current facility-administered medications for this visit.    Past Medical History  Diagnosis Date  . GERD (gastroesophageal reflux disease)   . Arthritis   . COPD (chronic obstructive pulmonary disease) 05/22/2008    FEV1/FVS 44.9  . Squamous cell carcinoma lung 2000    CT w/ CM; 04/25/2009 COPD, s/p RLL, CAD, 5 cm ascending aortic aneurysm  . Spontaneous pneumothorax   . Diabetes mellitus     Well controlled  . BPH (benign prostatic hyperplasia)   . Hyperlipidemia   . Type II or unspecified type diabetes mellitus without mention of complication, not stated as uncontrolled   . Precordial pain   . Jaw pain   . CAD (coronary artery disease)     s/p Xience stent ot the distal RAC 2010, PCI with Xience stent to distal RCA at the [facility] 2011  . Hypertension   . Thoracic  ascending aortic aneurysm     4.8 cm 2011    Past Surgical History  Procedure Laterality Date  . Lobectomy  2000    Right lower for squamous cell ca  . Needle removed from the right foot    . Lumbar laminectomy  1978  . Lymph node resected  1983    Axilla  . Spontaneous pneumothrax  1979  . Cardiac catheterization  2010    Stent  . Back surgery      ROS:  As stated in the HPI and negative for all other systems.  PHYSICAL EXAM BP 120/60  Pulse 80  Ht 5\' 7"  (1.702 m)  Wt 171 lb 12.8 oz (77.928 kg)  BMI 26.9 kg/m2 GENERAL:  Well appearing HEENT:  Pupils equal round and reactive, fundi not visualized, oral mucosa unremarkable, dentures NECK:  No jugular venous distention, waveform within normal limits, carotid upstroke brisk and symmetric, no bruits, no thyromegaly LYMPHATICS:  No cervical, inguinal adenopathy LUNGS:  Scattered bilateral expiratory wheezes BACK:  No CVA tenderness CHEST:  Unremarkable HEART:  PMI not displaced or sustained,S1 and S2 within normal limits, no S3, no S4, no clicks, no rubs, no murmurs ABD:  Flat, positive bowel sounds normal in frequency in pitch,  no bruits, no rebound, no guarding, no midline pulsatile mass, no hepatomegaly, no splenomegaly EXT:  2 plus pulses throughout, no edema, no cyanosis no clubbing   EKG: Sinus rhythm, rate, axis within normal limits 80, intervals within normal limits, no acute ST-T wave changes.  07/25/2012   ASSESSMENT AND PLAN  CAD:  The patient has no new sypmtoms.  No further cardiovascular testing is indicated.  We will continue with aggressive risk reduction and meds as listed.  HTN:  The blood pressure is at target. No change in medications is indicated. We will continue with therapeutic lifestyle changes (TLC).  HYPERLIPIDEMIA:  Lipid profile was excellent I reviewed this. No change in therapy is indicated.  AORTIC ANEURYSM:  This is stable at 4.8 cm. This is followed by Dr. Dorris Fetch.

## 2012-09-16 ENCOUNTER — Other Ambulatory Visit: Payer: Self-pay | Admitting: Cardiology

## 2012-10-11 ENCOUNTER — Emergency Department (HOSPITAL_COMMUNITY): Payer: Medicare Other

## 2012-10-11 ENCOUNTER — Emergency Department (HOSPITAL_COMMUNITY)
Admission: EM | Admit: 2012-10-11 | Discharge: 2012-10-11 | Disposition: A | Discharge status: Home / Self Care | Payer: Medicare Other | Attending: Emergency Medicine | Admitting: Emergency Medicine

## 2012-10-11 ENCOUNTER — Encounter (HOSPITAL_COMMUNITY): Payer: Self-pay | Admitting: *Deleted

## 2012-10-11 DIAGNOSIS — J4489 Other specified chronic obstructive pulmonary disease: Secondary | ICD-10-CM | POA: Insufficient documentation

## 2012-10-11 DIAGNOSIS — Z8679 Personal history of other diseases of the circulatory system: Secondary | ICD-10-CM | POA: Insufficient documentation

## 2012-10-11 DIAGNOSIS — Z79899 Other long term (current) drug therapy: Secondary | ICD-10-CM | POA: Insufficient documentation

## 2012-10-11 DIAGNOSIS — Z8719 Personal history of other diseases of the digestive system: Secondary | ICD-10-CM | POA: Insufficient documentation

## 2012-10-11 DIAGNOSIS — Z7982 Long term (current) use of aspirin: Secondary | ICD-10-CM | POA: Insufficient documentation

## 2012-10-11 DIAGNOSIS — E119 Type 2 diabetes mellitus without complications: Secondary | ICD-10-CM | POA: Insufficient documentation

## 2012-10-11 DIAGNOSIS — I10 Essential (primary) hypertension: Secondary | ICD-10-CM | POA: Insufficient documentation

## 2012-10-11 DIAGNOSIS — Z85118 Personal history of other malignant neoplasm of bronchus and lung: Secondary | ICD-10-CM | POA: Insufficient documentation

## 2012-10-11 DIAGNOSIS — Z862 Personal history of diseases of the blood and blood-forming organs and certain disorders involving the immune mechanism: Secondary | ICD-10-CM | POA: Insufficient documentation

## 2012-10-11 DIAGNOSIS — X58XXXA Exposure to other specified factors, initial encounter: Secondary | ICD-10-CM | POA: Insufficient documentation

## 2012-10-11 DIAGNOSIS — Z8639 Personal history of other endocrine, nutritional and metabolic disease: Secondary | ICD-10-CM | POA: Insufficient documentation

## 2012-10-11 DIAGNOSIS — N400 Enlarged prostate without lower urinary tract symptoms: Secondary | ICD-10-CM | POA: Insufficient documentation

## 2012-10-11 DIAGNOSIS — Y939 Activity, unspecified: Secondary | ICD-10-CM | POA: Insufficient documentation

## 2012-10-11 DIAGNOSIS — S7010XA Contusion of unspecified thigh, initial encounter: Secondary | ICD-10-CM | POA: Insufficient documentation

## 2012-10-11 DIAGNOSIS — Z9861 Coronary angioplasty status: Secondary | ICD-10-CM | POA: Insufficient documentation

## 2012-10-11 DIAGNOSIS — Y929 Unspecified place or not applicable: Secondary | ICD-10-CM | POA: Insufficient documentation

## 2012-10-11 DIAGNOSIS — Z87891 Personal history of nicotine dependence: Secondary | ICD-10-CM | POA: Insufficient documentation

## 2012-10-11 DIAGNOSIS — I2510 Atherosclerotic heart disease of native coronary artery without angina pectoris: Secondary | ICD-10-CM | POA: Insufficient documentation

## 2012-10-11 DIAGNOSIS — M129 Arthropathy, unspecified: Secondary | ICD-10-CM | POA: Insufficient documentation

## 2012-10-11 DIAGNOSIS — S7012XA Contusion of left thigh, initial encounter: Secondary | ICD-10-CM

## 2012-10-11 DIAGNOSIS — J449 Chronic obstructive pulmonary disease, unspecified: Secondary | ICD-10-CM | POA: Insufficient documentation

## 2012-10-11 DIAGNOSIS — Z8709 Personal history of other diseases of the respiratory system: Secondary | ICD-10-CM | POA: Insufficient documentation

## 2012-10-11 NOTE — ED Notes (Signed)
C/o pain to medial left leg x 3 days with swelling and bruising.  Denies injury.

## 2012-10-11 NOTE — ED Provider Notes (Signed)
CSN: [id number]     Arrival date & time 10/11/12  8295 History  This chart was scribed for Donnetta Hutching, MD by Blanchard Kelch, ED Scribe. The patient was seen in room APA01/APA01. Patient's care was started at 10:57 AM.    Chief Complaint  Patient presents with  . Leg Pain   (Consider location/radiation/quality/duration/timing/severity/associated sxs/prior Treatment)  The history is provided by the patient. No language interpreter was used.    HPI Comments: Brian Austin is a 79 y.o. male who presents to the Emergency Department complaining of constant, mild, gradually worsening left medial thigh pain with associated swelling that began three days ago. Pain started out as a cramp, but redness then bruising was noticed this morning upon awakening. Patient reports he is able to walk. Palpation makes symptoms worse    Past Medical History  Diagnosis Date  . GERD (gastroesophageal reflux disease)   . Arthritis   . COPD (chronic obstructive pulmonary disease) 05/22/2008    FEV1/FVS 44.9  . Squamous cell carcinoma lung 2000    CT w/ CM; 04/25/2009 COPD, s/p RLL, CAD, 5 cm ascending aortic aneurysm  . Spontaneous pneumothorax   . Diabetes mellitus     Well controlled  . BPH (benign prostatic hyperplasia)   . Hyperlipidemia   . Type II or unspecified type diabetes mellitus without mention of complication, not stated as uncontrolled   . Precordial pain   . Jaw pain   . CAD (coronary artery disease)     s/p Xience stent ot the distal RAC 2010, PCI with Xience stent to distal RCA at the [facility] 2011  . Hypertension   . Thoracic ascending aortic aneurysm     4.8 cm    Past Surgical History  Procedure Laterality Date  . Lobectomy  2000    Right lower for squamous cell ca  . Needle removed from the right foot    . Lumbar laminectomy  1978  . Lymph node resected  1983    Axilla  . Spontaneous pneumothrax  1979  . Cardiac catheterization  2010    Stent  . Back surgery     Family  History  Problem Relation Age of Onset  . Heart disease Neg Hx   . Diabetes Father   . Alcohol abuse Father    History  Substance Use Topics  . Smoking status: Former Smoker    Quit date: 02/16/1984  . Smokeless tobacco: Not on file     Comment: Smoked for 30 years  . Alcohol Use: Yes     Comment: wine with supper    Review of Systems: A complete 10 system review of systems was obtained and all systems are negative except as noted in the HPI and PMH.    Allergies  Review of patient's allergies indicates no known allergies.  Home Medications   Current Outpatient Rx  Name  Route  Sig  Dispense  Refill  . aspirin EC 81 MG tablet   Oral   Take 81 mg by mouth daily.         . fish oil-omega-3 fatty acids 1000 MG capsule   Oral   Take 3 g by mouth daily. Take 1 capsule every morning and 2 capsules every evening         . glucosamine-chondroitin 500-400 MG tablet   Oral   Take 1 tablet by mouth daily.         Marland Kitchen losartan (COZAAR) 50 MG tablet   Oral  Take 50 mg by mouth daily.          Marland Kitchen PRESCRIPTION MEDICATION   Oral   Take 1 tablet by mouth daily. Heart medication **patinet unsure of name**         . Tamsulosin HCl (FLOMAX) 0.4 MG CAPS   Oral   Take 0.4 mg by mouth daily.          . vitamin B-12 (CYANOCOBALAMIN) 100 MCG tablet   Oral   Take 50 mcg by mouth daily.          Triage Vitals: BP 134/63  Pulse 84  Temp(Src) 97.7 F (36.5 C) (Oral)  Resp 20  Ht 5\' 7"  (1.702 m)  Wt 165 lb (74.844 kg)  BMI 25.84 kg/m2  SpO2 98%  Physical Exam  Nursing note and vitals reviewed. Constitutional: He is oriented to person, place, and time. He appears well-developed and well-nourished.  HENT:  Head: Normocephalic and atraumatic.  Eyes: Conjunctivae and EOM are normal. Pupils are equal, round, and reactive to light.  Neck: Normal range of motion. Neck supple.  Pulmonary/Chest: Effort normal.  Musculoskeletal: Normal range of motion.  Tenderness and  ecchymosis to distal medial femur area. Ecchymosis is a 3x14 cm area.  Neurological: He is alert and oriented to person, place, and time.  Skin: Skin is warm and dry.  Psychiatric: He has a normal mood and affect.    ED Course  Procedures (including critical care time)  DIAGNOSTIC STUDIES: Oxygen Saturation is 98% on room air, normal by my interpretation.    COORDINATION OF CARE:  10:59 AM -Will order a leg ultrasound to check for blood clot. Patient verbalizes understanding and agrees with treatment plan.    Labs Review Labs Reviewed - No data to display Imaging Review US Venous Img Lower Unilateral Left  10/11/2012   *RADIOLOGY REPORT*  Clinical Data: Left leg pain and bruising.  BILATERAL LOWER EXTREMITY VENOUS DUPLEX ULTRASOUND  Technique:  Gray-scale sonography with graded compression, as well as color Doppler and duplex ultrasound, were performed to evaluate the deep venous system of both lower extremities from the level of the common femoral vein through the popliteal and proximal calf veins.  Spectral Doppler was utilized to evaluate flow at rest and with distal augmentation maneuvers.  Comparison:  None.  Findings:  Normal compressibility of bilateral common femoral, superficial femoral, and popliteal veins is demonstrated, as well as the visualized proximal calf veins.  No filling defects to suggest DVT on grayscale or color Doppler imaging.  Doppler waveforms show normal direction of venous flow, normal respiratory phasicity and response to augmentation.  IMPRESSION: No evidence of deep vein thrombosis in either lower extremity.   Original Report Authenticated By: Holley Dexter, M.D.    MDM  No diagnosis found. Doppler study of left lower extremity shows no DVT.   History and physical consistent medial quadriceps tendon muscle tear  I personally performed the services described in this documentation, which was scribed in my presence. The recorded information has been  reviewed and is accurate.    Donnetta Hutching, MD 10/11/12 [phone]

## 2012-10-14 ENCOUNTER — Emergency Department (HOSPITAL_COMMUNITY)
Admission: EM | Admit: 2012-10-14 | Discharge: 2012-10-14 | Disposition: A | Discharge status: Home / Self Care | Payer: Medicare Other | Attending: Emergency Medicine | Admitting: Emergency Medicine

## 2012-10-14 ENCOUNTER — Emergency Department (HOSPITAL_COMMUNITY): Payer: Medicare Other

## 2012-10-14 ENCOUNTER — Encounter (HOSPITAL_COMMUNITY): Payer: Self-pay | Admitting: *Deleted

## 2012-10-14 DIAGNOSIS — Z79899 Other long term (current) drug therapy: Secondary | ICD-10-CM | POA: Insufficient documentation

## 2012-10-14 DIAGNOSIS — E119 Type 2 diabetes mellitus without complications: Secondary | ICD-10-CM | POA: Insufficient documentation

## 2012-10-14 DIAGNOSIS — Z9861 Coronary angioplasty status: Secondary | ICD-10-CM | POA: Insufficient documentation

## 2012-10-14 DIAGNOSIS — Z85118 Personal history of other malignant neoplasm of bronchus and lung: Secondary | ICD-10-CM | POA: Insufficient documentation

## 2012-10-14 DIAGNOSIS — I10 Essential (primary) hypertension: Secondary | ICD-10-CM | POA: Insufficient documentation

## 2012-10-14 DIAGNOSIS — I719 Aortic aneurysm of unspecified site, without rupture: Secondary | ICD-10-CM | POA: Insufficient documentation

## 2012-10-14 DIAGNOSIS — I2510 Atherosclerotic heart disease of native coronary artery without angina pectoris: Secondary | ICD-10-CM | POA: Insufficient documentation

## 2012-10-14 DIAGNOSIS — J441 Chronic obstructive pulmonary disease with (acute) exacerbation: Secondary | ICD-10-CM | POA: Insufficient documentation

## 2012-10-14 DIAGNOSIS — Z8719 Personal history of other diseases of the digestive system: Secondary | ICD-10-CM | POA: Insufficient documentation

## 2012-10-14 DIAGNOSIS — Z87891 Personal history of nicotine dependence: Secondary | ICD-10-CM | POA: Insufficient documentation

## 2012-10-14 DIAGNOSIS — Z7982 Long term (current) use of aspirin: Secondary | ICD-10-CM | POA: Insufficient documentation

## 2012-10-14 DIAGNOSIS — Z862 Personal history of diseases of the blood and blood-forming organs and certain disorders involving the immune mechanism: Secondary | ICD-10-CM | POA: Insufficient documentation

## 2012-10-14 DIAGNOSIS — R042 Hemoptysis: Secondary | ICD-10-CM

## 2012-10-14 DIAGNOSIS — Z8709 Personal history of other diseases of the respiratory system: Secondary | ICD-10-CM | POA: Insufficient documentation

## 2012-10-14 DIAGNOSIS — R059 Cough, unspecified: Secondary | ICD-10-CM

## 2012-10-14 DIAGNOSIS — Z87448 Personal history of other diseases of urinary system: Secondary | ICD-10-CM | POA: Insufficient documentation

## 2012-10-14 DIAGNOSIS — R05 Cough: Secondary | ICD-10-CM | POA: Insufficient documentation

## 2012-10-14 DIAGNOSIS — M129 Arthropathy, unspecified: Secondary | ICD-10-CM | POA: Insufficient documentation

## 2012-10-14 DIAGNOSIS — Z8639 Personal history of other endocrine, nutritional and metabolic disease: Secondary | ICD-10-CM | POA: Insufficient documentation

## 2012-10-14 LAB — PROTIME-INR
INR: 0.97 (ref 0.00–1.49)
Prothrombin Time: 12.7 seconds (ref 11.6–15.2)

## 2012-10-14 LAB — BASIC METABOLIC PANEL
CO2: 30 mEq/L (ref 19–32)
Chloride: 97 mEq/L (ref 96–112)
GFR calc Af Amer: >90 mL/min (ref >90)
Potassium: 4.2 mEq/L (ref 3.5–5.1)
Sodium: 135 mEq/L (ref 135–145)

## 2012-10-14 LAB — CBC WITH DIFFERENTIAL/PLATELET
Basophils Absolute: 0 10*3/uL (ref 0.0–0.1)
HCT: 45 % (ref 39.0–52.0)
Lymphocytes Relative: 24 % (ref 12–46)
Monocytes Absolute: 0.6 10*3/uL (ref 0.1–1.0)
Neutro Abs: 4.8 10*3/uL (ref 1.7–7.7)
Neutrophils Relative %: 66 % (ref 43–77)
Platelets: 177 10*3/uL (ref 150–400)
RDW: 13.1 % (ref 11.5–15.5)
WBC: 7.2 10*3/uL (ref 4.0–10.5)

## 2012-10-14 LAB — TROPONIN I: Troponin I: <0.3 ng/mL (ref <0.30)

## 2012-10-14 MED ORDER — IOHEXOL 350 MG/ML SOLN
100.0000 mL | Freq: Once | INTRAVENOUS | Status: AC | PRN
  Administered 2012-10-14: 100 mL via INTRAVENOUS

## 2012-10-14 MED ORDER — ALBUTEROL SULFATE (5 MG/ML) 0.5% IN NEBU
5.0000 mg | INHALATION_SOLUTION | Freq: Once | RESPIRATORY_TRACT | Status: AC
  Administered 2012-10-14: 5 mg via RESPIRATORY_TRACT
  Filled 2012-10-14: qty 1

## 2012-10-14 NOTE — ED Provider Notes (Signed)
CSN: [id number]     Arrival date & time 10/14/12  1841 History  This chart was scribed for Vida Roller, MD, by Yevette Edwards, ED Scribe. This patient was seen in room APA17/APA17 and the patient's care was started at 7:14 PM.   First MD Initiated Contact with Patient 10/14/12 1902     Chief Complaint  Patient presents with  . Hemoptysis    The history is provided by the patient. No language interpreter was used.   HPI Comments: Brian Austin is a 79 y.o. male, with a h/o lung cancer and a spontaneous pneumothorax, who presents to the Emergency Department complaining of recurrent, intermittent hemoptysis which occurred once last week and once two hours ago today. He reports the phlegm he expectorated had "chunks" of blood in it. He also experienced brief, left back pain followed by mild, brief left anterior chest pain an hour ago; both of which have spontaneously resolved.  He denies any increased coughing, fever, chills, recent epistaxis, abdominal pain, weakness or SOB. The pt reports that he experienced hemoptysis in 2000, prior to being diagnosed with lung cancer. His lung cancer was not treated with chemo or radiation. The pt also reports that he was treated in the ED for pain, swelling, and ecchymosis to his left lower extremity. The pt denies any h/o blood clots and he denies any recent travels. The pt takes 80 mg of aspirin; he has an aortic aneurysm, and his most recent CT scan was 10 months ago.  He denies knowledge of COPD. The pt is a former smoker.   Past Medical History  Diagnosis Date  . GERD (gastroesophageal reflux disease)   . Arthritis   . COPD (chronic obstructive pulmonary disease) 05/22/2008    FEV1/FVS 44.9  . Squamous cell carcinoma lung 2000    CT w/ CM; 04/25/2009 COPD, s/p RLL, CAD, 5 cm ascending aortic aneurysm  . Spontaneous pneumothorax   . BPH (benign prostatic hyperplasia)   . Hyperlipidemia   . Precordial pain   . Jaw pain   . CAD (coronary artery  disease)     s/p Xience stent ot the distal RAC 2010, PCI with Xience stent to distal RCA at the [facility] 2011  . Hypertension   . Thoracic ascending aortic aneurysm     4.8 cm   . Diabetes mellitus     Well controlled  . Type II or unspecified type diabetes mellitus without mention of complication, not stated as uncontrolled    Past Surgical History  Procedure Laterality Date  . Lobectomy  2000    Right lower for squamous cell ca  . Needle removed from the right foot    . Lumbar laminectomy  1978  . Lymph node resected  1983    Axilla  . Spontaneous pneumothrax  1979  . Cardiac catheterization  2010    Stent  . Back surgery     Family History  Problem Relation Age of Onset  . Heart disease Neg Hx   . Diabetes Father   . Alcohol abuse Father    History  Substance Use Topics  . Smoking status: Former Smoker    Quit date: 02/16/1984  . Smokeless tobacco: Not on file     Comment: Smoked for 30 years  . Alcohol Use: Yes     Comment: wine with supper    Review of Systems  A complete 10 system review of systems was obtained, and all systems were negative except where indicated in  the HPI and PE.   Allergies  Review of patient's allergies indicates no known allergies.  Home Medications   Current Outpatient Rx  Name  Route  Sig  Dispense  Refill  . aspirin EC 81 MG tablet   Oral   Take 81 mg by mouth daily.         . fish oil-omega-3 fatty acids 1000 MG capsule   Oral   Take 3 g by mouth daily.          Marland Kitchen glucosamine-chondroitin 500-400 MG tablet   Oral   Take 1 tablet by mouth 2 (two) times daily.          Marland Kitchen losartan (COZAAR) 50 MG tablet   Oral   Take 50 mg by mouth daily.          . magnesium oxide (MAG-OX) 400 MG tablet   Oral   Take 400 mg by mouth daily.         . Tamsulosin HCl (FLOMAX) 0.4 MG CAPS   Oral   Take 0.4 mg by mouth daily.          . vitamin B-12 (CYANOCOBALAMIN) 100 MCG tablet   Oral   Take 50 mcg by mouth daily.           Triage Vitals: BP 158/69  Pulse 73  Temp(Src) 98.2 F (36.8 C) (Oral)  Resp 17  Ht 5\' 7"  (1.702 m)  Wt 165 lb (74.844 kg)  BMI 25.84 kg/m2  SpO2 96%  Physical Exam  Nursing note and vitals reviewed. Constitutional: He is oriented to person, place, and time. He appears well-developed and well-nourished. No distress.  HENT:  Head: Normocephalic and atraumatic.  Eyes: EOM are normal.  Neck: Normal range of motion. Neck supple. No tracheal deviation present.  Cardiovascular: Normal rate.   Pulmonary/Chest: Effort normal. No respiratory distress. He has wheezes. He has no rales.  Mild bilateral wheezing upon expiration. No rales.   Musculoskeletal: Normal range of motion. He exhibits edema.  +1 pitting edema to the left lower extremity. Bruising to mid-lateral left extremity from the calf to the thigh.  No edema to right lower extremity.   Neurological: He is alert and oriented to person, place, and time.  Skin: Skin is warm and dry.  Psychiatric: He has a normal mood and affect. His behavior is normal.    ED Course  Procedures (including critical care time)  DIAGNOSTIC STUDIES:  Oxygen Saturation is 96% on room air, normal by my interpretation.    COORDINATION OF CARE:  7:20 PM- Discussed treatment plan with patient, and the patient agreed to the plan.   Labs Review Labs Reviewed  BASIC METABOLIC PANEL - Abnormal; Notable for the following:    Glucose, Bld 100 (*)    GFR calc non Af Amer 83 (*)    All other components within normal limits  CBC WITH DIFFERENTIAL  PROTIME-INR  TROPONIN I   Imaging Review Ct Angio Chest Pe W/cm &/or Wo Cm  10/14/2012   CLINICAL DATA:  COPD. Prior lung cancer. Are mild to cyst.  EXAM: CT ANGIOGRAPHY CHEST WITH CONTRAST  TECHNIQUE: Multidetector CT imaging of the chest was performed using the standard protocol during bolus administration of intravenous contrast. Multiplanar CT image reconstructions including MIPs were obtained to  evaluate the vascular anatomy.  CONTRAST:  OMNIPAQUE IOHEXOL 350 MG/ML SOLN  COMPARISON:  12/28/2011  FINDINGS: Aneurysmal dilatation of the ascending thoracic aorta again noted measuring up to 4.8 cm,  stable since prior study. Aortic arch and descending thoracic calcifications without aneurysm. Heart is borderline in size. Coronary artery calcifications. No filling defects in the pulmonary arteries to suggest pulmonary emboli.  No mediastinal, hilar, or axillary adenopathy. Visualized thyroid and chest wall soft tissues unremarkable.  Postsurgical changes in the right lower lobe. No suspicious pulmonary nodules. No pleural effusions.  Imaging into the upper abdomen shows no acute findings.  No acute bony abnormality. Diffuse degenerative changes in the thoracic spine.  Review of the MIP images confirms the above findings.  IMPRESSION: No suspicious pulmonary nodule. No recurrent mass. No acute findings.  No evidence of pulmonary embolus.   Electronically Signed   By: Charlett Nose   On: 10/14/2012 21:10    MDM   1. Hemoptysis   2. Cough   3.  Right Bundle Branch Block  Pt has brief fleeting chest pain and back pai on the L and hemoptysis - despite recent w/u with dopplers of bil LE's, his hx of Lung CA and swelling in the leg is enough for me to look for PE - this will also evaluate for recurrent CA, PNA, PTX and his aneurysm.  His VS show mild hypertension but no sig hypoxia and no distress.  He does have mild wheezing on exam - neb ordered.  CT shows no acute findings - no PE, no increase in size of aneurysm, VS remain unremarkable - RBBB discussed with pt - stable for d/c - rec's of cards adn PMD f/u give, understanding expressed.  ED ECG REPORT  I personally interpreted this EKG   Date: 10/14/2012   Rate: 66  Rhythm: normal sinus rhythm  QRS Axis: normal  Intervals: QRS prolonged  ST/T Wave abnormalities: normal  Conduction Disutrbances:right bundle branch block  Narrative  Interpretation:   Old EKG Reviewed: c/w prior from 10/08/11, now has RBBB, no other changes.    I personally performed the services described in this documentation, which was scribed in my presence. The recorded information has been reviewed and is accurate.       Vida Roller, MD 10/14/12 2227

## 2012-10-14 NOTE — ED Notes (Addendum)
Pt reports coughing up blood x1 day. Pt reports previous episodes in 1979 having a spontaneous pneumothorax to left lung and when diagnosed with lung cancer in 2000. Pt states having "lobe removed to rid cancer." Pt in NAD at present time. Pt a/o x4.
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
## 2012-10-31 ENCOUNTER — Telehealth: Payer: Self-pay | Admitting: Cardiology
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
## 2012-10-31 NOTE — Telephone Encounter (Signed)
New Problem  Pt states that his most recent EKG's indicate that he has a Right Bundle Branch Block... Request a call back to discuss.

## 2012-11-03 NOTE — Telephone Encounter (Signed)
Left message for pt to call back  °
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
## 2012-11-04 ENCOUNTER — Emergency Department (HOSPITAL_COMMUNITY)
Admission: EM | Admit: 2012-11-04 | Discharge: 2012-11-04 | Disposition: A | Discharge status: Home / Self Care | Payer: Medicare Other | Attending: Emergency Medicine | Admitting: Emergency Medicine

## 2012-11-04 ENCOUNTER — Emergency Department (HOSPITAL_COMMUNITY): Payer: Medicare Other

## 2012-11-04 ENCOUNTER — Encounter (HOSPITAL_COMMUNITY): Payer: Self-pay

## 2012-11-04 DIAGNOSIS — Z79899 Other long term (current) drug therapy: Secondary | ICD-10-CM | POA: Insufficient documentation

## 2012-11-04 DIAGNOSIS — Z7982 Long term (current) use of aspirin: Secondary | ICD-10-CM | POA: Insufficient documentation

## 2012-11-04 DIAGNOSIS — S46909A Unspecified injury of unspecified muscle, fascia and tendon at shoulder and upper arm level, unspecified arm, initial encounter: Secondary | ICD-10-CM | POA: Insufficient documentation

## 2012-11-04 DIAGNOSIS — E119 Type 2 diabetes mellitus without complications: Secondary | ICD-10-CM | POA: Insufficient documentation

## 2012-11-04 DIAGNOSIS — Z85118 Personal history of other malignant neoplasm of bronchus and lung: Secondary | ICD-10-CM | POA: Insufficient documentation

## 2012-11-04 DIAGNOSIS — I2510 Atherosclerotic heart disease of native coronary artery without angina pectoris: Secondary | ICD-10-CM | POA: Insufficient documentation

## 2012-11-04 DIAGNOSIS — Z87891 Personal history of nicotine dependence: Secondary | ICD-10-CM | POA: Insufficient documentation

## 2012-11-04 DIAGNOSIS — J449 Chronic obstructive pulmonary disease, unspecified: Secondary | ICD-10-CM | POA: Insufficient documentation

## 2012-11-04 DIAGNOSIS — Z9861 Coronary angioplasty status: Secondary | ICD-10-CM | POA: Insufficient documentation

## 2012-11-04 DIAGNOSIS — Y9389 Activity, other specified: Secondary | ICD-10-CM | POA: Insufficient documentation

## 2012-11-04 DIAGNOSIS — Y929 Unspecified place or not applicable: Secondary | ICD-10-CM | POA: Insufficient documentation

## 2012-11-04 DIAGNOSIS — J4489 Other specified chronic obstructive pulmonary disease: Secondary | ICD-10-CM | POA: Insufficient documentation

## 2012-11-04 DIAGNOSIS — K219 Gastro-esophageal reflux disease without esophagitis: Secondary | ICD-10-CM | POA: Insufficient documentation

## 2012-11-04 DIAGNOSIS — X500XXA Overexertion from strenuous movement or load, initial encounter: Secondary | ICD-10-CM | POA: Insufficient documentation

## 2012-11-04 DIAGNOSIS — M25511 Pain in right shoulder: Secondary | ICD-10-CM

## 2012-11-04 DIAGNOSIS — I10 Essential (primary) hypertension: Secondary | ICD-10-CM | POA: Insufficient documentation

## 2012-11-04 DIAGNOSIS — N400 Enlarged prostate without lower urinary tract symptoms: Secondary | ICD-10-CM | POA: Insufficient documentation

## 2012-11-04 DIAGNOSIS — S4980XA Other specified injuries of shoulder and upper arm, unspecified arm, initial encounter: Secondary | ICD-10-CM | POA: Insufficient documentation

## 2012-11-04 DIAGNOSIS — M129 Arthropathy, unspecified: Secondary | ICD-10-CM | POA: Insufficient documentation

## 2012-11-04 MED ORDER — MORPHINE SULFATE 4 MG/ML IJ SOLN
6.0000 mg | Freq: Once | INTRAMUSCULAR | Status: AC
  Administered 2012-11-04: 6 mg via INTRAVENOUS
  Filled 2012-11-04: qty 2

## 2012-11-04 MED ORDER — MORPHINE SULFATE 4 MG/ML IJ SOLN: 4.0000 mg | Freq: Once | INTRAMUSCULAR | Status: DC

## 2012-11-04 MED ORDER — KETOROLAC TROMETHAMINE 30 MG/ML IJ SOLN
30.0000 mg | Freq: Once | INTRAMUSCULAR | Status: AC
  Administered 2012-11-04: 30 mg via INTRAVENOUS
  Filled 2012-11-04: qty 1

## 2012-11-04 NOTE — ED Notes (Signed)
Patient waiting in room for med time/ride.

## 2012-11-04 NOTE — ED Provider Notes (Signed)
CSN: [id number]     Arrival date & time 11/04/12  0945 History   First MD Initiated Contact with Patient 11/04/12 [phone]     Chief Complaint  Patient presents with  . Shoulder Pain    HPI Patient reports worsening right shoulder pain over the past 4 days.  He does state that his been doing some heavy lifting while riding his lawnmower and doing an action with his right hand in a pulling motion.  Patient denies fevers and chills.  He did have trauma to his right shoulder approximately 6 weeks ago but states he's had no difficulty after that fall.  No difficulty breathing or recent cough.  No recent rash.  Symptoms are mild to moderate in severity.  The pain is worse when he attempts to AB duct his right arm on his own.  His pain is significantly reduced with passive range of motion of his right shoulder.   Past Medical History  Diagnosis Date  . GERD (gastroesophageal reflux disease)   . Arthritis   . COPD (chronic obstructive pulmonary disease) 05/22/2008    FEV1/FVS 44.9  . Squamous cell carcinoma lung 2000    CT w/ CM; 04/25/2009 COPD, s/p RLL, CAD, 5 cm ascending aortic aneurysm  . Spontaneous pneumothorax   . BPH (benign prostatic hyperplasia)   . Hyperlipidemia   . Precordial pain   . Jaw pain   . CAD (coronary artery disease)     s/p Xience stent ot the distal RAC 2010, PCI with Xience stent to distal RCA at the [facility] 2011  . Hypertension   . Thoracic ascending aortic aneurysm     4.8 cm   . Diabetes mellitus     Well controlled  . Type II or unspecified type diabetes mellitus without mention of complication, not stated as uncontrolled    Past Surgical History  Procedure Laterality Date  . Lobectomy  2000    Right lower for squamous cell ca  . Needle removed from the right foot    . Lumbar laminectomy  1978  . Lymph node resected  1983    Axilla  . Spontaneous pneumothrax  1979  . Cardiac catheterization  2010    Stent  . Back surgery     Family History  Problem  Relation Age of Onset  . Heart disease Neg Hx   . Diabetes Father   . Alcohol abuse Father    History  Substance Use Topics  . Smoking status: Former Smoker    Quit date: 02/16/1984  . Smokeless tobacco: Not on file     Comment: Smoked for 30 years  . Alcohol Use: Yes     Comment: wine with supper    Review of Systems  All other systems reviewed and are negative.    Allergies  Review of patient's allergies indicates no known allergies.  Home Medications   Current Outpatient Rx  Name  Route  Sig  Dispense  Refill  . aspirin EC 81 MG tablet   Oral   Take 81 mg by mouth daily.         . fish oil-omega-3 fatty acids 1000 MG capsule   Oral   Take 3 g by mouth daily.          Marland Kitchen glucosamine-chondroitin 500-400 MG tablet   Oral   Take 1 tablet by mouth 2 (two) times daily.          Marland Kitchen HYDROcodone-acetaminophen (NORCO/VICODIN) 5-325 MG per tablet   Oral  Take 1 tablet by mouth every 6 (six) hours as needed for pain.         Marland Kitchen losartan (COZAAR) 50 MG tablet   Oral   Take 50 mg by mouth daily.          . magnesium oxide (MAG-OX) 400 MG tablet   Oral   Take 400 mg by mouth daily.         . Tamsulosin HCl (FLOMAX) 0.4 MG CAPS   Oral   Take 0.4 mg by mouth daily.          . vitamin B-12 (CYANOCOBALAMIN) 100 MCG tablet   Oral   Take 50 mcg by mouth daily.          BP 155/79  Pulse 80  Temp(Src) 97.7 F (36.5 C) (Oral)  Resp 20  Ht 5\' 8"  (1.727 m)  Wt 165 lb (74.844 kg)  BMI 25.09 kg/m2  SpO2 96% Physical Exam  Nursing note and vitals reviewed. Constitutional: He is oriented to person, place, and time. He appears well-developed and well-nourished.  HENT:  Head: Normocephalic and atraumatic.  Eyes: EOM are normal.  Neck: Normal range of motion.  Cardiovascular: Normal rate, regular rhythm, normal heart sounds and intact distal pulses.   Pulmonary/Chest: Effort normal and breath sounds normal. No respiratory distress.  Abdominal: Soft. He  exhibits no distension. There is no tenderness.  Musculoskeletal: Normal range of motion.  Ala passively range the patient's right shoulder without any significant difficulty.  No true impingement noted until reached approximately 85 of abduction.  Normal right radial pulse.  Normal grip strength in right hand.  Right hand and arm is well-perfused.  No tenderness of the right a.c. joint.  No obvious deformity of his right shoulder.  No erythema warmth of his right shoulder.  Neurological: He is alert and oriented to person, place, and time.  Skin: Skin is warm and dry.  Psychiatric: He has a normal mood and affect. Judgment normal.    ED Course  Procedures (including critical care time) Labs Review Labs Reviewed - No data to display Imaging Review Dg Shoulder Right  11/04/2012   CLINICAL DATA:  Right shoulder pain. Fall 8 weeks ago.  EXAM: RIGHT SHOULDER - 2+ VIEW  COMPARISON:  10/31/2012  FINDINGS: Calcification tracking along the distal rotator cuff is suspicious for calcific tendinopathy. Mild degenerative AC joint arthropathy. No acute fracture or dislocation.  IMPRESSION: 1. Suspected calcific rotator cuff tendinopathy (chronic). 2. Mild degenerative AC joint arthropathy. 3. If pain persists despite conservative therapy, MRI may be warranted for further characterization.   Electronically Signed   By: Herbie Baltimore   On: 11/04/2012 10:19   I personally reviewed the imaging tests through PACS system I reviewed available ER/hospitalization records through the EMR  MDM   1. Right shoulder pain    This is likely exacerbation of a prior right shoulder/rotator cuff injury.  This may represent rotator cuff tendinitis.  Patient's pain will be treated he be placed in a sling for comfort.  Range of motion exercises were recommended so that he does not get a frozen shoulder.  Orthopedic followup recommended.  The patient will likely need MRI imaging as an outpatient.    Lyanne Co,  MD 11/04/12 1047

## 2012-11-04 NOTE — ED Notes (Signed)
Pt fell 1 week ago, landed on right shoulder, cont to have pain to the shoulder, has been to his doctor and had a xray, and was told that he may have an infection in his shoulder.  Pt does not agree. And thinks his collarbone may be broken.

## 2012-11-05 NOTE — Telephone Encounter (Signed)
If he had follow up with his PCP and there was no suggestion of acute cardiac complaints or suggestion of DVT/PE then this is probably true, true and unrelated.

## 2012-11-07 NOTE — Telephone Encounter (Signed)
Pt aware.

## 2012-11-08 ENCOUNTER — Emergency Department (HOSPITAL_COMMUNITY)
Admission: EM | Admit: 2012-11-08 | Discharge: 2012-11-08 | Disposition: A | Discharge status: Home / Self Care | Payer: Medicare Other | Source: Home / Self Care | Attending: Emergency Medicine | Admitting: Emergency Medicine

## 2012-11-08 ENCOUNTER — Encounter (HOSPITAL_COMMUNITY): Payer: Self-pay | Admitting: Emergency Medicine

## 2012-11-08 ENCOUNTER — Emergency Department (HOSPITAL_COMMUNITY): Payer: Medicare Other

## 2012-11-08 DIAGNOSIS — Z7982 Long term (current) use of aspirin: Secondary | ICD-10-CM | POA: Insufficient documentation

## 2012-11-08 DIAGNOSIS — Z79899 Other long term (current) drug therapy: Secondary | ICD-10-CM | POA: Insufficient documentation

## 2012-11-08 DIAGNOSIS — Z8639 Personal history of other endocrine, nutritional and metabolic disease: Secondary | ICD-10-CM | POA: Insufficient documentation

## 2012-11-08 DIAGNOSIS — Y929 Unspecified place or not applicable: Secondary | ICD-10-CM | POA: Insufficient documentation

## 2012-11-08 DIAGNOSIS — J4489 Other specified chronic obstructive pulmonary disease: Secondary | ICD-10-CM | POA: Insufficient documentation

## 2012-11-08 DIAGNOSIS — W540XXA Bitten by dog, initial encounter: Secondary | ICD-10-CM | POA: Insufficient documentation

## 2012-11-08 DIAGNOSIS — E119 Type 2 diabetes mellitus without complications: Secondary | ICD-10-CM | POA: Insufficient documentation

## 2012-11-08 DIAGNOSIS — Z87448 Personal history of other diseases of urinary system: Secondary | ICD-10-CM | POA: Insufficient documentation

## 2012-11-08 DIAGNOSIS — S61209A Unspecified open wound of unspecified finger without damage to nail, initial encounter: Secondary | ICD-10-CM | POA: Insufficient documentation

## 2012-11-08 DIAGNOSIS — I10 Essential (primary) hypertension: Secondary | ICD-10-CM | POA: Insufficient documentation

## 2012-11-08 DIAGNOSIS — Y9389 Activity, other specified: Secondary | ICD-10-CM | POA: Insufficient documentation

## 2012-11-08 DIAGNOSIS — I2510 Atherosclerotic heart disease of native coronary artery without angina pectoris: Secondary | ICD-10-CM | POA: Insufficient documentation

## 2012-11-08 DIAGNOSIS — Z862 Personal history of diseases of the blood and blood-forming organs and certain disorders involving the immune mechanism: Secondary | ICD-10-CM | POA: Insufficient documentation

## 2012-11-08 DIAGNOSIS — J449 Chronic obstructive pulmonary disease, unspecified: Secondary | ICD-10-CM | POA: Insufficient documentation

## 2012-11-08 DIAGNOSIS — M129 Arthropathy, unspecified: Secondary | ICD-10-CM | POA: Insufficient documentation

## 2012-11-08 DIAGNOSIS — Z8719 Personal history of other diseases of the digestive system: Secondary | ICD-10-CM | POA: Insufficient documentation

## 2012-11-08 DIAGNOSIS — Z87891 Personal history of nicotine dependence: Secondary | ICD-10-CM | POA: Insufficient documentation

## 2012-11-08 DIAGNOSIS — Z23 Encounter for immunization: Secondary | ICD-10-CM | POA: Insufficient documentation

## 2012-11-08 MED ORDER — TETANUS-DIPHTH-ACELL PERTUSSIS 5-2.5-18.5 LF-MCG/0.5 IM SUSP
0.5000 mL | Freq: Once | INTRAMUSCULAR | Status: AC
  Administered 2012-11-08: 0.5 mL via INTRAMUSCULAR
  Filled 2012-11-08: qty 0.5

## 2012-11-08 MED ORDER — AMOXICILLIN-POT CLAVULANATE 875-125 MG PO TABS: 1.0000 | ORAL_TABLET | Freq: Two times a day (BID) | ORAL | Status: DC

## 2012-11-08 MED ORDER — AMOXICILLIN-POT CLAVULANATE 875-125 MG PO TABS
1.0000 | ORAL_TABLET | Freq: Once | ORAL | Status: AC
  Administered 2012-11-08: 1 via ORAL
  Filled 2012-11-08: qty 1

## 2012-11-08 NOTE — ED Notes (Signed)
MD at bedside. 

## 2012-11-08 NOTE — ED Notes (Signed)
Pt c/o dog bite to the left middle finger. The dog was is great nephews. He was trying to take something away from the dog.

## 2012-11-08 NOTE — ED Provider Notes (Signed)
CSN: [id number]     Arrival date & time 11/08/12  2043 History  This chart was scribed for Benny Lennert, MD by Allene Dillon, ED Scribe. This patient was seen in room APA03/APA03 and the patient's care was started at 9:00 PM.    Chief Complaint  Patient presents with  . Animal Bite    Patient is a 79 y.o. male presenting with animal bite. The history is provided by the patient. No language interpreter was used.  Animal Bite Contact animal:  Dog Location:  Finger Finger injury location:  L long finger Time since incident:  8 hours Pain details:    Quality:  Unable to specify   Severity:  Moderate   Timing:  Constant   Progression:  Worsening Incident location:  Home Provoked: provoked   Notifications:  None Animal's rabies vaccination status:  Unknown Animal in possession: no   Tetanus status:  Unknown Relieved by:  Nothing Worsened by:  Nothing tried Ineffective treatments:  None tried Associated symptoms: swelling   Associated symptoms: no fever, no numbness and no rash    HPI Comments: Brian Austin is a 79 y.o. male who presents to the Emergency Department complaining of dog bite on left middle finger which occurred 8 hours ago. Pt reports dog belongs to grand nephew's and was attempting to retreive an item from dog when attacked occurred. Pt has associated swelling, bruising, and pain. Pt's last tetanus shot is unknown. Pt denies fever, chills, numbness, nausea, or any other symptoms.     Past Medical History  Diagnosis Date  . GERD (gastroesophageal reflux disease)   . Arthritis   . COPD (chronic obstructive pulmonary disease) 05/22/2008    FEV1/FVS 44.9  . Squamous cell carcinoma lung 2000    CT w/ CM; 04/25/2009 COPD, s/p RLL, CAD, 5 cm ascending aortic aneurysm  . Spontaneous pneumothorax   . BPH (benign prostatic hyperplasia)   . Hyperlipidemia   . Precordial pain   . Jaw pain   . CAD (coronary artery disease)     s/p Xience stent ot the distal RAC  2010, PCI with Xience stent to distal RCA at the [facility] 2011  . Hypertension   . Thoracic ascending aortic aneurysm     4.8 cm   . Diabetes mellitus     Well controlled  . Type II or unspecified type diabetes mellitus without mention of complication, not stated as uncontrolled    Past Surgical History  Procedure Laterality Date  . Lobectomy  2000    Right lower for squamous cell ca  . Needle removed from the right foot    . Lumbar laminectomy  1978  . Lymph node resected  1983    Axilla  . Spontaneous pneumothrax  1979  . Cardiac catheterization  2010    Stent  . Back surgery     Family History  Problem Relation Age of Onset  . Heart disease Neg Hx   . Diabetes Father   . Alcohol abuse Father    History  Substance Use Topics  . Smoking status: Former Smoker    Quit date: 02/16/1984  . Smokeless tobacco: Not on file     Comment: Smoked for 30 years  . Alcohol Use: Yes     Comment: wine with supper    Review of Systems  Constitutional: Negative for fever, chills, appetite change and fatigue.  HENT: Negative for congestion, sinus pressure and ear discharge.   Eyes: Negative for discharge.  Respiratory:  Negative for cough.   Cardiovascular: Negative for chest pain.  Gastrointestinal: Negative for nausea, abdominal pain and diarrhea.  Genitourinary: Negative for frequency and hematuria.  Musculoskeletal: Negative for back pain.  Skin: Positive for wound. Negative for rash.       Dog bit on left long finger   Neurological: Negative for seizures, numbness and headaches.  Psychiatric/Behavioral: Negative for hallucinations.  All other systems reviewed and are negative.    Allergies  Review of patient's allergies indicates no known allergies.  Home Medications   Current Outpatient Rx  Name  Route  Sig  Dispense  Refill  . aspirin EC 81 MG tablet   Oral   Take 81 mg by mouth daily.         . fish oil-omega-3 fatty acids 1000 MG capsule   Oral   Take 3 g by  mouth daily.          Marland Kitchen glucosamine-chondroitin 500-400 MG tablet   Oral   Take 1 tablet by mouth 2 (two) times daily.          Marland Kitchen HYDROcodone-acetaminophen (NORCO/VICODIN) 5-325 MG per tablet   Oral   Take 1 tablet by mouth every 6 (six) hours as needed for pain.         Marland Kitchen losartan (COZAAR) 50 MG tablet   Oral   Take 50 mg by mouth daily.          . magnesium oxide (MAG-OX) 400 MG tablet   Oral   Take 400 mg by mouth daily.         . Tamsulosin HCl (FLOMAX) 0.4 MG CAPS   Oral   Take 0.4 mg by mouth daily.          . vitamin B-12 (CYANOCOBALAMIN) 100 MCG tablet   Oral   Take 50 mcg by mouth daily.          Triage Vitals: BP 127/65  Pulse 94  Temp(Src) 98.1 F (36.7 C) (Oral)  Resp 20  Ht 5\' 8"  (1.727 m)  Wt 165 lb (74.844 kg)  BMI 25.09 kg/m2  SpO2 95% Physical Exam  Nursing note and vitals reviewed. Constitutional: He is oriented to person, place, and time. He appears well-developed. No distress.  HENT:  Head: Normocephalic.  Eyes: Conjunctivae are normal.  Neck: No tracheal deviation present.  Cardiovascular:  No murmur heard. Pulmonary/Chest: Breath sounds normal.  Abdominal: Soft.  Musculoskeletal: Normal range of motion.  Proximal phalanx of left mddle finger is swollen and tender   Neurological: He is oriented to person, place, and time.  Skin: Skin is warm.  Psychiatric: He has a normal mood and affect.    ED Course  Procedures (including critical care time)  DIAGNOSTIC STUDIES: Oxygen Saturation is 95% on RA, adequate by my interpretation.    COORDINATION OF CARE: 9:05 PM- Pt advised of plan for treatment including x-ray and pt agrees.   Labs Review Labs Reviewed - No data to display Imaging Review No results found.  MDM  No diagnosis found.   The chart was scribed for me under my direct supervision.  I personally performed the history, physical, and medical decision making and all procedures in the evaluation of this  patient.Benny Lennert, MD 11/08/12 2206

## 2012-11-08 NOTE — ED Notes (Signed)
Pt reports being bit by a family member's dog at roughly 1200 this afternoon.  Reports that the dog is not up to date on his rabies shot. Bite is to the third finger of the left hand with redness, edema.

## 2012-11-09 ENCOUNTER — Inpatient Hospital Stay (HOSPITAL_COMMUNITY)
Admission: EM | Admit: 2012-11-09 | Discharge: 2012-11-11 | DRG: 603 | Disposition: A | Discharge status: Home / Self Care | Payer: Medicare Other | Attending: Internal Medicine | Admitting: Internal Medicine

## 2012-11-09 ENCOUNTER — Other Ambulatory Visit (HOSPITAL_COMMUNITY): Payer: Self-pay | Admitting: Orthopedic Surgery

## 2012-11-09 ENCOUNTER — Encounter (HOSPITAL_COMMUNITY): Payer: Self-pay | Admitting: Emergency Medicine

## 2012-11-09 DIAGNOSIS — I080 Rheumatic disorders of both mitral and aortic valves: Secondary | ICD-10-CM

## 2012-11-09 DIAGNOSIS — E119 Type 2 diabetes mellitus without complications: Secondary | ICD-10-CM

## 2012-11-09 DIAGNOSIS — M25511 Pain in right shoulder: Secondary | ICD-10-CM

## 2012-11-09 DIAGNOSIS — M279 Disease of jaws, unspecified: Secondary | ICD-10-CM

## 2012-11-09 DIAGNOSIS — I259 Chronic ischemic heart disease, unspecified: Secondary | ICD-10-CM

## 2012-11-09 DIAGNOSIS — I712 Thoracic aortic aneurysm, without rupture, unspecified: Secondary | ICD-10-CM

## 2012-11-09 DIAGNOSIS — Z9861 Coronary angioplasty status: Secondary | ICD-10-CM

## 2012-11-09 DIAGNOSIS — S61409A Unspecified open wound of unspecified hand, initial encounter: Secondary | ICD-10-CM | POA: Diagnosis present

## 2012-11-09 DIAGNOSIS — R9430 Abnormal result of cardiovascular function study, unspecified: Secondary | ICD-10-CM

## 2012-11-09 DIAGNOSIS — N400 Enlarged prostate without lower urinary tract symptoms: Secondary | ICD-10-CM | POA: Diagnosis present

## 2012-11-09 DIAGNOSIS — L03114 Cellulitis of left upper limb: Secondary | ICD-10-CM

## 2012-11-09 DIAGNOSIS — R6884 Jaw pain: Secondary | ICD-10-CM

## 2012-11-09 DIAGNOSIS — I2510 Atherosclerotic heart disease of native coronary artery without angina pectoris: Secondary | ICD-10-CM

## 2012-11-09 DIAGNOSIS — W540XXA Bitten by dog, initial encounter: Secondary | ICD-10-CM | POA: Diagnosis present

## 2012-11-09 DIAGNOSIS — Z7982 Long term (current) use of aspirin: Secondary | ICD-10-CM

## 2012-11-09 DIAGNOSIS — L02519 Cutaneous abscess of unspecified hand: Principal | ICD-10-CM

## 2012-11-09 DIAGNOSIS — Z87891 Personal history of nicotine dependence: Secondary | ICD-10-CM

## 2012-11-09 DIAGNOSIS — Z833 Family history of diabetes mellitus: Secondary | ICD-10-CM

## 2012-11-09 DIAGNOSIS — K219 Gastro-esophageal reflux disease without esophagitis: Secondary | ICD-10-CM | POA: Diagnosis present

## 2012-11-09 DIAGNOSIS — S61452S Open bite of left hand, sequela: Secondary | ICD-10-CM

## 2012-11-09 DIAGNOSIS — R042 Hemoptysis: Secondary | ICD-10-CM

## 2012-11-09 DIAGNOSIS — Z85118 Personal history of other malignant neoplasm of bronchus and lung: Secondary | ICD-10-CM

## 2012-11-09 DIAGNOSIS — E785 Hyperlipidemia, unspecified: Secondary | ICD-10-CM

## 2012-11-09 DIAGNOSIS — I10 Essential (primary) hypertension: Secondary | ICD-10-CM

## 2012-11-09 DIAGNOSIS — J4489 Other specified chronic obstructive pulmonary disease: Secondary | ICD-10-CM

## 2012-11-09 DIAGNOSIS — Z6379 Other stressful life events affecting family and household: Secondary | ICD-10-CM

## 2012-11-09 DIAGNOSIS — S61459A Open bite of unspecified hand, initial encounter: Secondary | ICD-10-CM

## 2012-11-09 DIAGNOSIS — Z79899 Other long term (current) drug therapy: Secondary | ICD-10-CM

## 2012-11-09 DIAGNOSIS — M65839 Other synovitis and tenosynovitis, unspecified forearm: Secondary | ICD-10-CM | POA: Diagnosis present

## 2012-11-09 DIAGNOSIS — J449 Chronic obstructive pulmonary disease, unspecified: Secondary | ICD-10-CM | POA: Diagnosis present

## 2012-11-09 DIAGNOSIS — W540XXS Bitten by dog, sequela: Secondary | ICD-10-CM

## 2012-11-09 DIAGNOSIS — R072 Precordial pain: Secondary | ICD-10-CM

## 2012-11-09 DIAGNOSIS — R0789 Other chest pain: Secondary | ICD-10-CM

## 2012-11-09 LAB — CBC WITH DIFFERENTIAL/PLATELET
Basophils Absolute: 0 10*3/uL (ref 0.0–0.1)
Eosinophils Absolute: 0.1 10*3/uL (ref 0.0–0.7)
Lymphocytes Relative: 18 % (ref 12–46)
Lymphs Abs: 1.8 10*3/uL (ref 0.7–4.0)
Neutrophils Relative %: 69 % (ref 43–77)
Platelets: 186 10*3/uL (ref 150–400)
RBC: 4.84 MIL/uL (ref 4.22–5.81)
RDW: 13.1 % (ref 11.5–15.5)
WBC: 9.9 10*3/uL (ref 4.0–10.5)

## 2012-11-09 MED ORDER — VANCOMYCIN HCL IN DEXTROSE 1-5 GM/200ML-% IV SOLN
1000.0000 mg | Freq: Once | INTRAVENOUS | Status: AC
  Administered 2012-11-09: 1000 mg via INTRAVENOUS
  Filled 2012-11-09: qty 200

## 2012-11-09 NOTE — ED Provider Notes (Signed)
CSN: [id number]     Arrival date & time 11/09/12  2140 History   First MD Initiated Contact with Patient 11/09/12 2202     Chief Complaint  Patient presents with  . Wound Check   (Consider location/radiation/quality/duration/timing/severity/associated sxs/prior Treatment) HPI Brian Austin is a 79 y.o. male who presents to the ED for recheck of his finger that was injured due to a dog bite. He was evaluated here last night. The dog bite is located on the long finger of the left hand. The patient is right hand dominant. He was to follow up with the hand surgeon today in [place] but did not go. He rescheduled the appointment for tomorrow at 10:00am but has had increased pain and swelling to the area and is concerned because he is having difficulty moving his finger.  He is currently taking Augmentin.   Past Medical History  Diagnosis Date  . GERD (gastroesophageal reflux disease)   . Arthritis   . COPD (chronic obstructive pulmonary disease) 05/22/2008    FEV1/FVS 44.9  . Squamous cell carcinoma lung 2000    CT w/ CM; 04/25/2009 COPD, s/p RLL, CAD, 5 cm ascending aortic aneurysm  . Spontaneous pneumothorax   . BPH (benign prostatic hyperplasia)   . Hyperlipidemia   . Precordial pain   . Jaw pain   . CAD (coronary artery disease)     s/p Xience stent ot the distal RAC 2010, PCI with Xience stent to distal RCA at the [facility] 2011  . Hypertension   . Thoracic ascending aortic aneurysm     4.8 cm   . Diabetes mellitus     Well controlled  . Type II or unspecified type diabetes mellitus without mention of complication, not stated as uncontrolled    Past Surgical History  Procedure Laterality Date  . Lobectomy  2000    Right lower for squamous cell ca  . Needle removed from the right foot    . Lumbar laminectomy  1978  . Lymph node resected  1983    Axilla  . Spontaneous pneumothrax  1979  . Cardiac catheterization  2010    Stent  . Back surgery     Family History  Problem  Relation Age of Onset  . Heart disease Neg Hx   . Diabetes Father   . Alcohol abuse Father    History  Substance Use Topics  . Smoking status: Former Smoker    Quit date: 02/16/1984  . Smokeless tobacco: Not on file     Comment: Smoked for 30 years  . Alcohol Use: Yes     Comment: wine with supper    Review of Systems  Constitutional: Negative for fever and chills.  Musculoskeletal:       Swelling, redness and pain to the left middle finger  Skin: Positive for wound.  Neurological: Negative for dizziness and headaches.  Psychiatric/Behavioral: Negative for confusion. The patient is not nervous/anxious.     Allergies  Review of patient's allergies indicates no known allergies.  Home Medications   Current Outpatient Rx  Name  Route  Sig  Dispense  Refill  . amoxicillin-clavulanate (AUGMENTIN) 875-125 MG per tablet   Oral   Take 1 tablet by mouth 2 (two) times daily. One po bid x 7 days   14 tablet   0   . aspirin EC 81 MG tablet   Oral   Take 81 mg by mouth daily.         . fish oil-omega-3  fatty acids 1000 MG capsule   Oral   Take 3 g by mouth daily.          Marland Kitchen glucosamine-chondroitin 500-400 MG tablet   Oral   Take 1 tablet by mouth 2 (two) times daily.          Marland Kitchen HYDROcodone-acetaminophen (NORCO/VICODIN) 5-325 MG per tablet   Oral   Take 1 tablet by mouth at bedtime.          Marland Kitchen losartan (COZAAR) 50 MG tablet   Oral   Take 50 mg by mouth daily.          . magnesium oxide (MAG-OX) 400 MG tablet   Oral   Take 400 mg by mouth daily.         . Tamsulosin HCl (FLOMAX) 0.4 MG CAPS   Oral   Take 0.4 mg by mouth daily.          . vitamin B-12 (CYANOCOBALAMIN) 100 MCG tablet   Oral   Take 50 mcg by mouth daily.          BP 134/59  Pulse 79  Temp(Src) 98.7 F (37.1 C) (Oral)  Resp 20  Ht 5\' 8"  (1.727 m)  Wt 165 lb (74.844 kg)  BMI 25.09 kg/m2  SpO2 95% Physical Exam  Nursing note and vitals reviewed. Constitutional: He is oriented  to person, place, and time. He appears well-developed and well-nourished. No distress.  HENT:  Head: Normocephalic.  Eyes: EOM are normal.  Neck: Neck supple.  Cardiovascular: Normal rate.   Pulmonary/Chest: Effort normal.  Musculoskeletal:       Left hand: He exhibits decreased range of motion, tenderness and swelling. He exhibits normal capillary refill.       Hands: There is a puncture site noted where the dog bite occurred. There is swelling, erythemaand tenderness of the middle finger with red streaks going up the forearm.   Neurological: He is alert and oriented to person, place, and time. No cranial nerve deficit.  Skin: Skin is warm and dry.  Psychiatric: He has a normal mood and affect. His behavior is normal.    ED Course  Procedures (including critical care time) Labs Review Labs Reviewed - No data to display Imaging Review Dg Hand Complete Left  11/08/2012   CLINICAL DATA:  Pain post dog bite  EXAM: LEFT HAND - COMPLETE 3+ VIEW  COMPARISON:  None.  FINDINGS: Frontal, oblique, and lateral views were obtained. There is no apparent fracture or dislocation. There is osteoarthritic change in the 1st MCP joint as well as in all PIP and DIP joints.  There is no erosive change. No bony destruction. No radiopaque foreign body.  IMPRESSION: Multifocal osteoarthritic change. No fracture or bony destruction. No radiopaque foreign body.   Electronically Signed   By: Bretta Bang   On: 11/08/2012 21:28    MDM  79 y.o. male with tenosynovitis, Dr. Hyacinth Meeker in to examine the patient. Call to Hand surgeon on call and will transfer the patient to [facility] ED for admission. Discussed plan of care with the patient and he agrees.      Janne Napoleon, [place] 11/09/12 2252

## 2012-11-09 NOTE — ED Notes (Signed)
Received report from off-going RN.  Report given to Ines Bloomer, RN with [facility].

## 2012-11-09 NOTE — ED Notes (Signed)
Pt returns to ED secondary to a dog bite on 9/24. Pt was evaluated yesterday for same dog bite. Pt now has red streaks up left forearm to elbow. Left hand swollen, left index and middle finger swollen from tip of finger in to hand. Radial pulse present and strong. Pt denies fever, N/V. NAD noted at this time.

## 2012-11-09 NOTE — ED Notes (Signed)
Increased pain, swelling and redness to injured finger and hand.  Scheduled for follow-up visit in [place] today - did not go to appt due to weather.  Has rescheduled appt for tomorrow at 10:00 am.  Concerned that finger has increase in swelling / pain

## 2012-11-09 NOTE — ED Provider Notes (Signed)
Pt with dog bite to the L middle finger - has had progressive swellign and redness and now can't move the L mid finger - has red streaking up the arm on teh L.  No fever, no vomiting - taking Augmentin.  Needs hand involvement - will see in the AM - needs consult tonight.    I have discussed care with Dr. Janee Morn, will see at [facility] in ED - he has asked for NPO status, Abx and is in agreement with Vanc.    D/w Dr. Romeo Apple in ED - if pt needs admission, will need [facility] admit, if not can go with Dr. Carollee Massed reccomendations.  Medical screening examination/treatment/procedure(s) were conducted as a shared visit with non-physician practitioner(s) and myself.  I personally evaluated the patient during the encounter.  Clinical Impression: Left middle finger flexor tenosynovitis      Vida Roller, MD 11/09/12 2251

## 2012-11-09 NOTE — ED Provider Notes (Signed)
Medical screening examination/treatment/procedure(s) were conducted as a shared visit with non-physician practitioner(s) and myself.  I personally evaluated the patient during the encounter  Please see my separate respective documentation pertaining to this patient encounter]   Vida Roller, MD 11/09/12 2320

## 2012-11-10 ENCOUNTER — Encounter (HOSPITAL_COMMUNITY)
Admission: EM | Disposition: A | Discharge status: Home / Self Care | Payer: Self-pay | Source: Home / Self Care | Attending: Internal Medicine

## 2012-11-10 ENCOUNTER — Encounter (HOSPITAL_COMMUNITY): Payer: Self-pay | Admitting: Certified Registered Nurse Anesthetist

## 2012-11-10 ENCOUNTER — Ambulatory Visit: Admit: 2012-11-10 | Payer: Self-pay | Admitting: Orthopedic Surgery

## 2012-11-10 ENCOUNTER — Inpatient Hospital Stay (HOSPITAL_COMMUNITY): Payer: Medicare Other | Admitting: Certified Registered Nurse Anesthetist

## 2012-11-10 DIAGNOSIS — J449 Chronic obstructive pulmonary disease, unspecified: Secondary | ICD-10-CM | POA: Diagnosis not present

## 2012-11-10 DIAGNOSIS — L02519 Cutaneous abscess of unspecified hand: Principal | ICD-10-CM

## 2012-11-10 DIAGNOSIS — S61459A Open bite of unspecified hand, initial encounter: Secondary | ICD-10-CM

## 2012-11-10 DIAGNOSIS — I10 Essential (primary) hypertension: Secondary | ICD-10-CM

## 2012-11-10 DIAGNOSIS — I2510 Atherosclerotic heart disease of native coronary artery without angina pectoris: Secondary | ICD-10-CM

## 2012-11-10 DIAGNOSIS — S61409A Unspecified open wound of unspecified hand, initial encounter: Secondary | ICD-10-CM | POA: Diagnosis not present

## 2012-11-10 DIAGNOSIS — E119 Type 2 diabetes mellitus without complications: Secondary | ICD-10-CM | POA: Diagnosis not present

## 2012-11-10 DIAGNOSIS — I259 Chronic ischemic heart disease, unspecified: Secondary | ICD-10-CM

## 2012-11-10 DIAGNOSIS — IMO0002 Reserved for concepts with insufficient information to code with codable children: Secondary | ICD-10-CM

## 2012-11-10 HISTORY — PX: I & D EXTREMITY: SHX5045

## 2012-11-10 LAB — BASIC METABOLIC PANEL
CO2: 26 mEq/L (ref 19–32)
Calcium: 8.5 mg/dL (ref 8.4–10.5)
Chloride: 103 mEq/L (ref 96–112)
GFR calc Af Amer: >90 mL/min (ref >90)
Glucose, Bld: 108 mg/dL — ABNORMAL HIGH (ref 70–99)
Potassium: 4.2 mEq/L (ref 3.5–5.1)
Sodium: 139 mEq/L (ref 135–145)

## 2012-11-10 LAB — GRAM STAIN

## 2012-11-10 SURGERY — IRRIGATION AND DEBRIDEMENT EXTREMITY
Anesthesia: General | Site: Hand | Laterality: Left | Wound class: Dirty or Infected

## 2012-11-10 MED ORDER — PHENYLEPHRINE HCL 10 MG/ML IJ SOLN
10.0000 mg | INTRAVENOUS | Status: DC | PRN
  Administered 2012-11-10: 10 ug/min via INTRAVENOUS

## 2012-11-10 MED ORDER — PROPOFOL 10 MG/ML IV BOLUS
INTRAVENOUS | Status: DC | PRN
  Administered 2012-11-10: 120 mg via INTRAVENOUS

## 2012-11-10 MED ORDER — LIDOCAINE HCL (CARDIAC) 20 MG/ML IV SOLN
INTRAVENOUS | Status: DC | PRN
  Administered 2012-11-10: 40 mg via INTRAVENOUS

## 2012-11-10 MED ORDER — MELOXICAM 15 MG PO TABS: 15.0000 mg | ORAL_TABLET | Freq: Every day | ORAL | Status: DC

## 2012-11-10 MED ORDER — SODIUM CHLORIDE 0.9 % IV SOLN: 250.0000 mL | INTRAVENOUS | Status: DC | PRN

## 2012-11-10 MED ORDER — ONDANSETRON HCL 4 MG PO TABS: 4.0000 mg | ORAL_TABLET | Freq: Four times a day (QID) | ORAL | Status: DC | PRN

## 2012-11-10 MED ORDER — DROPERIDOL 2.5 MG/ML IJ SOLN: 0.6250 mg | INTRAMUSCULAR | Status: DC | PRN

## 2012-11-10 MED ORDER — ONDANSETRON HCL 4 MG/2ML IJ SOLN
INTRAMUSCULAR | Status: DC | PRN
  Administered 2012-11-10: 4 mg via INTRAVENOUS

## 2012-11-10 MED ORDER — SODIUM CHLORIDE 0.9 % IV SOLN
3.0000 g | Freq: Three times a day (TID) | INTRAVENOUS | Status: DC
  Administered 2012-11-10 – 2012-11-11 (×4): 3 g via INTRAVENOUS
  Filled 2012-11-10 (×7): qty 3

## 2012-11-10 MED ORDER — FENTANYL CITRATE 0.05 MG/ML IJ SOLN
INTRAMUSCULAR | Status: DC | PRN
  Administered 2012-11-10 (×2): 50 ug via INTRAVENOUS

## 2012-11-10 MED ORDER — ONDANSETRON HCL 4 MG/2ML IJ SOLN: 4.0000 mg | Freq: Four times a day (QID) | INTRAMUSCULAR | Status: DC | PRN

## 2012-11-10 MED ORDER — MELOXICAM 15 MG PO TABS
15.0000 mg | ORAL_TABLET | Freq: Every day | ORAL | Status: DC
  Administered 2012-11-11: 15 mg via ORAL
  Filled 2012-11-10: qty 1

## 2012-11-10 MED ORDER — SODIUM CHLORIDE 0.9 % IJ SOLN
3.0000 mL | Freq: Two times a day (BID) | INTRAMUSCULAR | Status: DC
  Administered 2012-11-10: 3 mL via INTRAVENOUS

## 2012-11-10 MED ORDER — HYDROMORPHONE HCL PF 1 MG/ML IJ SOLN: 1.0000 mg | INTRAMUSCULAR | Status: DC | PRN

## 2012-11-10 MED ORDER — FENTANYL CITRATE 0.05 MG/ML IJ SOLN
25.0000 ug | INTRAMUSCULAR | Status: DC | PRN
  Administered 2012-11-10: 50 ug via INTRAVENOUS

## 2012-11-10 MED ORDER — MORPHINE SULFATE 4 MG/ML IJ SOLN
4.0000 mg | Freq: Once | INTRAMUSCULAR | Status: AC
  Administered 2012-11-10: 4 mg via INTRAVENOUS
  Filled 2012-11-10: qty 1

## 2012-11-10 MED ORDER — CLINDAMYCIN HCL 300 MG PO CAPS: 300.0000 mg | ORAL_CAPSULE | Freq: Three times a day (TID) | ORAL | Status: DC

## 2012-11-10 MED ORDER — SODIUM CHLORIDE 0.9 % IJ SOLN: 3.0000 mL | INTRAMUSCULAR | Status: DC | PRN

## 2012-11-10 MED ORDER — SODIUM CHLORIDE 0.9 % IV SOLN
3.0000 g | Freq: Once | INTRAVENOUS | Status: AC
  Administered 2012-11-10: 3 g via INTRAVENOUS
  Filled 2012-11-10 (×2): qty 3

## 2012-11-10 MED ORDER — ONDANSETRON HCL 4 MG/2ML IJ SOLN: 4.0000 mg | Freq: Three times a day (TID) | INTRAMUSCULAR | Status: DC | PRN

## 2012-11-10 MED ORDER — PHENYLEPHRINE HCL 10 MG/ML IJ SOLN
INTRAMUSCULAR | Status: DC | PRN
  Administered 2012-11-10 (×3): 80 ug via INTRAVENOUS

## 2012-11-10 MED ORDER — HYDROCODONE-ACETAMINOPHEN 5-325 MG PO TABS: 1.0000 | ORAL_TABLET | Freq: Four times a day (QID) | ORAL | Status: DC | PRN

## 2012-11-10 MED ORDER — LACTATED RINGERS IV SOLN
INTRAVENOUS | Status: DC | PRN
  Administered 2012-11-10: 16:00:00 via INTRAVENOUS

## 2012-11-10 MED ORDER — FENTANYL CITRATE 0.05 MG/ML IJ SOLN
INTRAMUSCULAR | Status: AC
  Filled 2012-11-10: qty 2

## 2012-11-10 MED ORDER — MORPHINE SULFATE 2 MG/ML IJ SOLN
2.0000 mg | INTRAMUSCULAR | Status: DC | PRN
  Administered 2012-11-10 – 2012-11-11 (×3): 2 mg via INTRAVENOUS
  Filled 2012-11-10 (×3): qty 1

## 2012-11-10 MED ORDER — SODIUM CHLORIDE 0.9 % IR SOLN
Status: DC | PRN
  Administered 2012-11-10: 1000 mL

## 2012-11-10 MED ORDER — LACTATED RINGERS IV SOLN
INTRAVENOUS | Status: DC
  Administered 2012-11-10: 16:00:00 via INTRAVENOUS

## 2012-11-10 MED ORDER — KCL IN DEXTROSE-NACL 20-5-0.9 MEQ/L-%-% IV SOLN
INTRAVENOUS | Status: DC
  Administered 2012-11-10: 14:00:00 via INTRAVENOUS
  Filled 2012-11-10: qty 1000

## 2012-11-10 MED ORDER — CLINDAMYCIN PHOSPHATE 600 MG/50ML IV SOLN
600.0000 mg | Freq: Three times a day (TID) | INTRAVENOUS | Status: DC
  Administered 2012-11-10 – 2012-11-11 (×5): 600 mg via INTRAVENOUS
  Filled 2012-11-10 (×7): qty 50

## 2012-11-10 SURGICAL SUPPLY — 49 items
BANDAGE ELASTIC 3 VELCRO ST LF (GAUZE/BANDAGES/DRESSINGS) IMPLANT
BANDAGE ELASTIC 4 VELCRO ST LF (GAUZE/BANDAGES/DRESSINGS) ×2 IMPLANT
BANDAGE GAUZE ELAST BULKY 4 IN (GAUZE/BANDAGES/DRESSINGS) ×2 IMPLANT
BNDG CMPR 9X4 STRL LF SNTH (GAUZE/BANDAGES/DRESSINGS)
BNDG COHESIVE 4X5 TAN STRL (GAUZE/BANDAGES/DRESSINGS) ×1 IMPLANT
BNDG ESMARK 4X9 LF (GAUZE/BANDAGES/DRESSINGS) IMPLANT
CHLORAPREP W/TINT 26ML (MISCELLANEOUS) ×2 IMPLANT
CLOTH BEACON ORANGE TIMEOUT ST (SAFETY) ×2 IMPLANT
COVER SURGICAL LIGHT HANDLE (MISCELLANEOUS) ×2 IMPLANT
CUFF TOURNIQUET SINGLE 18IN (TOURNIQUET CUFF) ×2 IMPLANT
CUFF TOURNIQUET SINGLE 24IN (TOURNIQUET CUFF) IMPLANT
DRAPE SURG 17X23 STRL (DRAPES) ×2 IMPLANT
DRSG ADAPTIC 3X8 NADH LF (GAUZE/BANDAGES/DRESSINGS) ×2 IMPLANT
DRSG EMULSION OIL 3X3 NADH (GAUZE/BANDAGES/DRESSINGS) ×1 IMPLANT
ELECT REM PT RETURN 9FT ADLT (ELECTROSURGICAL)
ELECTRODE REM PT RTRN 9FT ADLT (ELECTROSURGICAL) IMPLANT
EVACUATOR 1/8 PVC DRAIN (DRAIN) IMPLANT
GAUZE PACKING IODOFORM 1/4X5 (PACKING) ×1 IMPLANT
GLOVE BIO SURGEON STRL SZ7.5 (GLOVE) ×2 IMPLANT
GLOVE BIO SURGEON STRL SZ8 (GLOVE) ×1 IMPLANT
GLOVE BIOGEL PI IND STRL 6.5 (GLOVE) IMPLANT
GLOVE BIOGEL PI IND STRL 8 (GLOVE) ×1 IMPLANT
GLOVE BIOGEL PI INDICATOR 6.5 (GLOVE) ×1
GLOVE BIOGEL PI INDICATOR 8 (GLOVE) ×1
GLOVE SURG SS PI 6.0 STRL IVOR (GLOVE) ×1 IMPLANT
GOWN PREVENTION PLUS XXLARGE (GOWN DISPOSABLE) ×2 IMPLANT
GOWN STRL NON-REIN LRG LVL3 (GOWN DISPOSABLE) ×6 IMPLANT
HANDPIECE INTERPULSE COAX TIP (DISPOSABLE)
KIT BASIN OR (CUSTOM PROCEDURE TRAY) ×2 IMPLANT
KIT ROOM TURNOVER OR (KITS) ×2 IMPLANT
MANIFOLD NEPTUNE II (INSTRUMENTS) ×2 IMPLANT
NS IRRIG 1000ML POUR BTL (IV SOLUTION) ×2 IMPLANT
PACK ORTHO EXTREMITY (CUSTOM PROCEDURE TRAY) ×2 IMPLANT
PAD ARMBOARD 7.5X6 YLW CONV (MISCELLANEOUS) ×4 IMPLANT
PAD CAST 4YDX4 CTTN HI CHSV (CAST SUPPLIES) IMPLANT
PADDING CAST COTTON 4X4 STRL (CAST SUPPLIES) ×2
SET HNDPC FAN SPRY TIP SCT (DISPOSABLE) IMPLANT
SPONGE GAUZE 4X4 12PLY (GAUZE/BANDAGES/DRESSINGS) ×2 IMPLANT
SPONGE LAP 18X18 X RAY DECT (DISPOSABLE) IMPLANT
STOCKINETTE IMPERVIOUS 9X36 MD (GAUZE/BANDAGES/DRESSINGS) IMPLANT
SUT ETHILON 4 0 PS 2 18 (SUTURE) IMPLANT
SUT VICRYL RAPIDE 4/0 PS 2 (SUTURE) IMPLANT
TOWEL OR 17X24 6PK STRL BLUE (TOWEL DISPOSABLE) ×2 IMPLANT
TOWEL OR 17X26 10 PK STRL BLUE (TOWEL DISPOSABLE) ×2 IMPLANT
TUBE ANAEROBIC SPECIMEN COL (MISCELLANEOUS) IMPLANT
TUBE CONNECTING 12X1/4 (SUCTIONS) ×2 IMPLANT
UNDERPAD 30X30 INCONTINENT (UNDERPADS AND DIAPERS) ×2 IMPLANT
WATER STERILE IRR 1000ML POUR (IV SOLUTION) ×2 IMPLANT
YANKAUER SUCT BULB TIP NO VENT (SUCTIONS) ×2 IMPLANT

## 2012-11-10 NOTE — Progress Notes (Signed)
Progress Note  Patient appeared to have had subcutaneous abscess of L LF from dog bite.  I&D done, and packing placed. OK from my perspective for patient to be d/c home tomorrow evening, after receiving continued IV antibiotics. Pt has Augmentin already, and new Rx on chart to add Clindamycin. Rx also on chart for pain med and anti-inflammatory.  F/u with me in my office at [facility] on Monday to pull packing and inspect wound, and check culture results.  Brian Austin A. 11/10/2012, 5:13 PM

## 2012-11-10 NOTE — Progress Notes (Signed)
Sleeping. Scant, if any improvement over past 14 hours with IV antibiotics. Will proceed with I&D of digit, obtaining cultures to help medicine guide antibiotic selection/duration.

## 2012-11-10 NOTE — Anesthesia Postprocedure Evaluation (Signed)
  Anesthesia Post-op Note  Patient: Brian Austin  Procedure(s) Performed: Procedure(s): IRRIGATION AND DEBRIDEMENT Left third-long finger  (Left)  Patient Location: PACU  Anesthesia Type:General  Level of Consciousness: awake, alert , oriented and patient cooperative  Airway and Oxygen Therapy: Patient Spontanous Breathing  Post-op Pain: none  Post-op Assessment: Post-op Vital signs reviewed, Patient's Cardiovascular Status Stable, Respiratory Function Stable, Patent Airway, No signs of Nausea or vomiting and Pain level controlled  Post-op Vital Signs: Reviewed and stable  Complications: No apparent anesthesia complications

## 2012-11-10 NOTE — Anesthesia Procedure Notes (Signed)
Procedure Name: LMA Insertion Date/Time: 11/10/2012 4:18 PM Performed by: Angelica Pou Pre-anesthesia Checklist: Patient identified, Timeout performed, Emergency Drugs available, Suction available and Patient being monitored Patient Re-evaluated:Patient Re-evaluated prior to inductionOxygen Delivery Method: Circle system utilized Preoxygenation: Pre-oxygenation with 100% oxygen Intubation Type: IV induction LMA: LMA inserted LMA Size: 4.0 Number of attempts: 1 Dental Injury: Teeth and Oropharynx as per pre-operative assessment

## 2012-11-10 NOTE — Progress Notes (Signed)
79yo male was bitten by dog on Wednesday, was on Augmentin but c/o increasing pain, swelling, and redness, to begin IV ABX while awaiting surgical evaluation.  Will begin Unasyn 3g IV Q8H for CrCl ~60 ml/min (using prior labs) and monitor.  Vernard Gambles, PharmD, BCPS  11/10/2012 2:22 AM

## 2012-11-10 NOTE — Op Note (Signed)
11/09/2012 - 11/10/2012  4:59 PM  PATIENT:  Brian Austin  79 y.o. male  PRE-OPERATIVE DIAGNOSIS:  Left long finger abscess  POST-OPERATIVE DIAGNOSIS:  Same  PROCEDURE:  Left long finger incision and drainage of subcutaneous abscess  SURGEON: Cliffton Asters. Janee Morn, MD  PHYSICIAN ASSISTANT: None  ANESTHESIA:  general  SPECIMENS:  Swabs to microbiology  DRAINS:   None  PREOPERATIVE INDICATIONS:  Brian Austin is a  79 y.o. male with a diagnosis of left long finger infection who failed conservative measures and elected for surgical management.    The risks benefits and alternatives were discussed with the patient preoperatively including but not limited to the risks of infection, bleeding, nerve injury, cardiopulmonary complications, the need for revision surgery, among others, and the patient verbalized understanding and consented to proceed.  OPERATIVE IMPLANTS: None  OPERATIVE FINDINGS: Subcutaneous abscess with small amount of thick tan fluid  OPERATIVE PROCEDURE:  After receiving prophylactic antibiotics, the patient was escorted to the operative theatre and placed in a supine position. General anesthesia was administered and A surgical "time-out" was performed during which the planned procedure, proposed operative site, and the correct patient identity were compared to the operative consent and agreement confirmed by the circulating nurse according to current facility policy.  Following application of a tourniquet to the operative extremity, the exposed skin was prepped with Chloraprep and draped in the usual sterile fashion.  The limb was exsanguinated with gravity and the tourniquet inflated to approximately higher than systolic BP.  The incision on the radial aspect of the proximal phalanx was extended proximally and distally sharply with a scalpel. Subcutaneous tissues were dissected with spreading dissection. As the incision was made sent hand fluid under pressure was  encountered. It was thick. It was cultured. The region of the proximal phalanx was no longer tense or tight. There did not appear to be infection the tract outside of the immediate subcutaneous space overlying the flexor sheath in the region of the proximal phalanx. There appeared to be no extension into the flexor tendon sheath. The cavity was copiously irrigated, and iodoform packing placed, and the proximal and distal extensions of the wound closed with a single 4-0 Vicryl Rapide interrupted suture on each side. A bulky dressing was applied he was taken to the recovery in stable condition, breathing spontaneously.  DISPOSITION: He'll return to the floor for continued IV antibiotic management, ultimately switching to oral antibiotics upon discharge to be guided by cultures.

## 2012-11-10 NOTE — ED Notes (Signed)
Patient was taking his childs dog to the groomer and vet, when he was finished the dog picked up some kind of cement in a tube in his mouth. The owner wanted him to drop this and the dog bit him

## 2012-11-10 NOTE — Anesthesia Preprocedure Evaluation (Addendum)
Anesthesia Evaluation  Patient identified by MRN, date of birth, ID band Patient awake    Reviewed: Allergy & Precautions, H&P , NPO status , Patient's Chart, lab work & pertinent test results  History of Anesthesia Complications Negative for: history of anesthetic complications  Airway Mallampati: II TM Distance: >3 FB Neck ROM: Full    Dental  (+) Edentulous Upper, Partial Lower, Dental Advisory Given and Poor Dentition   Pulmonary COPDformer smoker (quit 1980's),  Lung cancer: s/p lobectomy breath sounds clear to auscultation  Pulmonary exam normal       Cardiovascular hypertension, Pt. on medications + CAD, + Cardiac Stents and + Peripheral Vascular Disease (ascending aorta aneurysm 4.8cm) Rhythm:Regular Rate:Normal  Thoracic ascending aneurysm 4.8cm   Neuro/Psych negative neurological ROS     GI/Hepatic Neg liver ROS, GERD-  Medicated and Controlled,  Endo/Other  diabetes (glu 108), Type 2(pt denies DM)  Renal/GU negative Renal ROS     Musculoskeletal   Abdominal   Peds  Hematology   Anesthesia Other Findings   Reproductive/Obstetrics                       Anesthesia Physical Anesthesia Plan  ASA: III  Anesthesia Plan:    Post-op Pain Management:    Induction: Intravenous  Airway Management Planned: LMA  Additional Equipment:   Intra-op Plan:   Post-operative Plan:   Informed Consent: I have reviewed the patients History and Physical, chart, labs and discussed the procedure including the risks, benefits and alternatives for the proposed anesthesia with the patient or authorized representative who has indicated his/her understanding and acceptance.   Dental advisory given  Plan Discussed with: CRNA and Surgeon  Anesthesia Plan Comments: (Plan routine monitors, GA- LMA OK)        Anesthesia Quick Evaluation

## 2012-11-10 NOTE — Progress Notes (Signed)
Patient arrived to unit 6N26. Alert and oriented. C/o right shoulder pain he states is r/t rotator cuff issues.  Pain meds given in ED before transfer to floor. Left hand red and swollen. VSS.Oriented to room and surroundings.

## 2012-11-10 NOTE — ED Notes (Signed)
Dr. Thompson at bedside. 

## 2012-11-10 NOTE — ED Provider Notes (Signed)
Patient seen after transfer from [facility]. Patient with dog bite to left hand on 924.  Despite oral antibiotics, he has had persistent redness, swelling, and pain.  Patient was transferred for hand surgeon evaluation.  Dr. Janee Morn has seen him, and does not feel he requires OR treatment at this time, recommends admission to hospitalist service for IV antibiotics.  He requests patient is made n.p.o. after breakfast, and he will recheck him tomorrow afternoon for possible I&D.  Olivia Mackie, MD 11/10/12 [phone]

## 2012-11-10 NOTE — Preoperative (Signed)
Beta Blockers   Reason not to administer Beta Blockers:Not Applicable 

## 2012-11-10 NOTE — Progress Notes (Signed)
[facility] PROGRESS NOTE  Brian Austin:[id number] DOB: 1931-01-27 DOA: 11/09/2012 PCP: Default, Provider, MD  Assessment/Plan: 79 y.o. With Diabetes, COPD, CAD s/p PTCA, h/o Lung CA  admitted after he was bitten by a dog Wednesday around noon. He was eval'd in [place],but missed f/u appt with Dr. Melvyn Novas Thursday. He went back to [facility] Thursday night because of increasing pain, swelling, redness in spite of being on Augmentin -admitted for IV atx; ? Surgery    1. Cellulitis and abscess of hand s/p dog bite  -cont IV atx; ? Surgery per ortho  -npo per ortho; IVF gentle ;  -No pharmacological VTE prophylaxis per ortho request   2. CAD s/p PTCA stable; cont hoem regimen   3. COPD stable; cont monitoring   4. Type II or unspecified type diabetes mellitus without mention of complication, not stated as uncontrolled    Code Status: full  Family Communication: brother, sister at the bedside  (indicate person spoken with, relationship, and if by phone, the number) Disposition Plan: home    Consultants:  Orthopedics   Procedures:  X ray   Antibiotics: Augmentin 9/24<<>>9/25  Unasyn 9/25<<< (indicate start date, and stop date if known)  HPI/Subjective: Afebrile   Objective: Filed Vitals:   11/10/12 0540  BP: 122/58  Pulse: 67  Temp: 98 F (36.7 C)  Resp: 20    Intake/Output Summary (Last 24 hours) at 11/10/12 1202 Last data filed at 11/10/12 0900  Gross per 24 hour  Intake    100 ml  Output      0 ml  Net    100 ml   Filed Weights   11/09/12 2150 11/10/12 0058  Weight: 74.844 kg (165 lb) 74.844 kg (165 lb)    Exam:   General:  Alert   Cardiovascular: s1,s2 rrr  Respiratory: cta BL   Abdomen: soft, NT   Musculoskeletal: no LE edema    Data Reviewed: Basic Metabolic Panel:  Recent Labs Lab 11/10/12 0500  NA 139  K 4.2  CL 103  CO2 26  GLUCOSE 108*  BUN 15  CREATININE 0.68  CALCIUM 8.5   Liver Function Tests: No results  found for this basename: AST, ALT, ALKPHOS, BILITOT, PROT, ALBUMIN,  in the last 168 hours No results found for this basename: LIPASE, AMYLASE,  in the last 168 hours No results found for this basename: AMMONIA,  in the last 168 hours CBC:  Recent Labs Lab 11/09/12 2236  WBC 9.9  NEUTROABS 6.8  HGB 14.3  HCT 43.8  MCV 90.5  PLT 186   Cardiac Enzymes: No results found for this basename: CKTOTAL, CKMB, CKMBINDEX, TROPONINI,  in the last 168 hours BNP (last 3 results) No results found for this basename: PROBNP,  in the last 8760 hours CBG: No results found for this basename: GLUCAP,  in the last 168 hours  No results found for this or any previous visit (from the past 240 hour(s)).   Studies: Dg Hand Complete Left  11/08/2012   CLINICAL DATA:  Pain post dog bite  EXAM: LEFT HAND - COMPLETE 3+ VIEW  COMPARISON:  None.  FINDINGS: Frontal, oblique, and lateral views were obtained. There is no apparent fracture or dislocation. There is osteoarthritic change in the 1st MCP joint as well as in all PIP and DIP joints.  There is no erosive change. No bony destruction. No radiopaque foreign body.  IMPRESSION: Multifocal osteoarthritic change. No fracture or bony destruction. No radiopaque foreign body.  Electronically Signed   By: Bretta Bang   On: 11/08/2012 21:28    Scheduled Meds: . ampicillin-sulbactam (UNASYN) IV  3 g Intravenous Q8H  . clindamycin (CLEOCIN) IV  600 mg Intravenous Q8H  . sodium chloride  3 mL Intravenous Q12H   Continuous Infusions:   Principal Problem:   Cellulitis and abscess of hand Active Problems:   HYPERTENSION   CORONARY ARTERY DISEASE, S/P PTCA   COPD   LUNG CANCER, HX OF   Type II or unspecified type diabetes mellitus without mention of complication, not stated as uncontrolled   Dog bite of hand    Time spent: > 35 minutes     Esperanza Sheets  [facility] Pager [phone]. If 7PM-7AM, please contact night-coverage at [email or web address],  password [facility] 11/10/2012, 12:02 PM  LOS: 1 day

## 2012-11-10 NOTE — Transfer of Care (Signed)
Immediate Anesthesia Transfer of Care Note  Patient: Brian Austin  Procedure(s) Performed: Procedure(s): IRRIGATION AND DEBRIDEMENT Left third-long finger  (Left)  Patient Location: PACU  Anesthesia Type:General  Level of Consciousness: awake, alert , oriented and patient cooperative  Airway & Oxygen Therapy: Patient Spontanous Breathing and Patient connected to nasal cannula oxygen  Post-op Assessment: Report given to PACU RN, Post -op Vital signs reviewed and stable and Patient moving all extremities  Post vital signs: Reviewed and stable  Complications: No apparent anesthesia complications

## 2012-11-10 NOTE — Consult Note (Addendum)
ORTHOPAEDIC CONSULTATION HISTORY & PHYSICAL REQUESTING PHYSICIAN: No att. providers found  Chief Complaint: L LF dog bite  HPI: Brian Austin is a 79 y.o.RHD male with Diabetes who was bitten by a dog Wednesday around noon.  He was eval'd in [facility], had f/u appt with Dr. Melvyn Novas Thursday in the AM, but missed it due to transportation problems.  Went back to [facility] Thursday night because of increasing pain, swelling, redness in spite of being on Augmentin.  Transferred for acute hand surgical evaluation.   Past Medical History  Diagnosis Date  . GERD (gastroesophageal reflux disease)   . Arthritis   . COPD (chronic obstructive pulmonary disease) 05/22/2008    FEV1/FVS 44.9  . Squamous cell carcinoma lung 2000    CT w/ CM; 04/25/2009 COPD, s/p RLL, CAD, 5 cm ascending aortic aneurysm  . Spontaneous pneumothorax   . BPH (benign prostatic hyperplasia)   . Hyperlipidemia   . Precordial pain   . Jaw pain   . CAD (coronary artery disease)     s/p Xience stent ot the distal RAC 2010, PCI with Xience stent to distal RCA at the [facility] 2011  . Hypertension   . Thoracic ascending aortic aneurysm     4.8 cm   . Diabetes mellitus     Well controlled  . Type II or unspecified type diabetes mellitus without mention of complication, not stated as uncontrolled    Past Surgical History  Procedure Laterality Date  . Lobectomy  2000    Right lower for squamous cell ca  . Needle removed from the right foot    . Lumbar laminectomy  1978  . Lymph node resected  1983    Axilla  . Spontaneous pneumothrax  1979  . Cardiac catheterization  2010    Stent  . Back surgery     History   Social History  . Marital Status: Divorced    Spouse Name: N/A    Number of Children: N/A  . Years of Education: N/A   Occupational History  . Retired    Social History Main Topics  . Smoking status: Former Smoker    Quit date: 02/16/1984  . Smokeless tobacco: None     Comment: Smoked for 30 years  .  Alcohol Use: Yes     Comment: wine with supper  . Drug Use: No  . Sexual Activity: No   Other Topics Concern  . None   Social History Narrative   Retired Hotel manager   Divorced four times   3 children   Single   Family History  Problem Relation Age of Onset  . Heart disease Neg Hx   . Diabetes Father   . Alcohol abuse Father    No Known Allergies Prior to Admission medications   Medication Sig Start Date End Date Taking? Authorizing Provider  amoxicillin-clavulanate (AUGMENTIN) 875-125 MG per tablet Take 1 tablet by mouth 2 (two) times daily. One po bid x 7 days 11/08/12  Yes Benny Lennert, MD  aspirin EC 81 MG tablet Take 81 mg by mouth daily.   Yes Historical Provider, MD  fish oil-omega-3 fatty acids 1000 MG capsule Take 3 g by mouth daily.    Yes Historical Provider, MD  glucosamine-chondroitin 500-400 MG tablet Take 1 tablet by mouth 2 (two) times daily.    Yes Historical Provider, MD  HYDROcodone-acetaminophen (NORCO/VICODIN) 5-325 MG per tablet Take 1 tablet by mouth at bedtime.    Yes Historical Provider, MD  losartan (  COZAAR) 50 MG tablet Take 50 mg by mouth daily.    Yes Historical Provider, MD  magnesium oxide (MAG-OX) 400 MG tablet Take 400 mg by mouth daily.   Yes Historical Provider, MD  Tamsulosin HCl (FLOMAX) 0.4 MG CAPS Take 0.4 mg by mouth daily.    Yes Historical Provider, MD  vitamin B-12 (CYANOCOBALAMIN) 100 MCG tablet Take 50 mcg by mouth daily.   Yes Historical Provider, MD   Dg Hand Complete Left  11/08/2012   CLINICAL DATA:  Pain post dog bite  EXAM: LEFT HAND - COMPLETE 3+ VIEW  COMPARISON:  None.  FINDINGS: Frontal, oblique, and lateral views were obtained. There is no apparent fracture or dislocation. There is osteoarthritic change in the 1st MCP joint as well as in all PIP and DIP joints.  There is no erosive change. No bony destruction. No radiopaque foreign body.  IMPRESSION: Multifocal osteoarthritic change. No fracture or bony destruction. No  radiopaque foreign body.   Electronically Signed   By: Bretta Bang   On: 11/08/2012 21:28    Positive ROS: All other systems have been reviewed and were otherwise negative with the exception of those mentioned in the HPI and as above.  Physical Exam: Vitals: Refer to EMR. Constitutional:  WD, WN, NAD HEENT:  NCAT, EOMI Neuro/Psych:  Alert & oriented to person, place, and time; appropriate mood & affect Lymphatic: No generalized UE edema or lymphadenopathy Extremities / MSK:  The extremities are normal with respect to appearance, ranges of motion, joint stability, muscle strength/tone, sensation, & perfusion except as otherwise noted:   Small laceration along proximal phalanx, mid-axial line, of L LF.  Finger posture in near full extension.  Increased pain with further passive extension and active flexion.  +Redness of finger, and swollen, but not fusiform.  Intact LT sens at tip.  Some red streaking up FA  Assessment: L LF dog bite with progressive cellulitis and ascending lymphangitis, not presently with surgical indications.    Plan: Recommend admit for IV antibiotics and continued surveillance for resolution vs. Progression.  No pharmacological VTE prophylaxis please and NPO after breakfast for afternoon eval by me and surgery if then indicated.  Cliffton Asters Janee Morn, MD     Mobile [phone] Orthopaedic & Hand Surgery [facility] Orthopaedic & Sports Medicine [facility] [address] [place], [place]  [address] [phone]

## 2012-11-10 NOTE — H&P (Signed)
Chief Complaint:  Dog bite to hand  HPI: 79 yo male bitten by cocker spaniel 2 days ago to left index finger saw a doctor later the same evening because it started swelling up and was placed on augmentin.  He has been on augmentin almost 48 hours, and the finger has gotten more swollen and red.  The bite area has not been draining any pus or material, but does have a puncture wound to left index finger.  No fevers.  Hand surgeon already evaluated pt in ED here at [facility].  Given one dose of vancomycin in ED at [facility].    Review of Systems:  Positive and negative as per HPI otherwise all other systems are negative  Past Medical History: Past Medical History  Diagnosis Date  . GERD (gastroesophageal reflux disease)   . Arthritis   . COPD (chronic obstructive pulmonary disease) 05/22/2008    FEV1/FVS 44.9  . Squamous cell carcinoma lung 2000    CT w/ CM; 04/25/2009 COPD, s/p RLL, CAD, 5 cm ascending aortic aneurysm  . Spontaneous pneumothorax   . BPH (benign prostatic hyperplasia)   . Hyperlipidemia   . Precordial pain   . Jaw pain   . CAD (coronary artery disease)     s/p Xience stent ot the distal RAC 2010, PCI with Xience stent to distal RCA at the [facility] 2011  . Hypertension   . Thoracic ascending aortic aneurysm     4.8 cm   . Diabetes mellitus     Well controlled  . Type II or unspecified type diabetes mellitus without mention of complication, not stated as uncontrolled    Past Surgical History  Procedure Laterality Date  . Lobectomy  2000    Right lower for squamous cell ca  . Needle removed from the right foot    . Lumbar laminectomy  1978  . Lymph node resected  1983    Axilla  . Spontaneous pneumothrax  1979  . Cardiac catheterization  2010    Stent  . Back surgery      Medications: Prior to Admission medications   Medication Sig Start Date End Date Taking? Authorizing Provider  amoxicillin-clavulanate (AUGMENTIN) 875-125 MG per tablet Take 1 tablet by mouth 2  (two) times daily. One po bid x 7 days 11/08/12  Yes Benny Lennert, MD  aspirin EC 81 MG tablet Take 81 mg by mouth daily.   Yes Historical Provider, MD  fish oil-omega-3 fatty acids 1000 MG capsule Take 3 g by mouth daily.    Yes Historical Provider, MD  glucosamine-chondroitin 500-400 MG tablet Take 1 tablet by mouth 2 (two) times daily.    Yes Historical Provider, MD  HYDROcodone-acetaminophen (NORCO/VICODIN) 5-325 MG per tablet Take 1 tablet by mouth at bedtime.    Yes Historical Provider, MD  losartan (COZAAR) 50 MG tablet Take 50 mg by mouth daily.    Yes Historical Provider, MD  magnesium oxide (MAG-OX) 400 MG tablet Take 400 mg by mouth daily.   Yes Historical Provider, MD  Tamsulosin HCl (FLOMAX) 0.4 MG CAPS Take 0.4 mg by mouth daily.    Yes Historical Provider, MD  vitamin B-12 (CYANOCOBALAMIN) 100 MCG tablet Take 50 mcg by mouth daily.   Yes Historical Provider, MD    Allergies:  No Known Allergies  Social History:  reports that he quit smoking about 28 years ago. He does not have any smokeless tobacco history on file. He reports that  drinks alcohol. He reports that  he does not use illicit drugs.  Family History: Family History  Problem Relation Age of Onset  . Heart disease Neg Hx   . Diabetes Father   . Alcohol abuse Father     Physical Exam: Filed Vitals:   11/09/12 2150 11/09/12 2329 11/10/12 0058  BP: 134/59 143/72   Pulse: 79 80   Temp: 98.7 F (37.1 C) 99.1 F (37.3 C)   TempSrc: Oral Oral   Resp: 20 20   Height: 5\' 8"  (1.727 m)    Weight: 74.844 kg (165 lb)  74.844 kg (165 lb)  SpO2: 95% 95%    General appearance: alert, cooperative and no distress Head: Normocephalic, without obvious abnormality, atraumatic Eyes: negative Nose: Nares normal. Septum midline. Mucosa normal. No drainage or sinus tenderness. Neck: no JVD and supple, symmetrical, trachea midline Lungs: clear to auscultation bilaterally Heart: regular rate and rhythm, S1, S2 normal, no  murmur, click, rub or gallop Abdomen: soft, non-tender; bowel sounds normal; no masses,  no organomegaly Extremities: extremities normal, atraumatic, no cyanosis or edema Pulses: 2+ and symmetric Skin: Skin color, texture, turgor normal. No rashes or lesions except swollen red left index finger c/w cellulitis with puncture wound no induration or flunctuance felt. Neurologic: Grossly normal   Labs on Admission:    Recent Labs  11/09/12 2236  WBC 9.9  NEUTROABS 6.8  HGB 14.3  HCT 43.8  MCV 90.5  PLT 186   Radiological Exams on Admission: Dg Hand Complete Left  11/08/2012   CLINICAL DATA:  Pain post dog bite  EXAM: LEFT HAND - COMPLETE 3+ VIEW  COMPARISON:  None.  FINDINGS: Frontal, oblique, and lateral views were obtained. There is no apparent fracture or dislocation. There is osteoarthritic change in the 1st MCP joint as well as in all PIP and DIP joints.  There is no erosive change. No bony destruction. No radiopaque foreign body.  IMPRESSION: Multifocal osteoarthritic change. No fracture or bony destruction. No radiopaque foreign body.   Electronically Signed   By: Bretta Bang   On: 11/08/2012 21:28    Assessment/Plan  79 yo male with infected left hand/index finger dog bite Principal Problem:   Cellulitis and abscess of hand Active Problems:   HYPERTENSION   CORONARY ARTERY DISEASE, S/P PTCA   COPD   LUNG CANCER, HX OF   Type II or unspecified type diabetes mellitus without mention of complication, not stated as uncontrolled   Dog bite of hand  Place on unasyn.  Keep npo for possible OR tomorrow.  Med surg bed.  Julieth Tugman A 11/10/2012, 1:30 AM

## 2012-11-11 NOTE — Discharge Summary (Signed)
Physician Discharge Summary  Brian Austin YQM:[id number] DOB: April 11, 1930 DOA: 11/09/2012  PCP: Default, Provider, MD  Admit date: 11/09/2012 Discharge date: 11/11/2012  Time spent: > 35  minutes  Recommendations for Outpatient Follow-up:   -f/u with Dr. Janee Morn on Monday afternoon  -f/u with PCP in 1-2 week   Discharge Diagnoses:  Principal Problem:   Cellulitis and abscess of hand Active Problems:   HYPERTENSION   CORONARY ARTERY DISEASE, S/P PTCA   COPD   LUNG CANCER, HX OF   Type II or unspecified type diabetes mellitus without mention of complication, not stated as uncontrolled   Dog bite of hand   Discharge Condition: stable   Diet recommendation: heart healthy   Filed Weights   11/09/12 2150 11/10/12 0058  Weight: 74.844 kg (165 lb) 74.844 kg (165 lb)    History of present illness:  79 y.o. With Diabetes, COPD, CAD s/p PTCA, h/o Lung CA admitted after he was bitten by a dog Wednesday around noon. He was eval'd in [facility],but missed f/u appt with Dr. Melvyn Novas Thursday. He went back to [facility] Thursday night because of increasing pain, swelling, redness in spite of being on Augmentin  -admitted for IV atx; ? Surgery   Hospital Course:  1. Cellulitis and abscess of hand s/p dog bite  -s/p I&D by Surgery on 9/26; surgeyr recommended to f/u with him on Monday afternoon; d/ con PO atx (augment+clinda per surgery) 2. CAD s/p PTCA stable; cont hoem regimen  3. COPD stable; cont monitoring  4. Type II or unspecified type diabetes mellitus without mention of complication, not stated as uncontrolled       Procedures:  I&D (i.e. Studies not automatically included, echos, thoracentesis, etc; not x-rays)  Consultations:  Ortho   Discharge Exam: Filed Vitals:   11/11/12 1357  BP: 116/50  Pulse: 80  Temp: 98.5 F (36.9 C)  Resp: 16    General: alert  Cardiovascular: s1,s2 rrr Respiratory: cta BL   Discharge Instructions  Discharge Orders   Future  Appointments Provider Department Dept Phone   11/14/2012 9:00 AM [facility] [phone]   Patient to arrive 15 minutes prior to appointment time.   Future Orders Complete By Expires   Diet - low sodium heart healthy  As directed    Discharge instructions  As directed    Comments:     Please follow up with Dr. Janee Morn on Monday afternoon  [address] [place] [address] [phone]   Increase activity slowly  As directed        Medication List         amoxicillin-clavulanate 875-125 MG per tablet  Commonly known as:  AUGMENTIN  Take 1 tablet by mouth 2 (two) times daily. One po bid x 7 days     aspirin EC 81 MG tablet  Take 81 mg by mouth daily.     clindamycin 300 MG capsule  Commonly known as:  CLEOCIN  Take 1 capsule (300 mg total) by mouth 3 (three) times daily.     fish oil-omega-3 fatty acids 1000 MG capsule  Take 3 g by mouth daily.     FLOMAX 0.4 MG Caps capsule  Generic drug:  tamsulosin  Take 0.4 mg by mouth daily.     glucosamine-chondroitin 500-400 MG tablet  Take 1 tablet by mouth 2 (two) times daily.     HYDROcodone-acetaminophen 5-325 MG per tablet  Commonly known as:  NORCO  Take 1-2 tablets by mouth  every 6 (six) hours as needed for pain.     HYDROcodone-acetaminophen 5-325 MG per tablet  Commonly known as:  NORCO/VICODIN  Take 1 tablet by mouth at bedtime.     losartan 50 MG tablet  Commonly known as:  COZAAR  Take 50 mg by mouth daily.     magnesium oxide 400 MG tablet  Commonly known as:  MAG-OX  Take 400 mg by mouth daily.     meloxicam 15 MG tablet  Commonly known as:  MOBIC  Take 1 tablet (15 mg total) by mouth daily.     vitamin B-12 100 MCG tablet  Commonly known as:  CYANOCOBALAMIN  Take 50 mcg by mouth daily.       No Known Allergies     Follow-up Information   Follow up with Jodi Marble., MD. (Monday at 12:00 noon)    Specialty:  Orthopedic Surgery   Contact information:   [address]  [address] [place] [address] [phone]       Follow up with Default, Provider, MD In 2 weeks.   Contact information:   [address] [place] [address] [phone]        The results of significant diagnostics from this hospitalization (including imaging, microbiology, ancillary and laboratory) are listed below for reference.    Significant Diagnostic Studies: Dg Shoulder Right  11/04/2012   CLINICAL DATA:  Right shoulder pain. Fall 8 weeks ago.  EXAM: RIGHT SHOULDER - 2+ VIEW  COMPARISON:  10/31/2012  FINDINGS: Calcification tracking along the distal rotator cuff is suspicious for calcific tendinopathy. Mild degenerative AC joint arthropathy. No acute fracture or dislocation.  IMPRESSION: 1. Suspected calcific rotator cuff tendinopathy (chronic). 2. Mild degenerative AC joint arthropathy. 3. If pain persists despite conservative therapy, MRI may be warranted for further characterization.   Electronically Signed   By: Herbie Baltimore   On: 11/04/2012 10:19   Ct Angio Chest Pe W/cm &/or Wo Cm  10/14/2012   CLINICAL DATA:  COPD. Prior lung cancer. Are mild to cyst.  EXAM: CT ANGIOGRAPHY CHEST WITH CONTRAST  TECHNIQUE: Multidetector CT imaging of the chest was performed using the standard protocol during bolus administration of intravenous contrast. Multiplanar CT image reconstructions including MIPs were obtained to evaluate the vascular anatomy.  CONTRAST:  OMNIPAQUE IOHEXOL 350 MG/ML SOLN  COMPARISON:  12/28/2011  FINDINGS: Aneurysmal dilatation of the ascending thoracic aorta again noted measuring up to 4.8 cm, stable since prior study. Aortic arch and descending thoracic calcifications without aneurysm. Heart is borderline in size. Coronary artery calcifications. No filling defects in the pulmonary arteries to suggest pulmonary emboli.  No mediastinal, hilar, or axillary adenopathy. Visualized thyroid and chest wall soft tissues unremarkable.  Postsurgical changes in the right lower  lobe. No suspicious pulmonary nodules. No pleural effusions.  Imaging into the upper abdomen shows no acute findings.  No acute bony abnormality. Diffuse degenerative changes in the thoracic spine.  Review of the MIP images confirms the above findings.  IMPRESSION: No suspicious pulmonary nodule. No recurrent mass. No acute findings.  No evidence of pulmonary embolus.   Electronically Signed   By: Charlett Nose   On: 10/14/2012 21:10   Dg Hand Complete Left  11/08/2012   CLINICAL DATA:  Pain post dog bite  EXAM: LEFT HAND - COMPLETE 3+ VIEW  COMPARISON:  None.  FINDINGS: Frontal, oblique, and lateral views were obtained. There is no apparent fracture or dislocation. There is osteoarthritic change in the 1st MCP  joint as well as in all PIP and DIP joints.  There is no erosive change. No bony destruction. No radiopaque foreign body.  IMPRESSION: Multifocal osteoarthritic change. No fracture or bony destruction. No radiopaque foreign body.   Electronically Signed   By: Bretta Bang   On: 11/08/2012 21:28    Microbiology: Recent Results (from the past 240 hour(s))  ANAEROBIC CULTURE     Status: None   Collection Time    11/10/12  4:36 PM      Result Value Range Status   Specimen Description WOUND FINGER LEFT   Final   Special Requests     Final   Value: PATIENT ON FOLLOWING CLINDAMYCIN UNASYN PUS FROM LEFT THIRD FINGER   Gram Stain     Final   Value: MODERATE WBC PRESENT,BOTH PMN AND MONONUCLEAR     NO ORGANISMS SEEN     Performed at [facility]     Performed at [facility]   Culture     Final   Value: NO ANAEROBES ISOLATED; CULTURE IN PROGRESS FOR 5 DAYS     Performed at [facility]   Report Status PENDING   Incomplete  WOUND CULTURE     Status: None   Collection Time    11/10/12  4:36 PM      Result Value Range Status   Specimen Description WOUND FINGER LEFT   Final   Special Requests     Final   Value: PATIENT ON FOLLOWING CLINDAMYCIN UNASYN PUS FROM LEFT  THIRD FINGER   Gram Stain     Final   Value: MODERATE WBC PRESENT,BOTH PMN AND MONONUCLEAR     NO ORGANISMS SEEN     Performed at [facility]     Performed at [facility]   Culture     Final   Value: NO GROWTH 1 DAY     Performed at [facility]   Report Status PENDING   Incomplete  GRAM STAIN     Status: None   Collection Time    11/10/12  4:36 PM      Result Value Range Status   Specimen Description WOUND FINGER LEFT   Final   Special Requests     Final   Value: PATIENT ON FOLLOWING CLINDAMYCIN UNASYN PUS FROM LEFT THIRD FINGER   Gram Stain     Final   Value: MODERATE WBC PRESENT,BOTH PMN AND MONONUCLEAR     NO ORGANISMS SEEN   Report Status 11/10/2012 FINAL   Final     Labs: Basic Metabolic Panel:  Recent Labs Lab 11/10/12 0500  NA 139  K 4.2  CL 103  CO2 26  GLUCOSE 108*  BUN 15  CREATININE 0.68  CALCIUM 8.5   Liver Function Tests: No results found for this basename: AST, ALT, ALKPHOS, BILITOT, PROT, ALBUMIN,  in the last 168 hours No results found for this basename: LIPASE, AMYLASE,  in the last 168 hours No results found for this basename: AMMONIA,  in the last 168 hours CBC:  Recent Labs Lab 11/09/12 2236  WBC 9.9  NEUTROABS 6.8  HGB 14.3  HCT 43.8  MCV 90.5  PLT 186   Cardiac Enzymes: No results found for this basename: CKTOTAL, CKMB, CKMBINDEX, TROPONINI,  in the last 168 hours BNP: BNP (last 3 results) No results found for this basename: PROBNP,  in the last 8760 hours CBG: No results found for this basename: GLUCAP,  in the last 168  hours     Signed:  Esperanza Sheets  [facility] 11/11/2012, 2:20 PM

## 2012-11-12 LAB — WOUND CULTURE: Culture: NO GROWTH

## 2012-11-14 ENCOUNTER — Ambulatory Visit (HOSPITAL_COMMUNITY)
Admission: RE | Admit: 2012-11-14 | Discharge: 2012-11-14 | Disposition: A | Discharge status: Home / Self Care | Payer: Medicare Other | Source: Ambulatory Visit | Attending: Orthopedic Surgery | Admitting: Orthopedic Surgery

## 2012-11-14 ENCOUNTER — Encounter (HOSPITAL_COMMUNITY): Payer: Self-pay | Admitting: Orthopedic Surgery

## 2012-11-14 DIAGNOSIS — M25519 Pain in unspecified shoulder: Secondary | ICD-10-CM | POA: Insufficient documentation

## 2012-11-14 DIAGNOSIS — M25511 Pain in right shoulder: Secondary | ICD-10-CM

## 2012-11-14 DIAGNOSIS — M75100 Unspecified rotator cuff tear or rupture of unspecified shoulder, not specified as traumatic: Secondary | ICD-10-CM | POA: Insufficient documentation

## 2012-11-14 DIAGNOSIS — M7530 Calcific tendinitis of unspecified shoulder: Secondary | ICD-10-CM | POA: Insufficient documentation

## 2012-11-14 DIAGNOSIS — IMO0002 Reserved for concepts with insufficient information to code with codable children: Secondary | ICD-10-CM | POA: Insufficient documentation

## 2012-11-14 DIAGNOSIS — M249 Joint derangement, unspecified: Secondary | ICD-10-CM | POA: Insufficient documentation

## 2012-11-15 ENCOUNTER — Emergency Department (HOSPITAL_COMMUNITY)
Admission: EM | Admit: 2012-11-15 | Discharge: 2012-11-16 | Disposition: A | Discharge status: Home / Self Care | Payer: Medicare Other | Attending: Emergency Medicine | Admitting: Emergency Medicine

## 2012-11-15 ENCOUNTER — Encounter (HOSPITAL_COMMUNITY): Payer: Self-pay | Admitting: *Deleted

## 2012-11-15 DIAGNOSIS — Z791 Long term (current) use of non-steroidal anti-inflammatories (NSAID): Secondary | ICD-10-CM | POA: Insufficient documentation

## 2012-11-15 DIAGNOSIS — Z7982 Long term (current) use of aspirin: Secondary | ICD-10-CM | POA: Insufficient documentation

## 2012-11-15 DIAGNOSIS — J449 Chronic obstructive pulmonary disease, unspecified: Secondary | ICD-10-CM | POA: Insufficient documentation

## 2012-11-15 DIAGNOSIS — I2510 Atherosclerotic heart disease of native coronary artery without angina pectoris: Secondary | ICD-10-CM | POA: Insufficient documentation

## 2012-11-15 DIAGNOSIS — I10 Essential (primary) hypertension: Secondary | ICD-10-CM | POA: Insufficient documentation

## 2012-11-15 DIAGNOSIS — Z792 Long term (current) use of antibiotics: Secondary | ICD-10-CM | POA: Insufficient documentation

## 2012-11-15 DIAGNOSIS — Z85118 Personal history of other malignant neoplasm of bronchus and lung: Secondary | ICD-10-CM | POA: Insufficient documentation

## 2012-11-15 DIAGNOSIS — Z79899 Other long term (current) drug therapy: Secondary | ICD-10-CM | POA: Insufficient documentation

## 2012-11-15 DIAGNOSIS — Z8719 Personal history of other diseases of the digestive system: Secondary | ICD-10-CM | POA: Insufficient documentation

## 2012-11-15 DIAGNOSIS — Z8639 Personal history of other endocrine, nutritional and metabolic disease: Secondary | ICD-10-CM | POA: Insufficient documentation

## 2012-11-15 DIAGNOSIS — R21 Rash and other nonspecific skin eruption: Secondary | ICD-10-CM | POA: Insufficient documentation

## 2012-11-15 DIAGNOSIS — I8390 Asymptomatic varicose veins of unspecified lower extremity: Secondary | ICD-10-CM | POA: Insufficient documentation

## 2012-11-15 DIAGNOSIS — J4489 Other specified chronic obstructive pulmonary disease: Secondary | ICD-10-CM | POA: Insufficient documentation

## 2012-11-15 DIAGNOSIS — E119 Type 2 diabetes mellitus without complications: Secondary | ICD-10-CM | POA: Insufficient documentation

## 2012-11-15 DIAGNOSIS — Z862 Personal history of diseases of the blood and blood-forming organs and certain disorders involving the immune mechanism: Secondary | ICD-10-CM | POA: Insufficient documentation

## 2012-11-15 DIAGNOSIS — Z87891 Personal history of nicotine dependence: Secondary | ICD-10-CM | POA: Insufficient documentation

## 2012-11-15 DIAGNOSIS — M129 Arthropathy, unspecified: Secondary | ICD-10-CM | POA: Insufficient documentation

## 2012-11-15 DIAGNOSIS — Z87448 Personal history of other diseases of urinary system: Secondary | ICD-10-CM | POA: Insufficient documentation

## 2012-11-15 LAB — ANAEROBIC CULTURE

## 2012-11-15 NOTE — ED Notes (Signed)
Pt states he had a black spot on his left lower leg that had been there for a month, he scratched it tonight & blood began to shot of of it. Pt had wrapped w/ ace wrap & removed in treatment room. Bleeding has been stopped.

## 2012-11-15 NOTE — ED Notes (Signed)
Pt had a black spot on his left lower leg x 1 month. Pt states he scratched his leg and it spurted out blood. Leg is bandaged at this time.

## 2012-11-16 NOTE — ED Provider Notes (Signed)
CSN: [id number]     Arrival date & time 11/15/12  2227 History   First MD Initiated Contact with Patient 11/15/12 2356     Chief Complaint  Patient presents with  . leg bleeding    (Consider location/radiation/quality/duration/timing/severity/associated sxs/prior Treatment) Patient is a 79 y.o. male presenting with rash. The history is provided by the patient (the pt complains of bleeding from his left lower leg.  the bleeding has stopped now).  Rash Location: left lowe leg with small wound not bleeding now. Severity:  Mild Onset quality:  Sudden Timing:  Intermittent Progression:  Improving Chronicity:  New Context: not animal contact     Past Medical History  Diagnosis Date  . GERD (gastroesophageal reflux disease)   . Arthritis   . COPD (chronic obstructive pulmonary disease) 05/22/2008    FEV1/FVS 44.9  . Squamous cell carcinoma lung 2000    CT w/ CM; 04/25/2009 COPD, s/p RLL, CAD, 5 cm ascending aortic aneurysm  . Spontaneous pneumothorax   . BPH (benign prostatic hyperplasia)   . Hyperlipidemia   . Precordial pain   . Jaw pain   . CAD (coronary artery disease)     s/p Xience stent ot the distal RAC 2010, PCI with Xience stent to distal RCA at the [facility] 2011  . Hypertension   . Thoracic ascending aortic aneurysm     4.8 cm   . Diabetes mellitus     Well controlled  . Type II or unspecified type diabetes mellitus without mention of complication, not stated as uncontrolled    Past Surgical History  Procedure Laterality Date  . Lobectomy  2000    Right lower for squamous cell ca  . Needle removed from the right foot    . Lumbar laminectomy  1978  . Lymph node resected  1983    Axilla  . Spontaneous pneumothrax  1979  . Cardiac catheterization  2010    Stent  . Back surgery    . I&d extremity Left 11/10/2012    Procedure: IRRIGATION AND DEBRIDEMENT Left third-long finger ;  Surgeon: Jodi Marble, MD;  Location: [facility] OR;  Service: Orthopedics;  Laterality: Left;    Family History  Problem Relation Age of Onset  . Heart disease Neg Hx   . Diabetes Father   . Alcohol abuse Father    History  Substance Use Topics  . Smoking status: Former Smoker    Quit date: 02/16/1984  . Smokeless tobacco: Not on file     Comment: Smoked for 30 years  . Alcohol Use: Yes     Comment: wine with supper    Review of Systems  Skin: Positive for rash.    Allergies  Review of patient's allergies indicates no known allergies.  Home Medications   Current Outpatient Rx  Name  Route  Sig  Dispense  Refill  . amoxicillin-clavulanate (AUGMENTIN) 875-125 MG per tablet   Oral   Take 1 tablet by mouth 2 (two) times daily. One po bid x 7 days   14 tablet   0   . aspirin EC 81 MG tablet   Oral   Take 81 mg by mouth daily.         . clindamycin (CLEOCIN) 300 MG capsule   Oral   Take 1 capsule (300 mg total) by mouth 3 (three) times daily.   21 capsule   1   . fish oil-omega-3 fatty acids 1000 MG capsule   Oral   Take 3 g  by mouth daily.          Marland Kitchen glucosamine-chondroitin 500-400 MG tablet   Oral   Take 1 tablet by mouth 2 (two) times daily.          Marland Kitchen HYDROcodone-acetaminophen (NORCO) 5-325 MG per tablet   Oral   Take 1-2 tablets by mouth every 6 (six) hours as needed for pain.   40 tablet   0   . HYDROcodone-acetaminophen (NORCO/VICODIN) 5-325 MG per tablet   Oral   Take 1 tablet by mouth at bedtime.          Marland Kitchen losartan (COZAAR) 50 MG tablet   Oral   Take 50 mg by mouth daily.          . magnesium oxide (MAG-OX) 400 MG tablet   Oral   Take 400 mg by mouth daily.         . meloxicam (MOBIC) 15 MG tablet   Oral   Take 1 tablet (15 mg total) by mouth daily.   14 tablet   1   . Tamsulosin HCl (FLOMAX) 0.4 MG CAPS   Oral   Take 0.4 mg by mouth daily.          . vitamin B-12 (CYANOCOBALAMIN) 100 MCG tablet   Oral   Take 50 mcg by mouth daily.          BP 123/78  Pulse 90  Temp(Src) 98.1 F (36.7 C) (Oral)  Resp  16  Ht 5' 7.5" (1.715 m)  Wt 165 lb (74.844 kg)  BMI 25.45 kg/m2  SpO2 96% Physical Exam  Constitutional: He is oriented to person, place, and time. He appears well-developed.  HENT:  Head: Normocephalic.  Eyes: Conjunctivae are normal.  Neck: No tracheal deviation present.  Cardiovascular:  No murmur heard. Musculoskeletal: Normal range of motion.  Varicose vein.  Not bleeding now.  Left lower leg  Neurological: He is oriented to person, place, and time.  Skin: Skin is warm.  Psychiatric: He has a normal mood and affect.    ED Course  Procedures (including critical care time) Labs Review Labs Reviewed - No data to display Imaging Review Mr Shoulder Right Wo Contrast  11/14/2012   CLINICAL DATA:  Right shoulder pain. No injury.  EXAM: MRI OF THE RIGHT SHOULDER WITHOUT CONTRAST  TECHNIQUE: Multiplanar, multisequence MR imaging of the shoulder was performed. No intravenous contrast was administered.  COMPARISON:  Radiographs 11/04/2012.  FINDINGS: Rotator cuff: Supraspinatus tendinopathy is present with fraying of the bursal surface. Reactive bone marrow edema is present in the anterior greater tuberosity adjacent to the rotator cuff footprint. Tiny intrasubstance tear at the anterior insertion of supraspinatus. Infraspinatus tendon appears within normal limits. Teres minor tendon is normal. The subscapularis tendon appears normal and intact. Calcific tendonitis was identified on prior radiographs.  Muscles:  Normal. No atrophy or edema.  Biceps long head:  Intact.  Acromioclavicular Joint: Type 4 acromion is present with moderate AC joint osteoarthritis. AC joint effusion. Distal acromial undersurface spurring. Subacromial bursitis is present.  Glenohumeral Joint: Very mild glenohumeral osteoarthritis is present with small marginal osteophytes off the inferior humeral head.  Labrum:  Intact.  Bones:  No significant extra-articular osseous findings.  IMPRESSION: 1. Supraspinatus tendinopathy  and fraying. Tiny anterior insertional intrasubstance tear. Reactive bone marrow edema in the anterior greater tuberosity. 2. Moderate AC joint osteoarthritis and subacromial bursitis. 3. Calcific tendinitis of the rotator cuff better demonstrated on prior plain films.   Electronically Signed  By: Andreas Newport M.D.   On: 11/14/2012 11:48    MDM   1. Varicose vein        Benny Lennert, MD 11/16/12 [phone]

## 2012-11-16 NOTE — ED Notes (Signed)
Pt alert & oriented x4, stable gait. Patient given discharge instructions, paperwork & prescription(s). Patient  instructed to stop at the registration desk to finish any additional paperwork. Patient verbalized understanding. Pt left department w/ no further questions. 

## 2012-12-15 ENCOUNTER — Other Ambulatory Visit: Payer: Self-pay | Admitting: *Deleted

## 2012-12-15 DIAGNOSIS — I712 Thoracic aortic aneurysm, without rupture: Secondary | ICD-10-CM

## 2013-01-02 ENCOUNTER — Encounter: Payer: Self-pay | Admitting: Thoracic Surgery (Cardiothoracic Vascular Surgery)

## 2013-01-02 ENCOUNTER — Ambulatory Visit
Admission: RE | Admit: 2013-01-02 | Discharge: 2013-01-02 | Disposition: A | Discharge status: Home / Self Care | Payer: Medicare Other | Source: Ambulatory Visit | Attending: Thoracic Surgery (Cardiothoracic Vascular Surgery) | Admitting: Thoracic Surgery (Cardiothoracic Vascular Surgery)

## 2013-01-02 ENCOUNTER — Ambulatory Visit (INDEPENDENT_AMBULATORY_CARE_PROVIDER_SITE_OTHER): Payer: Medicare Other | Admitting: Thoracic Surgery (Cardiothoracic Vascular Surgery)

## 2013-01-02 VITALS — BP 122/73 | HR 83 | Resp 20 | Ht 67.5 in | Wt 165.0 lb

## 2013-01-02 DIAGNOSIS — I712 Thoracic aortic aneurysm, without rupture, unspecified: Secondary | ICD-10-CM

## 2013-01-02 MED ORDER — IOHEXOL 350 MG/ML SOLN
75.0000 mL | Freq: Once | INTRAVENOUS | Status: AC | PRN
  Administered 2013-01-02: 75 mL via INTRAVENOUS

## 2013-01-02 NOTE — Progress Notes (Signed)
HPI:  Brian Austin is an 79 yo man who I have been following for a 4.8 cm ascending aortic aneurysm since 2010, when it was discovered incidentally on a CT of the chest. We have been following him with annual CT scans since then.  Since his last visit Mr. [name] has been doing well from a cardiac standpoint. He has not been having any chest pain or shortness of breath to speak of. He is having some problems with his left knee. He had surgery on that about 8 years ago and likely will need an additional procedure setting. He is also having some difficulty with a right rotator cuff injury.  He denies chest pain, shortness of breath, PND, orthopnea, peripheral edema.  Past Medical History  Diagnosis Date  . GERD (gastroesophageal reflux disease)   . Arthritis   . COPD (chronic obstructive pulmonary disease) 05/22/2008    FEV1/FVS 44.9  . Squamous cell carcinoma lung 2000    CT w/ CM; 04/25/2009 COPD, s/p RLL, CAD, 5 cm ascending aortic aneurysm  . Spontaneous pneumothorax   . BPH (benign prostatic hyperplasia)   . Hyperlipidemia   . Precordial pain   . Jaw pain   . CAD (coronary artery disease)     s/p Xience stent ot the distal RAC 2010, PCI with Xience stent to distal RCA at the [facility] 2011  . Hypertension   . Thoracic ascending aortic aneurysm     4.8 cm   . Diabetes mellitus     Well controlled  . Type II or unspecified type diabetes mellitus without mention of complication, not stated as uncontrolled     Current Outpatient Prescriptions  Medication Sig Dispense Refill  . aspirin EC 81 MG tablet Take 81 mg by mouth daily.      . fish oil-omega-3 fatty acids 1000 MG capsule Take 3 g by mouth daily.       Marland Kitchen glucosamine-chondroitin 500-400 MG tablet Take 1 tablet by mouth 2 (two) times daily.       Marland Kitchen HYDROcodone-acetaminophen (NORCO) 5-325 MG per tablet Take 1-2 tablets by mouth every 6 (six) hours as needed for pain.  40 tablet  0  . losartan (COZAAR) 50 MG tablet Take 50 mg by  mouth daily.       . magnesium oxide (MAG-OX) 400 MG tablet Take 400 mg by mouth daily.      . Tamsulosin HCl (FLOMAX) 0.4 MG CAPS Take 0.4 mg by mouth daily.       . vitamin B-12 (CYANOCOBALAMIN) 100 MCG tablet Take 50 mcg by mouth daily.       No current facility-administered medications for this visit.    Physical Exam BP 122/73  Pulse 83  Resp 20  Ht 5' 7.5" (1.715 m)  Wt 165 lb (74.844 kg)  BMI 25.45 kg/m2  SpO87 75% 79 year old male in no acute distress Well-developed and well-nourished Neurologic alert and oriented x3 with no focal deficits Lungs diminished breath sounds right base otherwise clear Cardiac regular rate and rhythm normal S1 and S2 Abdomen no palpable pulsatile mass No peripheral edema  Diagnostic Tests: CT of chest 01/02/13 Stable ascending thoracic aortic aneurysm measuring 4.8 x 4.5 cm.  Impression: Brian Austin is an 79 year old gentleman with a stable 4.8 cm ascending thoracic aortic aneurysm. We have been following this for 4 years, and there's been no sign of change during that interval. He is doing well from a cardiac standpoint.   Plan: Return in one year with CT  angiogram of chest

## 2013-01-22 ENCOUNTER — Ambulatory Visit (HOSPITAL_COMMUNITY)
Admission: RE | Admit: 2013-01-22 | Discharge: 2013-01-22 | Disposition: A | Discharge status: Home / Self Care | Payer: Medicare Other | Source: Ambulatory Visit | Attending: Orthopedic Surgery | Admitting: Orthopedic Surgery

## 2013-01-22 DIAGNOSIS — M6281 Muscle weakness (generalized): Secondary | ICD-10-CM | POA: Insufficient documentation

## 2013-01-22 DIAGNOSIS — IMO0001 Reserved for inherently not codable concepts without codable children: Secondary | ICD-10-CM | POA: Insufficient documentation

## 2013-01-22 DIAGNOSIS — M25519 Pain in unspecified shoulder: Secondary | ICD-10-CM | POA: Insufficient documentation

## 2013-01-22 DIAGNOSIS — M25619 Stiffness of unspecified shoulder, not elsewhere classified: Secondary | ICD-10-CM | POA: Insufficient documentation

## 2013-01-22 DIAGNOSIS — I10 Essential (primary) hypertension: Secondary | ICD-10-CM | POA: Insufficient documentation

## 2013-01-22 DIAGNOSIS — M75111 Incomplete rotator cuff tear or rupture of right shoulder, not specified as traumatic: Secondary | ICD-10-CM

## 2013-01-22 DIAGNOSIS — M25511 Pain in right shoulder: Secondary | ICD-10-CM

## 2013-01-22 DIAGNOSIS — J449 Chronic obstructive pulmonary disease, unspecified: Secondary | ICD-10-CM | POA: Insufficient documentation

## 2013-01-22 DIAGNOSIS — J4489 Other specified chronic obstructive pulmonary disease: Secondary | ICD-10-CM | POA: Insufficient documentation

## 2013-01-22 NOTE — Evaluation (Signed)
Occupational Therapy Evaluation  Patient Details  Name: Brian Austin MRN: [id number] Date of Birth: Feb 10, 1931  Today's Date: 01/22/2013 Time: 1100-1125 OT Time Calculation (min): 25 min OT Eval 25' Visit#: 1 of 1  Re-eval:    Assessment Diagnosis: R Rotator Cuff Incomplete Tear Next MD Visit: unknown Prior Therapy: n/a   Past Medical History:  Past Medical History  Diagnosis Date  . GERD (gastroesophageal reflux disease)   . Arthritis   . COPD (chronic obstructive pulmonary disease) 05/22/2008    FEV1/FVS 44.9  . Squamous cell carcinoma lung 2000    CT w/ CM; 04/25/2009 COPD, s/p RLL, CAD, 5 cm ascending aortic aneurysm  . Spontaneous pneumothorax   . BPH (benign prostatic hyperplasia)   . Hyperlipidemia   . Precordial pain   . Jaw pain   . CAD (coronary artery disease)     s/p Xience stent ot the distal RAC 2010, PCI with Xience stent to distal RCA at [facility] 2011  . Hypertension   . Thoracic ascending aortic aneurysm     4.8 cm   . Diabetes mellitus     Well controlled  . Type II or unspecified type diabetes mellitus without mention of complication, not stated as uncontrolled    Past Surgical History:  Past Surgical History  Procedure Laterality Date  . Lobectomy  2000    Right lower for squamous cell ca  . Needle removed from the right foot    . Lumbar laminectomy  1978  . Lymph node resected  1983    Axilla  . Spontaneous pneumothrax  1979  . Cardiac catheterization  2010    Stent  . Back surgery    . I&d extremity Left 11/10/2012    Procedure: IRRIGATION AND DEBRIDEMENT Left third-long finger ;  Surgeon: Brian Marble, MD;  Location: [facility] OR;  Service: Orthopedics;  Laterality: Left;    Subjective S:  I can do everything I want to do, it just hurts. Pertinent History: At the end of September, Brian Austin fell off of his lawnmower.  A few weeks later, he was adjusting the deck height on the lawnmower, and aggrevated his right shoulder.  He  consulted with Brian Austin and recieved a cortisone injection, which did not alleviate his shoulder pain.  He has been referred to occupational therapy for evaluation and treatment.   Special Tests: FOT scored 100% Independent. Patient Stated Goals: Do everything I want to do without pain. Pain Assessment Currently in Pain?: Yes Pain Score: 5  Pain Location: Shoulder Pain Orientation: Right Pain Type: Acute pain Effect of Pain on Daily Activities: does not effect daily activities, occurs mostly at night.   Precautions/Restrictions  Precautions Precautions: None Restrictions Weight Bearing Restrictions: No  Balance Screening Balance Screen Has the patient fallen in the past 6 months: No Has the patient had a decrease in activity level because of a fear of falling? : No Is the patient reluctant to leave their home because of a fear of falling? : No  Prior Functioning  Prior Function Comments: lives alone with several animals, enjoys woodworking, is retired  Assessment ADL/Vision/Perception ADL ADL Comments: able to complete all daily activities, pain at night when trying to sleep Dominant Hand: Right  Cognition/Observation    Sensation/Coordination/Edema Coordination Gross Motor Movements are Fluid and Coordinated: Yes Fine Motor Movements are Fluid and Coordinated: Yes  Additional Assessments RUE AROM (degrees) RUE Overall AROM Comments: RUE AROM is WNL and equal to LUE RUE Strength RUE  Overall Strength Comments: RUE strength is 5/5 and is equal to LUE Palpation Palpation: trace fascial restrictions noted in right shoulder     Exercise/Treatments Standing External Rotation: Theraband;10 reps Theraband Level (Shoulder External Rotation): Level 3 (Green) Internal Rotation: Theraband;10 reps Theraband Level (Shoulder Internal Rotation): Level 3 (Green) Extension: Theraband;10 reps Theraband Level (Shoulder Extension): Level 3 (Green) Row: Theraband;15  reps Theraband Level (Shoulder Row): Level 3 (Green) Retraction: Theraband;10 reps Theraband Level (Shoulder Retraction): Level 3 (Green)         Occupational Therapy Assessment and Plan OT Assessment and Plan Clinical Impression Statement: A:  Patient referred for right shoulder, patient with WNL AROM and strength, primary deficit is pain, which is not limiting the use of his RUE and occurs at night time.  Completed one time eval, educated patient on HEP for scapular tband exercises and educated patient on use of heat and ice for pain control.   Pt will benefit from skilled therapeutic intervention in order to improve on the following deficits: Pain OT Frequency: Min 1X/week OT Duration:  (1 week) OT Treatment/Interventions: Patient/family education OT Plan: P:  DC from skilled OT intervention this date.  Educated on HEP for scapular stability and use of heat and ice for pain control.  Patient satisfied with HEP as course of treatment.    Goals Home Exercise Program Pt/caregiver will Perform Home Exercise Program: For increased strengthening;Independently PT Goal: Perform Home Exercise Program - Progress: Goal set today  Problem List Patient Active Problem List   Diagnosis Date Noted  . Nontraumatic incomplete tear of right rotator cuff 01/22/2013  . Pain in joint, shoulder region 01/22/2013  . Dog bite of hand 11/10/2012  . Cellulitis and abscess of hand 11/10/2012  . Hyperlipidemia   . Type II or unspecified type diabetes mellitus without mention of complication, not stated as uncontrolled   . Jaw pain   . CAD (coronary artery disease)   . ASCENDING AORTIC ANEURYSM 04/28/2009  . HEMOPTYSIS UNSPECIFIED 04/25/2009  . CHEST PAIN, ATYPICAL 04/25/2009  . LUNG CANCER, HX OF 04/25/2009  . CORONARY ARTERY DISEASE, S/P PTCA 07/02/2008  . MITRAL REGURGITATION 06/28/2008  . HYPERTENSION 06/28/2008  . COPD 06/28/2008  . CARDIOVASCULAR FUNCTION STUDY, ABNORMAL 06/19/2008  . AODM  06/13/2008  . HYPERLIPIDEMIA 06/13/2008  . JAW PAIN 06/13/2008  . PRECORDIAL PAIN 06/13/2008    End of Session Activity Tolerance: Patient tolerated treatment well General Behavior During Therapy: WFL for tasks assessed/performed OT Plan of Care OT Home Exercise Plan: Scapular tband with mod resist green tband, handout given and scanned.  GO Functional Limitation: Self care Self Care Current Status [phone]): 0 percent impaired, limited or restricted Self Care Goal Status (U0454): 0 percent impaired, limited or restricted Self Care Discharge Status [id number]): 0 percent impaired, limited or restricted  Shirlean Mylar, OTR/L  01/22/2013, 11:35 AM  Physician Documentation Your signature is required to indicate approval of the treatment plan as stated above.  Please sign and either send electronically or make a copy of this report for your files and return this physician signed original.  Please mark one 1.__approve of plan  2. ___approve of plan with the following conditions.   ______________________________  _____________________ Physician Signature                                                                                                             Date

## 2013-03-12 ENCOUNTER — Emergency Department (HOSPITAL_COMMUNITY): Payer: Medicare Other

## 2013-03-12 ENCOUNTER — Emergency Department (HOSPITAL_COMMUNITY)
Admission: EM | Admit: 2013-03-12 | Discharge: 2013-03-12 | Disposition: A | Discharge status: Home / Self Care | Payer: Medicare Other | Attending: Emergency Medicine | Admitting: Emergency Medicine

## 2013-03-12 ENCOUNTER — Encounter (HOSPITAL_COMMUNITY): Payer: Self-pay | Admitting: Emergency Medicine

## 2013-03-12 DIAGNOSIS — J441 Chronic obstructive pulmonary disease with (acute) exacerbation: Secondary | ICD-10-CM | POA: Insufficient documentation

## 2013-03-12 DIAGNOSIS — N400 Enlarged prostate without lower urinary tract symptoms: Secondary | ICD-10-CM | POA: Insufficient documentation

## 2013-03-12 DIAGNOSIS — R5383 Other fatigue: Secondary | ICD-10-CM

## 2013-03-12 DIAGNOSIS — Z87891 Personal history of nicotine dependence: Secondary | ICD-10-CM | POA: Insufficient documentation

## 2013-03-12 DIAGNOSIS — I493 Ventricular premature depolarization: Secondary | ICD-10-CM

## 2013-03-12 DIAGNOSIS — E119 Type 2 diabetes mellitus without complications: Secondary | ICD-10-CM | POA: Insufficient documentation

## 2013-03-12 DIAGNOSIS — Z8719 Personal history of other diseases of the digestive system: Secondary | ICD-10-CM | POA: Insufficient documentation

## 2013-03-12 DIAGNOSIS — Z79899 Other long term (current) drug therapy: Secondary | ICD-10-CM | POA: Insufficient documentation

## 2013-03-12 DIAGNOSIS — Z9889 Other specified postprocedural states: Secondary | ICD-10-CM | POA: Insufficient documentation

## 2013-03-12 DIAGNOSIS — I2510 Atherosclerotic heart disease of native coronary artery without angina pectoris: Secondary | ICD-10-CM | POA: Insufficient documentation

## 2013-03-12 DIAGNOSIS — R42 Dizziness and giddiness: Secondary | ICD-10-CM | POA: Insufficient documentation

## 2013-03-12 DIAGNOSIS — Z7982 Long term (current) use of aspirin: Secondary | ICD-10-CM | POA: Insufficient documentation

## 2013-03-12 DIAGNOSIS — R5381 Other malaise: Secondary | ICD-10-CM | POA: Insufficient documentation

## 2013-03-12 DIAGNOSIS — M129 Arthropathy, unspecified: Secondary | ICD-10-CM | POA: Insufficient documentation

## 2013-03-12 DIAGNOSIS — I4949 Other premature depolarization: Secondary | ICD-10-CM | POA: Insufficient documentation

## 2013-03-12 DIAGNOSIS — Z85118 Personal history of other malignant neoplasm of bronchus and lung: Secondary | ICD-10-CM | POA: Insufficient documentation

## 2013-03-12 DIAGNOSIS — I10 Essential (primary) hypertension: Secondary | ICD-10-CM | POA: Insufficient documentation

## 2013-03-12 DIAGNOSIS — R0789 Other chest pain: Secondary | ICD-10-CM | POA: Insufficient documentation

## 2013-03-12 LAB — CBC WITH DIFFERENTIAL/PLATELET
Basophils Absolute: 0 10*3/uL (ref 0.0–0.1)
Basophils Relative: 0 % (ref 0–1)
Eosinophils Absolute: 0.1 10*3/uL (ref 0.0–0.7)
Eosinophils Relative: 1 % (ref 0–5)
HCT: 44.6 % (ref 39.0–52.0)
Hemoglobin: 14.9 g/dL (ref 13.0–17.0)
Lymphocytes Relative: 27 % (ref 12–46)
Lymphs Abs: 2 10*3/uL (ref 0.7–4.0)
MCH: 30.1 pg (ref 26.0–34.0)
MCHC: 33.4 g/dL (ref 30.0–36.0)
MCV: 90.1 fL (ref 78.0–100.0)
Monocytes Absolute: 0.5 10*3/uL (ref 0.1–1.0)
Monocytes Relative: 7 % (ref 3–12)
Neutro Abs: 4.7 K/uL (ref 1.7–7.7)
Neutrophils Relative %: 65 % (ref 43–77)
Platelets: 145 10*3/uL — ABNORMAL LOW (ref 150–400)
RBC: 4.95 MIL/uL (ref 4.22–5.81)
RDW: 13.9 % (ref 11.5–15.5)
WBC: 7.2 K/uL (ref 4.0–10.5)

## 2013-03-12 LAB — TROPONIN I: Troponin I: <0.3 ng/mL (ref <0.30)

## 2013-03-12 LAB — COMPREHENSIVE METABOLIC PANEL
ALT: 25 U/L (ref 0–53)
AST: 25 U/L (ref 0–37)
BUN: 15 mg/dL (ref 6–23)
CO2: 24 mEq/L (ref 19–32)
Calcium: 9.6 mg/dL (ref 8.4–10.5)
Creatinine, Ser: 0.78 mg/dL (ref 0.50–1.35)
GFR calc Af Amer: >90 mL/min (ref >90)
GFR calc non Af Amer: 83 mL/min — ABNORMAL LOW (ref >90)
Glucose, Bld: 96 mg/dL (ref 70–99)
Sodium: 142 mEq/L (ref 137–147)
Total Bilirubin: 0.5 mg/dL (ref 0.3–1.2)
Total Protein: 7.7 g/dL (ref 6.0–8.3)

## 2013-03-12 LAB — COMPREHENSIVE METABOLIC PANEL WITH GFR
Albumin: 4.2 g/dL (ref 3.5–5.2)
Alkaline Phosphatase: 60 U/L (ref 39–117)
Chloride: 104 meq/L (ref 96–112)
Potassium: 4.7 meq/L (ref 3.7–5.3)

## 2013-03-12 MED ORDER — METOPROLOL TARTRATE 25 MG PO TABS
12.5000 mg | ORAL_TABLET | Freq: Once | ORAL | Status: AC
  Administered 2013-03-12: 12.5 mg via ORAL
  Filled 2013-03-12: qty 1

## 2013-03-12 MED ORDER — METOPROLOL SUCCINATE ER 25 MG PO TB24: 12.5000 mg | ORAL_TABLET | Freq: Every day | ORAL | Status: DC

## 2013-03-12 NOTE — ED Notes (Signed)
Pt transported to Xray. 

## 2013-03-12 NOTE — Discharge Instructions (Signed)
Premature Beats A premature beat is an extra heartbeat that happens earlier than normal. Premature beats are called premature atrial contractions (PACs) or premature ventricular contractions (PVCs) depending on the area of the heart where they start. CAUSES  Premature beats may be brought on by a variety of factors including:  Emotional stress.  Lack of sleep.  Caffeine.  Asthma medicines.  Stimulants.  Herbal teas.  Dietary supplements.  Alcohol. In most cases, premature beats are not dangerous and are not a sign of serious heart disease. Most patients evaluated for premature beats have completely normal heart function. Rarely, premature beats may be a sign of more significant heart problems or medical illness. SYMPTOMS  Premature beats may cause palpitations. This means you feel like your heart is skipping a beat or beating harder than usual. Sometimes, slight chest pain occurs with premature beats, lasting only a few seconds. This pain has been described as a "flopping" feeling inside the chest. In many cases, premature beats do not cause any symptoms and they are only detected when an electrocardiography test (EKG) or heart monitoring is performed. DIAGNOSIS  Your caregiver may run some tests to evaluate your heart such as an EKG or echocardiography. You may need to wear a portable heart monitor for several days to record the electrical activity of your heart. Blood testing may also be performed to check your electrolytes and thyroid function. TREATMENT  Premature beats usually go away with rest. If the problem continues, your caregiver will determine a treatment plan for you.  HOME CARE INSTRUCTIONS  Get plenty of rest over the next few days until your symptoms improve.  Avoid coffee, tea, alcohol, and soda (pop, cola).  Do not smoke. SEEK MEDICAL CARE IF:  Your symptoms continue after 1 to 2 days of rest.  You have new symptoms, such as chest pain or trouble  breathing. SEEK IMMEDIATE MEDICAL CARE IF:  You have severe chest pain or abdominal pain.  You have pain that radiates into the neck, arm, or jaw.  You faint or have extreme weakness.  You have shortness of breath.  Your heartbeat races for more than 5 seconds. MAKE SURE YOU:  Understand these instructions.  Will watch your condition.  Will get help right away if you are not doing well or get worse. Document Released: 03/11/2004 Document Revised: 04/26/2011 Document Reviewed: 10/05/2010 [facility] Patient Information 2014 [place], [place].

## 2013-03-12 NOTE — ED Provider Notes (Signed)
CSN: [id number]     Arrival date & time 03/12/13  1526 History   First MD Initiated Contact with Patient 03/12/13 1725     Chief Complaint  Patient presents with  . Palpitations  . Abnormal ECG   (Consider location/radiation/quality/duration/timing/severity/associated sxs/prior Treatment) HPI Comments: Pt has had cardiac stents in the past, but no stress test for past few years.  Pt admits to significant stress as his son's house was foreclosed suddenly and they've had to deal with financial and housing difficulties for his son.  Pt reports not sleeping well, change in appetite.  Gets very weak and dizzy, especially with exertion.  No sig CP.  Went to [place] clinic today and sent to the ED due to an abn ECG.  No sweats, nausea.  Some SOB.  No fever, cough.  No chest tightness, but has had indigestion pains intermittently that resolved with chewing up Tums.    Patient is a 80 y.o. male presenting with weakness. The history is provided by the patient.  Weakness This is a new problem. The current episode started more than 2 days ago. The problem occurs daily. The problem has not changed since onset.Associated symptoms include shortness of breath. Pertinent negatives include no chest pain and no abdominal pain. Associated symptoms comments: Dizziness and weakness. Nothing aggravates the symptoms. The symptoms are relieved by rest and lying down.    Past Medical History  Diagnosis Date  . GERD (gastroesophageal reflux disease)   . Arthritis   . COPD (chronic obstructive pulmonary disease) 05/22/2008    FEV1/FVS 44.9  . Squamous cell carcinoma lung 2000    CT w/ CM; 04/25/2009 COPD, s/p RLL, CAD, 5 cm ascending aortic aneurysm  . Spontaneous pneumothorax   . BPH (benign prostatic hyperplasia)   . Hyperlipidemia   . Precordial pain   . Jaw pain   . CAD (coronary artery disease)     s/p Xience stent ot the distal RAC 2010, PCI with Xience stent to distal RCA at [facility] 2011  .  Hypertension   . Thoracic ascending aortic aneurysm     4.8 cm   . Diabetes mellitus     Well controlled  . Type II or unspecified type diabetes mellitus without mention of complication, not stated as uncontrolled    Past Surgical History  Procedure Laterality Date  . Lobectomy  2000    Right lower for squamous cell ca  . Needle removed from the right foot    . Lumbar laminectomy  1978  . Lymph node resected  1983    Axilla  . Spontaneous pneumothrax  1979  . Cardiac catheterization  2010    Stent  . Back surgery    . I&d extremity Left 11/10/2012    Procedure: IRRIGATION AND DEBRIDEMENT Left third-long finger ;  Surgeon: Jolyn Nap, MD;  Location: [place];  Service: Orthopedics;  Laterality: Left;   Family History  Problem Relation Age of Onset  . Heart disease Neg Hx   . Diabetes Father   . Alcohol abuse Father    History  Substance Use Topics  . Smoking status: Former Smoker    Quit date: 02/16/1984  . Smokeless tobacco: Not on file     Comment: Smoked for 30 years  . Alcohol Use: Yes     Comment: wine with supper    Review of Systems  Constitutional: Positive for fatigue. Negative for fever.  Respiratory: Positive for chest tightness and shortness of breath. Negative for  cough.   Cardiovascular: Negative for chest pain.  Gastrointestinal: Negative for nausea, vomiting and abdominal pain.  Neurological: Positive for dizziness, weakness and light-headedness.  All other systems reviewed and are negative.    Allergies  Review of patient's allergies indicates no known allergies.  Home Medications   Current Outpatient Rx  Name  Route  Sig  Dispense  Refill  . aspirin EC 81 MG tablet   Oral   Take 81 mg by mouth daily.         . fish oil-omega-3 fatty acids 1000 MG capsule   Oral   Take 3 g by mouth daily.          Marland Kitchen glucosamine-chondroitin 500-400 MG tablet   Oral   Take 1 tablet by mouth 2 (two) times daily.          Marland Kitchen losartan (COZAAR) 50 MG  tablet   Oral   Take 50 mg by mouth daily.          . magnesium oxide (MAG-OX) 400 MG tablet   Oral   Take 400 mg by mouth daily.         . Tamsulosin HCl (FLOMAX) 0.4 MG CAPS   Oral   Take 0.4 mg by mouth daily.          . vitamin B-12 (CYANOCOBALAMIN) 100 MCG tablet   Oral   Take 50 mcg by mouth daily.         . metoprolol succinate (TOPROL-XL) 25 MG 24 hr tablet   Oral   Take 0.5 tablets (12.5 mg total) by mouth daily.   30 tablet   0    BP 152/100  Pulse 71  Temp(Src) 97.3 F (36.3 C) (Oral)  Resp 13  Ht 5\' 8"  (1.727 m)  Wt 168 lb 11.2 oz (76.522 kg)  BMI 25.66 kg/m2  SpO2 98% Physical Exam  Nursing note and vitals reviewed. Constitutional: He is oriented to person, place, and time. He appears well-developed and well-nourished. No distress.  HENT:  Head: Normocephalic and atraumatic.  Eyes: Conjunctivae and EOM are normal.  Neck: Neck supple.  Cardiovascular: Normal rate, regular rhythm and intact distal pulses.  Frequent extrasystoles are present.  Pulmonary/Chest: Effort normal.  Abdominal: Soft. He exhibits no distension. There is no tenderness. There is no rebound.  Musculoskeletal: He exhibits no edema and no tenderness.  Neurological: He is alert and oriented to person, place, and time. He exhibits normal muscle tone. Coordination normal.  Skin: Skin is warm and dry. He is not diaphoretic.  Psychiatric: He has a normal mood and affect.    ED Course  Procedures (including critical care time) Labs Review Labs Reviewed  CBC WITH DIFFERENTIAL - Abnormal; Notable for the following:    Platelets 145 (*)    All other components within normal limits  COMPREHENSIVE METABOLIC PANEL - Abnormal; Notable for the following:    GFR calc non Af Amer 83 (*)    All other components within normal limits  TROPONIN I   Imaging Review Dg Chest 2 View  03/12/2013   CLINICAL DATA:  Irregular fast heartbeat.  Difficulty breathing.  EXAM: CHEST  2 VIEW   COMPARISON:  Chest CT 01/02/2013.  FINDINGS: Status post right lower lobectomy with mild chronic pleuroparenchymal scarring in the base of the right hemithorax, similar to prior CT scan. No acute consolidative airspace disease. No pleural effusions. No evidence of pulmonary edema. Heart size is normal. Mild emphysematous changes are noted. No definite suspicious  appearing pulmonary nodule or mass. Atherosclerosis in the thoracic aorta.  IMPRESSION: 1. No radiographic evidence of acute cardiopulmonary disease. 2. Atherosclerosis. 3. Status post right lower lobectomy with chronic pleuroparenchymal scarring in the base of the right hemithorax. 4. Mild emphysematous changes.   Electronically Signed   By: Vinnie Langton M.D.   On: 03/12/2013 18:23    EKG Interpretation    Date/Time:  Monday March 12 2013 15:38:21 EST Ventricular Rate:  68 PR Interval:  174 QRS Duration: 132 QT Interval:  400 QTC Calculation: 425 R Axis:   -16 Text Interpretation:  Sinus rhythm with frequent Premature ventricular complexes Right bundle branch block , seen previously Abnormal ECG Confirmed by [facility]  MD, MICHEAL ([id number]) on 03/12/2013 5:53:19 PM           RA sat is 100% and I interpret to be normal  7:01 PM Spoke to Dr. Mare Ferrari.  Will start on low dose B-blocker and refer back to Dr. Percival Spanish. Informed patient who understands to start low dose toprol and to contact and follow up with Dr. Percival Spanish this week   MDM   1. Frequent PVCs      Pt with freq PVC's on monitor with associated compensatory pause following it.  With palpation of pulse, when several PVC's come in pattern of bigeminy, effective pulse is acting at rate in the 30s.  However, BP is normal, O2 sats are 100%, mentation is normal.    Will check troponin, electrolytes and discuss with [facility] cardiology.      Saddie Benders. Dorna Mai, MD 03/13/13 2101

## 2013-03-12 NOTE — ED Notes (Signed)
Pt seen at PCP, sent here to evaluate tachycardia. EKG shows RBBB. No chest pain, but reports dizziness and a lot of stress. Generalized weakness.

## 2013-03-18 ENCOUNTER — Encounter (HOSPITAL_COMMUNITY): Payer: Self-pay | Admitting: Emergency Medicine

## 2013-03-18 ENCOUNTER — Emergency Department (HOSPITAL_COMMUNITY)
Admission: EM | Admit: 2013-03-18 | Discharge: 2013-03-19 | Disposition: A | Discharge status: Home / Self Care | Payer: Medicare Other | Attending: Emergency Medicine | Admitting: Emergency Medicine

## 2013-03-18 DIAGNOSIS — E119 Type 2 diabetes mellitus without complications: Secondary | ICD-10-CM | POA: Insufficient documentation

## 2013-03-18 DIAGNOSIS — E785 Hyperlipidemia, unspecified: Secondary | ICD-10-CM | POA: Insufficient documentation

## 2013-03-18 DIAGNOSIS — Z79899 Other long term (current) drug therapy: Secondary | ICD-10-CM | POA: Insufficient documentation

## 2013-03-18 DIAGNOSIS — R0789 Other chest pain: Secondary | ICD-10-CM

## 2013-03-18 DIAGNOSIS — M129 Arthropathy, unspecified: Secondary | ICD-10-CM | POA: Insufficient documentation

## 2013-03-18 DIAGNOSIS — Z85118 Personal history of other malignant neoplasm of bronchus and lung: Secondary | ICD-10-CM | POA: Insufficient documentation

## 2013-03-18 DIAGNOSIS — J441 Chronic obstructive pulmonary disease with (acute) exacerbation: Secondary | ICD-10-CM | POA: Insufficient documentation

## 2013-03-18 DIAGNOSIS — I2510 Atherosclerotic heart disease of native coronary artery without angina pectoris: Secondary | ICD-10-CM | POA: Insufficient documentation

## 2013-03-18 DIAGNOSIS — I10 Essential (primary) hypertension: Secondary | ICD-10-CM | POA: Insufficient documentation

## 2013-03-18 DIAGNOSIS — Z9889 Other specified postprocedural states: Secondary | ICD-10-CM | POA: Insufficient documentation

## 2013-03-18 DIAGNOSIS — Z7982 Long term (current) use of aspirin: Secondary | ICD-10-CM | POA: Insufficient documentation

## 2013-03-18 DIAGNOSIS — Z87891 Personal history of nicotine dependence: Secondary | ICD-10-CM | POA: Insufficient documentation

## 2013-03-18 DIAGNOSIS — J9801 Acute bronchospasm: Secondary | ICD-10-CM | POA: Insufficient documentation

## 2013-03-18 DIAGNOSIS — N400 Enlarged prostate without lower urinary tract symptoms: Secondary | ICD-10-CM | POA: Insufficient documentation

## 2013-03-18 DIAGNOSIS — K219 Gastro-esophageal reflux disease without esophagitis: Secondary | ICD-10-CM | POA: Insufficient documentation

## 2013-03-18 DIAGNOSIS — R110 Nausea: Secondary | ICD-10-CM | POA: Insufficient documentation

## 2013-03-18 LAB — CBC
HEMATOCRIT: 45 % (ref 39.0–52.0)
Hemoglobin: 15 g/dL (ref 13.0–17.0)
MCH: 30.2 pg (ref 26.0–34.0)
MCHC: 33.3 g/dL (ref 30.0–36.0)
MCV: 90.7 fL (ref 78.0–100.0)
Platelets: 151 10*3/uL (ref 150–400)
RBC: 4.96 MIL/uL (ref 4.22–5.81)
RDW: 13.9 % (ref 11.5–15.5)
WBC: 7.5 10*3/uL (ref 4.0–10.5)

## 2013-03-18 LAB — BASIC METABOLIC PANEL
BUN: 16 mg/dL (ref 6–23)
CALCIUM: 9.4 mg/dL (ref 8.4–10.5)
CO2: 27 mEq/L (ref 19–32)
Chloride: 103 mEq/L (ref 96–112)
Creatinine, Ser: 0.79 mg/dL (ref 0.50–1.35)
GFR, EST NON AFRICAN AMERICAN: 83 mL/min — AB (ref >90)
Glucose, Bld: 150 mg/dL — ABNORMAL HIGH (ref 70–99)
Potassium: 4.1 mEq/L (ref 3.7–5.3)
Sodium: 141 mEq/L (ref 137–147)

## 2013-03-18 LAB — TROPONIN I: Troponin I: <0.3 ng/mL (ref <0.30)

## 2013-03-18 LAB — PRO B NATRIURETIC PEPTIDE: Pro B Natriuretic peptide (BNP): 358.5 pg/mL (ref 0–450)

## 2013-03-18 MED ORDER — IPRATROPIUM BROMIDE 0.02 % IN SOLN
0.5000 mg | Freq: Once | RESPIRATORY_TRACT | Status: AC
  Administered 2013-03-19: 0.5 mg via RESPIRATORY_TRACT
  Filled 2013-03-18: qty 2.5

## 2013-03-18 MED ORDER — ALBUTEROL SULFATE (2.5 MG/3ML) 0.083% IN NEBU
5.0000 mg | INHALATION_SOLUTION | Freq: Once | RESPIRATORY_TRACT | Status: AC
  Administered 2013-03-19: 5 mg via RESPIRATORY_TRACT
  Filled 2013-03-18: qty 6

## 2013-03-18 MED ORDER — NITROGLYCERIN 2 % TD OINT
1.0000 [in_us] | TOPICAL_OINTMENT | Freq: Once | TRANSDERMAL | Status: AC
  Administered 2013-03-18: 1 [in_us] via TOPICAL
  Filled 2013-03-18: qty 1

## 2013-03-18 NOTE — ED Notes (Signed)
Called RT for treatment, pt is standing in room, pulling wires on, does not want to be on the monitor.

## 2013-03-18 NOTE — ED Notes (Addendum)
Pt c/o lower back pain and then it went away. Pt then said the right side of his chest started hurting. Pt reports nausea. Pt states he has been under extreme stress and anxiety the last several weeks due to son losing his home.

## 2013-03-18 NOTE — ED Provider Notes (Signed)
CSN: [id number]     Arrival date & time 03/18/13  2007 History  This chart was scribed for Janice Norrie, MD by Maree Erie, ED Scribe. The patient was seen in room APA01/APA01. Patient's care was started at 8:24PM.      Chief Complaint  Patient presents with  . Chest Pain   The history is provided by the patient. No language interpreter was used.    HPI Comments: Brian Austin is a 80 y.o. male who presents to the Emergency Department complaining of an episode of right sided chest pain that began an hour ago when he was walking his dog. He states this is his usual routine and he had been walking for ten minutes prior to the pain coming on. The pain lasted about ten minutes. He reports associated nausea with the episode. He denies diaphoresis, above usual shortness of breath or vomiting with the pain. He denies similar pain in the past. He has a past cardiac history of an aortic aneurysm. He states that they are checking the aortic aneurysm once a year. It was last checked in November and states that it was unchanged at 48 mm since 2004. He was seen in the ED 1/26 for palpitations.   He has also been having worsening shortness of breath for the past 2-3 weeks. He also has a productive cough with white sputum and clear, watery rhinorrhea that began about three weeks ago. He denies fever or sore throat. He states he has had intermittent diarrhea for the past three days. He states that he has had bilateral flank pain that resolved after a few minutes prior to the chest pain. He states that he has baseline wheezing but denies using a nebulizer or inhaler at home for it. He denies smoking. He drinks a glass of red wine with dinner but has not drank in the last five days. He lives alone.  PT just seen in the ED on the 26th for palpitations.   Cardiologist is Dr. Percival Spanish. He goes to the [facility] in [place] for his primary care.    Past Medical History  Diagnosis Date  . GERD (gastroesophageal reflux  disease)   . Arthritis   . COPD (chronic obstructive pulmonary disease) 05/22/2008    FEV1/FVS 44.9  . Squamous cell carcinoma lung 2000    CT w/ CM; 04/25/2009 COPD, s/p RLL, CAD, 5 cm ascending aortic aneurysm  . Spontaneous pneumothorax   . BPH (benign prostatic hyperplasia)   . Hyperlipidemia   . Precordial pain   . Jaw pain   . CAD (coronary artery disease)     s/p Xience stent ot the distal RAC 2010, PCI with Xience stent to distal RCA at the [facility] 2011  . Hypertension   . Thoracic ascending aortic aneurysm     4.8 cm   . Diabetes mellitus     Well controlled  . Type II or unspecified type diabetes mellitus without mention of complication, not stated as uncontrolled    Past Surgical History  Procedure Laterality Date  . Lobectomy  2000    Right lower for squamous cell ca  . Needle removed from the right foot    . Lumbar laminectomy  1978  . Lymph node resected  1983    Axilla  . Spontaneous pneumothrax  1979  . Cardiac catheterization  2010    Stent  . Back surgery    . I&d extremity Left 11/10/2012    Procedure: IRRIGATION AND DEBRIDEMENT Left third-long  finger ;  Surgeon: Jolyn Nap, MD;  Location: [facility];  Service: Orthopedics;  Laterality: Left;   Family History  Problem Relation Age of Onset  . Heart disease Neg Hx   . Diabetes Father   . Alcohol abuse Father      History  Substance Use Topics  . Smoking status: Former Smoker    Quit date: 02/16/1984  . Smokeless tobacco: Not on file     Comment: Smoked for 30 years  . Alcohol Use: Yes     Comment: wine with supper  lives at home Lives alone with many pets  Review of Systems  Constitutional: Negative for fever and diaphoresis.  HENT: Positive for rhinorrhea. Negative for sore throat.   Respiratory: Positive for cough, shortness of breath and wheezing.   Cardiovascular: Positive for chest pain.  Gastrointestinal: Positive for nausea and diarrhea. Negative for vomiting.  All other systems  reviewed and are negative.    Allergies  Review of patient's allergies indicates no known allergies.  Home Medications   Current Outpatient Rx  Name  Route  Sig  Dispense  Refill  . aspirin EC 81 MG tablet   Oral   Take 81 mg by mouth daily.         . fish oil-omega-3 fatty acids 1000 MG capsule   Oral   Take 3 g by mouth daily.          Marland Kitchen glucosamine-chondroitin 500-400 MG tablet   Oral   Take 1 tablet by mouth 2 (two) times daily.          Marland Kitchen losartan (COZAAR) 50 MG tablet   Oral   Take 50 mg by mouth daily.          . magnesium oxide (MAG-OX) 400 MG tablet   Oral   Take 400 mg by mouth daily.         . metoprolol succinate (TOPROL-XL) 25 MG 24 hr tablet   Oral   Take 0.5 tablets (12.5 mg total) by mouth daily.   30 tablet   0   . Tamsulosin HCl (FLOMAX) 0.4 MG CAPS   Oral   Take 0.4 mg by mouth daily.          . vitamin B-12 (CYANOCOBALAMIN) 100 MCG tablet   Oral   Take 50 mcg by mouth daily.           ED Triage Vitals  Enc Vitals Group     BP 03/18/13 2046 123/68 mmHg     Pulse Rate 03/18/13 2046 79     Resp 03/18/13 2046 13     Temp --      Temp src --      SpO2 03/18/13 2046 94 %     Weight --      Height --      Head Cir --      Peak Flow --      Pain Score 03/18/13 2015 5     Pain Loc --      Pain Edu? --      Excl. in [place]? --    Vital signs normal     Physical Exam  Nursing note and vitals reviewed. Constitutional: He is oriented to person, place, and time. He appears well-developed and well-nourished.  Non-toxic appearance. He does not appear ill. No distress.  HENT:  Head: Normocephalic and atraumatic.  Right Ear: External ear normal.  Left Ear: External ear normal.  Nose: Nose normal. No  mucosal edema or rhinorrhea.  Mouth/Throat: Oropharynx is clear and moist and mucous membranes are normal. No dental abscesses or uvula swelling.  Eyes: Conjunctivae and EOM are normal. Pupils are equal, round, and reactive to light.   Neck: Normal range of motion and full passive range of motion without pain. Neck supple.  Cardiovascular: Normal rate, regular rhythm and normal heart sounds.  Exam reveals no gallop and no friction rub.   No murmur heard. Pulmonary/Chest: Effort normal. No respiratory distress. He has wheezes. He has no rhonchi. He has no rales. He exhibits no tenderness and no crepitus.  Diffuse scattered wheezes.   Abdominal: Soft. Normal appearance and bowel sounds are normal. He exhibits no distension. There is no tenderness. There is no rebound and no guarding.  Musculoskeletal: Normal range of motion. He exhibits no edema and no tenderness.  Moves all extremities well.   Neurological: He is alert and oriented to person, place, and time. He has normal strength. No cranial nerve deficit.  Skin: Skin is warm, dry and intact. No rash noted. No erythema. No pallor.  Psychiatric: His speech is normal and behavior is normal. His mood appears not anxious.  Flat affect    ED Course  Procedures (including critical care time)  Medications  nitroGLYCERIN (NITROGLYN) 2 % ointment 1 inch (1 inch Topical Given 03/18/13 2045)  albuterol (PROVENTIL) (2.5 MG/3ML) 0.083% nebulizer solution 5 mg (5 mg Nebulization Given 03/19/13 0008)  ipratropium (ATROVENT) nebulizer solution 0.5 mg (0.5 mg Nebulization Given 03/19/13 0009)     DIAGNOSTIC STUDIES: Oxygen Saturation is 94% on room air, adequate by my interpretation.    COORDINATION OF CARE: 12:34 AM - Patient verbalizes understanding and agrees with treatment plan. He does not want to get a nebulizer treatment at this time.   Pt refused nebulizer when he was first seen. After his second troponin came back negative he agreed to a nebulizer. On recheck he had improved air movement and scattered rhonchi, doesn't want another nebulizer or inhaler. States he uses mentholated cough drops and his breathing feels better, but the inhaler doesn't help.   Labs Review Results  for orders placed during the hospital encounter of 03/18/13  CBC      Result Value Range   WBC 7.5  4.0 - 10.5 K/uL   RBC 4.96  4.22 - 5.81 MIL/uL   Hemoglobin 15.0  13.0 - 17.0 g/dL   HCT 45.0  39.0 - 52.0 %   MCV 90.7  78.0 - 100.0 fL   MCH 30.2  26.0 - 34.0 pg   MCHC 33.3  30.0 - 36.0 g/dL   RDW 13.9  11.5 - 15.5 %   Platelets 151  150 - 400 K/uL  BASIC METABOLIC PANEL      Result Value Range   Sodium 141  137 - 147 mEq/L   Potassium 4.1  3.7 - 5.3 mEq/L   Chloride 103  96 - 112 mEq/L   CO2 27  19 - 32 mEq/L   Glucose, Bld 150 (*) 70 - 99 mg/dL   BUN 16  6 - 23 mg/dL   Creatinine, Ser 0.79  0.50 - 1.35 mg/dL   Calcium 9.4  8.4 - 10.5 mg/dL   GFR calc non Af Amer 83 (*) >90 mL/min   GFR calc Af Amer >90  >90 mL/min  PRO B NATRIURETIC PEPTIDE      Result Value Range   Pro B Natriuretic peptide (BNP) 358.5  0 - 450 pg/mL  TROPONIN I      Result Value Range   Troponin I <0.30  <0.30 ng/mL  TROPONIN I      Result Value Range   Troponin I <0.30  <0.30 ng/mL   Laboratory interpretation all normal    Imaging Review No results found.   Dg Chest 2 View  03/12/2013   CLINICAL DATA:  Irregular fast heartbeat.  Difficulty breathing.  IMPRESSION: 1. No radiographic evidence of acute cardiopulmonary disease. 2. Atherosclerosis. 3. Status post right lower lobectomy with chronic pleuroparenchymal scarring in the base of the right hemithorax. 4. Mild emphysematous changes.   Electronically Signed   By: Vinnie Langton M.D.   On: 03/12/2013 18:23     EKG Interpretation    Date/Time:  Sunday March 18 2013 20:14:57 EST Ventricular Rate:  93 PR Interval:  134 QRS Duration: 138 QT Interval:  364 QTC Calculation: 452 R Axis:   161 Text Interpretation:  Sinus rhythm with frequent Premature ventricular complexes Right bundle branch block Left posterior fascicular blockBifascicular block  When compared with ECG of 12-Mar-2013 15:38, Left posterior fascicular block is now Present  less PVC's present Confirmed by Ashrith Sagan  MD-I, Jari Dipasquale ([id number]) on 03/18/2013 9:11:11 PM              MDM   1. Atypical chest pain   2. Bronchospasm     Plan discharge  Rolland Porter, MD, FACEP   I personally performed the services described in this documentation, which was scribed in my presence. The recorded information has been reviewed and considered.  Rolland Porter, MD, FACEP    Janice Norrie, MD 03/19/13 [phone]

## 2013-03-19 NOTE — ED Notes (Signed)
Getting breathing treatment

## 2013-03-19 NOTE — ED Notes (Signed)
Breathing treatment complete, pt at the nursing station ready to leave

## 2013-03-19 NOTE — ED Notes (Signed)
Patient given discharge instruction, verbalized understand. IV removed, band aid applied. Patient ambulatory out of the department.  

## 2013-03-19 NOTE — Discharge Instructions (Signed)
Your test today do not show evidence of a heart attack. You are having wheezing which can make your chest feel tight. Let Dr Hochrein's office know about your ED visit tonight. He may want to see you in the office. Return if you get worse.

## 2013-07-13 ENCOUNTER — Encounter: Payer: Self-pay | Admitting: Cardiology

## 2013-07-30 ENCOUNTER — Ambulatory Visit: Payer: Medicare Other | Admitting: Cardiology

## 2013-08-07 ENCOUNTER — Other Ambulatory Visit: Payer: Self-pay | Admitting: Cardiology

## 2013-09-10 ENCOUNTER — Encounter: Payer: Self-pay | Admitting: Cardiology

## 2013-09-10 ENCOUNTER — Ambulatory Visit (INDEPENDENT_AMBULATORY_CARE_PROVIDER_SITE_OTHER): Payer: Medicare Other | Admitting: Cardiology

## 2013-09-10 VITALS — BP 126/66 | HR 74 | Ht 68.0 in | Wt 166.0 lb

## 2013-09-10 DIAGNOSIS — I259 Chronic ischemic heart disease, unspecified: Secondary | ICD-10-CM

## 2013-09-10 DIAGNOSIS — I712 Thoracic aortic aneurysm, without rupture, unspecified: Secondary | ICD-10-CM

## 2013-09-10 DIAGNOSIS — I10 Essential (primary) hypertension: Secondary | ICD-10-CM

## 2013-09-10 DIAGNOSIS — I080 Rheumatic disorders of both mitral and aortic valves: Secondary | ICD-10-CM

## 2013-09-10 DIAGNOSIS — I2510 Atherosclerotic heart disease of native coronary artery without angina pectoris: Secondary | ICD-10-CM

## 2013-09-10 DIAGNOSIS — I2583 Coronary atherosclerosis due to lipid rich plaque: Secondary | ICD-10-CM

## 2013-09-10 MED ORDER — LOSARTAN POTASSIUM 25 MG PO TABS: 25.0000 mg | ORAL_TABLET | Freq: Every day | ORAL | Status: DC

## 2013-09-10 NOTE — Progress Notes (Signed)
HPI The patient presents for followup of his known coronary disease. Since I last saw him he was in the ED twice earlier this year.  He had chest pain that was atypical.  I reviewed these results.  He thinks that it was related to stress that he has been going through.  He has not had any of this recently.  He does say that he has been fatigued and thinks that this is related to his Cozaar.  He thinks that his BP has come down too low. He denies any chest discomfort, jaw discomfort  or excessive shortness of breath. He denies any palpitations, presyncope or syncope. He has no PND or orthopnea.   No Known Allergies  Current Outpatient Prescriptions  Medication Sig Dispense Refill  . aspirin EC 81 MG tablet Take 81 mg by mouth daily.      . fish oil-omega-3 fatty acids 1000 MG capsule Take 3 g by mouth daily.       Marland Kitchen glucosamine-chondroitin 500-400 MG tablet Take 1 tablet by mouth 2 (two) times daily.       Marland Kitchen losartan (COZAAR) 50 MG tablet Take 50 mg by mouth daily.       . magnesium oxide (MAG-OX) 400 MG tablet Take 400 mg by mouth daily.      . metoprolol succinate (TOPROL-XL) 25 MG 24 hr tablet Take 0.5 tablets (12.5 mg total) by mouth daily.  30 tablet  0  . pravastatin (PRAVACHOL) 40 MG tablet TAKE 1 TABLET DAILY  90 tablet  0  . Tamsulosin HCl (FLOMAX) 0.4 MG CAPS Take 0.4 mg by mouth daily.       . vitamin B-12 (CYANOCOBALAMIN) 100 MCG tablet Take 50 mcg by mouth daily.       No current facility-administered medications for this visit.    Past Medical History  Diagnosis Date  . GERD (gastroesophageal reflux disease)   . Arthritis   . COPD (chronic obstructive pulmonary disease) 05/22/2008    FEV1/FVS 44.9  . Squamous cell carcinoma lung 2000    CT w/ CM; 04/25/2009 COPD, s/p RLL, CAD, 5 cm ascending aortic aneurysm  . Spontaneous pneumothorax   . BPH (benign prostatic hyperplasia)   . Hyperlipidemia   . Precordial pain   . Jaw pain   . CAD (coronary artery disease)     s/p  Xience stent ot the distal RAC 2010, PCI with Xience stent to distal RCA at the [facility] 2011  . Hypertension   . Thoracic ascending aortic aneurysm     4.8 cm   . Diabetes mellitus     Well controlled  . Type II or unspecified type diabetes mellitus without mention of complication, not stated as uncontrolled     Past Surgical History  Procedure Laterality Date  . Lobectomy  2000    Right lower for squamous cell ca  . Needle removed from the right foot    . Lumbar laminectomy  1978  . Lymph node resected  1983    Axilla  . Spontaneous pneumothrax  1979  . Cardiac catheterization  2010    Stent  . Back surgery    . I&d extremity Left 11/10/2012    Procedure: IRRIGATION AND DEBRIDEMENT Left third-long finger ;  Surgeon: Jolyn Nap, MD;  Location: [facility];  Service: Orthopedics;  Laterality: Left;    ROS:  As stated in the HPI and negative for all other systems.  PHYSICAL EXAM BP 126/66  Pulse 74  Ht  5\' 8"  (1.727 m)  Wt 166 lb (75.297 kg)  BMI 25.25 kg/m2 GENERAL:  Well appearing HEENT:  Pupils equal round and reactive, fundi not visualized, oral mucosa unremarkable, dentures NECK:  No jugular venous distention, waveform within normal limits, carotid upstroke brisk and symmetric, no bruits, no thyromegaly LYMPHATICS:  No cervical, inguinal adenopathy LUNGS:  Scattered bilateral expiratory wheezes BACK:  No CVA tenderness CHEST:  Unremarkable HEART:  PMI not displaced or sustained,S1 and S2 within normal limits, no S3, no S4, no clicks, no rubs, no murmurs ABD:  Flat, positive bowel sounds normal in frequency in pitch, no bruits, no rebound, no guarding, no midline pulsatile mass, no hepatomegaly, no splenomegaly EXT:  2 plus pulses throughout, no edema, no cyanosis no clubbing   EKG: Sinus rhythm, rate, axis within normal limits 74, RBBB, no acute ST-T wave changes.  09/10/2013   ASSESSMENT AND PLAN  CAD:  The patient has no new sypmtoms.  No further cardiovascular  testing is indicated.  We will continue with aggressive risk reduction and meds as listed.  HTN:  He wanted to come off of his Cozaar because of fatigue.  I have convinced him to take 25 mg.  He will let me know how he feels.    HYPERLIPIDEMIA:  Lipid profile was excellent I reviewed this. No change in therapy is indicated.  BRUIT:   I will order carotid Doppler  AORTIC ANEURYSM:  This is stable at 4.8 cm X 4.5 in Oct 2014. This is followed by Dr. Roxan Hockey.

## 2013-09-10 NOTE — Patient Instructions (Signed)
Your physician recommends that you schedule a follow-up appointment in: one year with Dr. Hochrein  

## 2013-09-18 ENCOUNTER — Ambulatory Visit (HOSPITAL_COMMUNITY)
Admission: RE | Admit: 2013-09-18 | Discharge: 2013-09-18 | Disposition: A | Discharge status: Home / Self Care | Payer: Medicare Other | Source: Ambulatory Visit | Attending: Internal Medicine | Admitting: Internal Medicine

## 2013-09-18 DIAGNOSIS — I712 Thoracic aortic aneurysm, without rupture, unspecified: Secondary | ICD-10-CM | POA: Diagnosis not present

## 2013-09-18 DIAGNOSIS — I2510 Atherosclerotic heart disease of native coronary artery without angina pectoris: Secondary | ICD-10-CM | POA: Insufficient documentation

## 2013-09-18 DIAGNOSIS — I080 Rheumatic disorders of both mitral and aortic valves: Secondary | ICD-10-CM | POA: Diagnosis not present

## 2013-09-18 DIAGNOSIS — I2583 Coronary atherosclerosis due to lipid rich plaque: Secondary | ICD-10-CM | POA: Diagnosis not present

## 2013-09-18 DIAGNOSIS — I259 Chronic ischemic heart disease, unspecified: Secondary | ICD-10-CM | POA: Insufficient documentation

## 2013-09-18 DIAGNOSIS — I10 Essential (primary) hypertension: Secondary | ICD-10-CM | POA: Insufficient documentation

## 2013-09-18 DIAGNOSIS — R0989 Other specified symptoms and signs involving the circulatory and respiratory systems: Secondary | ICD-10-CM

## 2013-09-18 NOTE — Progress Notes (Signed)
Carotid Duplex Completed. Shery Wauneka, BS, RDMS, RVT  

## 2013-10-19 ENCOUNTER — Other Ambulatory Visit: Payer: Self-pay | Admitting: Cardiology

## 2013-10-27 ENCOUNTER — Ambulatory Visit: Payer: Self-pay | Admitting: Neurology

## 2013-12-04 ENCOUNTER — Other Ambulatory Visit: Payer: Self-pay | Admitting: *Deleted

## 2013-12-04 DIAGNOSIS — I7121 Aneurysm of the ascending aorta, without rupture: Secondary | ICD-10-CM

## 2013-12-04 DIAGNOSIS — I712 Thoracic aortic aneurysm, without rupture: Secondary | ICD-10-CM

## 2013-12-21 ENCOUNTER — Other Ambulatory Visit: Payer: Self-pay | Admitting: *Deleted

## 2013-12-29 LAB — CREATININE, SERUM: CREATININE: 0.77 mg/dL (ref 0.50–1.35)

## 2013-12-29 LAB — BUN: BUN: 14 mg/dL (ref 6–23)

## 2014-01-01 ENCOUNTER — Ambulatory Visit
Admission: RE | Admit: 2014-01-01 | Discharge: 2014-01-01 | Disposition: A | Discharge status: Home / Self Care | Payer: Medicare Other | Source: Ambulatory Visit | Attending: Thoracic Surgery (Cardiothoracic Vascular Surgery) | Admitting: Thoracic Surgery (Cardiothoracic Vascular Surgery)

## 2014-01-01 ENCOUNTER — Ambulatory Visit (INDEPENDENT_AMBULATORY_CARE_PROVIDER_SITE_OTHER): Payer: Medicare Other | Admitting: Thoracic Surgery (Cardiothoracic Vascular Surgery)

## 2014-01-01 ENCOUNTER — Encounter: Payer: Self-pay | Admitting: Thoracic Surgery (Cardiothoracic Vascular Surgery)

## 2014-01-01 VITALS — BP 147/74 | HR 60 | Resp 20 | Ht 68.0 in | Wt 157.0 lb

## 2014-01-01 DIAGNOSIS — I7121 Aneurysm of the ascending aorta, without rupture: Secondary | ICD-10-CM

## 2014-01-01 DIAGNOSIS — I712 Thoracic aortic aneurysm, without rupture: Secondary | ICD-10-CM

## 2014-01-01 DIAGNOSIS — I259 Chronic ischemic heart disease, unspecified: Secondary | ICD-10-CM

## 2014-01-01 MED ORDER — IOHEXOL 350 MG/ML SOLN
80.0000 mL | Freq: Once | INTRAVENOUS | Status: AC | PRN
Start: 2014-01-01 — End: 2014-01-01

## 2014-01-01 NOTE — Progress Notes (Signed)
HPI:  Brian Austin is an 80 year old man presents for a 1 year follow-up of an ascending aneurysm.  We have been following his aneurysm since it was first noted in 2010 incidentally on a CT of the chest. We have been following him with annual CT scans since then.  Since his last visit Brian Austin has been doing well from a cardiac standpoint. He has not been having any chest pain or shortness of breath to speak of. He saw Dr. Percival Spanish a couple of months ago. At that time his blood pressure was dropping too low. He wanted to stop his Cozaar altogether, but Dr. Percival Spanish and was able to convince him to drop his dose to 25 mg daily. Since then Brian Austin feels that his blood pressure has been better.   He denies shortness of breath, PND, orthopnea, peripheral edema. He has been having some "gas pains" in his chest. He says that this is relieved with Tums and is not associated with exertion.  Past Medical History  Diagnosis Date  . GERD (gastroesophageal reflux disease)   . Arthritis   . COPD (chronic obstructive pulmonary disease) 05/22/2008    FEV1/FVS 44.9  . Squamous cell carcinoma lung 2000    CT w/ CM; 04/25/2009 COPD, s/p RLL, CAD, 5 cm ascending aortic aneurysm  . Spontaneous pneumothorax   . BPH (benign prostatic hyperplasia)   . Hyperlipidemia   . Precordial pain   . Jaw pain   . CAD (coronary artery disease)     s/p Xience stent ot the distal RAC 2010, PCI with Xience stent to distal RCA at the [facility] 2011  . Hypertension   . Thoracic ascending aortic aneurysm     4.8 cm   . Diabetes mellitus     Well controlled  . Type II or unspecified type diabetes mellitus without mention of complication, not stated as uncontrolled       Current Outpatient Prescriptions  Medication Sig Dispense Refill  . Cholecalciferol ([employer] VIT D 5000 HIGH-POTENCY PO) Take by mouth daily.    Marland Kitchen glucosamine-chondroitin 500-400 MG tablet Take 1 tablet by mouth 2 (two) times daily.     Marland Kitchen losartan  (COZAAR) 25 MG tablet Take 1 tablet (25 mg total) by mouth daily. 90 tablet 3  . magnesium oxide (MAG-OX) 400 MG tablet Take 400 mg by mouth daily.    . Tamsulosin HCl (FLOMAX) 0.4 MG CAPS Take 0.4 mg by mouth daily.     . vitamin B-12 (CYANOCOBALAMIN) 100 MCG tablet Take 50 mcg by mouth daily.     No current facility-administered medications for this visit.   Facility-Administered Medications Ordered in Other Visits  Medication Dose Route Frequency Provider Last Rate Last Dose  . iohexol (OMNIPAQUE) 350 MG/ML injection 80 mL  80 mL Intravenous Once PRN Medication Radiologist, MD        Physical Exam BP 147/74 mmHg  Pulse 60  Resp 20  Ht 5\' 8"  (1.727 m)  Wt 157 lb (71.215 kg)  BMI 23.88 kg/m2  SpO26 45% 80 year old man in no acute distress Hard of hearing Alert and oriented 3 with no focal motor deficits. Lungs clear with diminished breath sounds right base Cardiac regular rate and rhythm, no murmur Faint left carotid bruit  Diagnostic Tests: CT ANGIOGRAPHY CHEST WITH CONTRAST  TECHNIQUE: Multidetector CT imaging of the chest was performed using the standard protocol during bolus administration of intravenous contrast. Multiplanar CT image reconstructions and MIPs were obtained to evaluate the vascular anatomy.  CONTRAST: 80 mL of Omnipaque 350 intravenously.  COMPARISON: January 02, 2013.  FINDINGS: No pneumothorax or pleural effusion is noted. No significant pulmonary parenchymal abnormality is noted. No significant osseous abnormality is noted. Aneurysmal dilatation of ascending thoracic aorta is noted with maximum measured diameter 4.8 cm which is not significantly changed compared to prior exam. Transverse aortic arch measures 2.0 cm which is within normal limits. Descending thoracic aorta is normal in caliber are measuring 2.3 cm. Atherosclerotic calcifications are seen throughout the aorta without significant stenosis. No dissection is noted. No  mediastinal mass or adenopathy is noted. Visualized portion of upper abdomen appears normal.  Review of the MIP images confirms the above findings.  IMPRESSION: Stable aneurysmal dilatation of ascending thoracic aorta measuring 4.8 cm in maximum diameter.   Electronically Signed  By: Sabino Dick M.D.  On: 01/01/2014 14:19   Impression: 80 year old gentleman with a 4.8 cm ascending aortic aneurysm. This is been stable since 2010. It does require continued follow-up due to the risk of it continuing to expand over time.  He is aware of the symptoms of dissection and rupture and knows to call for emergency medical assistance immediately if those occur.  Plan: Return in one year with CT angio of chest

## 2014-02-19 ENCOUNTER — Encounter (HOSPITAL_COMMUNITY): Payer: Self-pay

## 2014-02-19 ENCOUNTER — Emergency Department (HOSPITAL_COMMUNITY)
Admission: EM | Admit: 2014-02-19 | Discharge: 2014-02-19 | Disposition: A | Discharge status: Home / Self Care | Payer: Medicare Other | Attending: Emergency Medicine | Admitting: Emergency Medicine

## 2014-02-19 ENCOUNTER — Emergency Department (HOSPITAL_COMMUNITY): Payer: Medicare Other

## 2014-02-19 DIAGNOSIS — E119 Type 2 diabetes mellitus without complications: Secondary | ICD-10-CM | POA: Diagnosis not present

## 2014-02-19 DIAGNOSIS — R059 Cough, unspecified: Secondary | ICD-10-CM

## 2014-02-19 DIAGNOSIS — K219 Gastro-esophageal reflux disease without esophagitis: Secondary | ICD-10-CM | POA: Diagnosis not present

## 2014-02-19 DIAGNOSIS — N400 Enlarged prostate without lower urinary tract symptoms: Secondary | ICD-10-CM | POA: Insufficient documentation

## 2014-02-19 DIAGNOSIS — Z85118 Personal history of other malignant neoplasm of bronchus and lung: Secondary | ICD-10-CM | POA: Diagnosis not present

## 2014-02-19 DIAGNOSIS — Z8739 Personal history of other diseases of the musculoskeletal system and connective tissue: Secondary | ICD-10-CM | POA: Diagnosis not present

## 2014-02-19 DIAGNOSIS — J209 Acute bronchitis, unspecified: Secondary | ICD-10-CM

## 2014-02-19 DIAGNOSIS — I10 Essential (primary) hypertension: Secondary | ICD-10-CM | POA: Diagnosis not present

## 2014-02-19 DIAGNOSIS — Z87891 Personal history of nicotine dependence: Secondary | ICD-10-CM | POA: Insufficient documentation

## 2014-02-19 DIAGNOSIS — R05 Cough: Secondary | ICD-10-CM

## 2014-02-19 DIAGNOSIS — Z79899 Other long term (current) drug therapy: Secondary | ICD-10-CM | POA: Insufficient documentation

## 2014-02-19 DIAGNOSIS — R0602 Shortness of breath: Secondary | ICD-10-CM | POA: Diagnosis present

## 2014-02-19 DIAGNOSIS — J441 Chronic obstructive pulmonary disease with (acute) exacerbation: Secondary | ICD-10-CM | POA: Insufficient documentation

## 2014-02-19 DIAGNOSIS — I2510 Atherosclerotic heart disease of native coronary artery without angina pectoris: Secondary | ICD-10-CM | POA: Insufficient documentation

## 2014-02-19 LAB — CBC WITH DIFFERENTIAL/PLATELET
BASOS ABS: 0 10*3/uL (ref 0.0–0.1)
Basophils Relative: 1 % (ref 0–1)
EOS ABS: 0.2 10*3/uL (ref 0.0–0.7)
EOS PCT: 3 % (ref 0–5)
HCT: 47.1 % (ref 39.0–52.0)
HEMOGLOBIN: 15.1 g/dL (ref 13.0–17.0)
LYMPHS ABS: 2.1 10*3/uL (ref 0.7–4.0)
LYMPHS PCT: 32 % (ref 12–46)
MCH: 29.5 pg (ref 26.0–34.0)
MCHC: 32.1 g/dL (ref 30.0–36.0)
MCV: 92.2 fL (ref 78.0–100.0)
Monocytes Absolute: 0.6 10*3/uL (ref 0.1–1.0)
Monocytes Relative: 9 % (ref 3–12)
Neutro Abs: 3.6 10*3/uL (ref 1.7–7.7)
Neutrophils Relative %: 55 % (ref 43–77)
Platelets: 182 10*3/uL (ref 150–400)
RBC: 5.11 MIL/uL (ref 4.22–5.81)
RDW: 13.6 % (ref 11.5–15.5)
WBC: 6.4 10*3/uL (ref 4.0–10.5)

## 2014-02-19 LAB — BASIC METABOLIC PANEL
ANION GAP: 7 (ref 5–15)
BUN: 14 mg/dL (ref 6–23)
CALCIUM: 9.3 mg/dL (ref 8.4–10.5)
CO2: 28 mmol/L (ref 19–32)
Chloride: 100 mEq/L (ref 96–112)
Creatinine, Ser: 0.8 mg/dL (ref 0.50–1.35)
GFR calc non Af Amer: 82 mL/min — ABNORMAL LOW (ref >90)
Glucose, Bld: 127 mg/dL — ABNORMAL HIGH (ref 70–99)
Potassium: 3.9 mmol/L (ref 3.5–5.1)
Sodium: 135 mmol/L (ref 135–145)

## 2014-02-19 LAB — BRAIN NATRIURETIC PEPTIDE: B Natriuretic Peptide: 53 pg/mL (ref 0.0–100.0)

## 2014-02-19 LAB — TROPONIN I

## 2014-02-19 MED ORDER — IPRATROPIUM-ALBUTEROL 0.5-2.5 (3) MG/3ML IN SOLN
3.0000 mL | Freq: Once | RESPIRATORY_TRACT | Status: AC
  Administered 2014-02-19: 3 mL via RESPIRATORY_TRACT
  Filled 2014-02-19: qty 3

## 2014-02-19 MED ORDER — METHYLPREDNISOLONE SODIUM SUCC 125 MG IJ SOLR
125.0000 mg | Freq: Once | INTRAMUSCULAR | Status: AC
  Administered 2014-02-19: 125 mg via INTRAVENOUS
  Filled 2014-02-19: qty 2

## 2014-02-19 MED ORDER — PREDNISONE 50 MG PO TABS: 50.0000 mg | ORAL_TABLET | Freq: Every day | ORAL | Status: DC

## 2014-02-19 MED ORDER — ALBUTEROL SULFATE HFA 108 (90 BASE) MCG/ACT IN AERS: 2.0000 | INHALATION_SPRAY | RESPIRATORY_TRACT | Status: DC | PRN

## 2014-02-19 NOTE — ED Notes (Signed)
Patient denies pain and is resting comfortably.  

## 2014-02-19 NOTE — Discharge Instructions (Signed)
Acute Bronchitis Bronchitis is inflammation of the airways that extend from the windpipe into the lungs (bronchi). The inflammation often causes mucus to develop. This leads to a cough, which is the most common symptom of bronchitis.  In acute bronchitis, the condition usually develops suddenly and goes away over time, usually in a couple weeks. Smoking, allergies, and asthma can make bronchitis worse. Repeated episodes of bronchitis may cause further lung problems.  CAUSES Acute bronchitis is most often caused by the same virus that causes a cold. The virus can spread from person to person (contagious) through coughing, sneezing, and touching contaminated objects. SIGNS AND SYMPTOMS   Cough.   Fever.   Coughing up mucus.   Body aches.   Chest congestion.   Chills.   Shortness of breath.   Sore throat.  DIAGNOSIS  Acute bronchitis is usually diagnosed through a physical exam. Your health care provider will also ask you questions about your medical history. Tests, such as chest X-rays, are sometimes done to rule out other conditions.  TREATMENT  Acute bronchitis usually goes away in a couple weeks. Oftentimes, no medical treatment is necessary. Medicines are sometimes given for relief of fever or cough. Antibiotic medicines are usually not needed but may be prescribed in certain situations. In some cases, an inhaler may be recommended to help reduce shortness of breath and control the cough. A cool mist vaporizer may also be used to help thin bronchial secretions and make it easier to clear the chest.  HOME CARE INSTRUCTIONS  Get plenty of rest.   Drink enough fluids to keep your urine clear or pale yellow (unless you have a medical condition that requires fluid restriction). Increasing fluids may help thin your respiratory secretions (sputum) and reduce chest congestion, and it will prevent dehydration.   Take medicines only as directed by your health care provider.  If  you were prescribed an antibiotic medicine, finish it all even if you start to feel better.  Avoid smoking and secondhand smoke. Exposure to cigarette smoke or irritating chemicals will make bronchitis worse. If you are a smoker, consider using nicotine gum or skin patches to help control withdrawal symptoms. Quitting smoking will help your lungs heal faster.   Reduce the chances of another bout of acute bronchitis by washing your hands frequently, avoiding people with cold symptoms, and trying not to touch your hands to your mouth, nose, or eyes.   Keep all follow-up visits as directed by your health care provider.  SEEK MEDICAL CARE IF: Your symptoms do not improve after 1 week of treatment.  SEEK IMMEDIATE MEDICAL CARE IF:  You develop an increased fever or chills.   You have chest pain.   You have severe shortness of breath.  You have bloody sputum.   You develop dehydration.  You faint or repeatedly feel like you are going to pass out.  You develop repeated vomiting.  You develop a severe headache. MAKE SURE YOU:   Understand these instructions.  Will watch your condition.  Will get help right away if you are not doing well or get worse. Document Released: 03/11/2004 Document Revised: 06/18/2013 Document Reviewed: 07/25/2012 [facility] Patient Information 2015 [place], [place]. This information is not intended to replace advice given to you by your health care provider. Make sure you discuss any questions you have with your health care provider.  Albuterol inhalation aerosol What is this medicine? ALBUTEROL (al Normajean [place]) is a bronchodilator. It helps open up the airways in your  lungs to make it easier to breathe. This medicine is used to treat and to prevent bronchospasm. This medicine may be used for other purposes; ask your health care provider or pharmacist if you have questions. COMMON BRAND NAME(S): Proair HFA, Proventil, Proventil HFA, Respirol, Ventolin,  Ventolin HFA What should I tell my health care provider before I take this medicine? They need to know if you have any of the following conditions: -diabetes -heart disease or irregular heartbeat -high blood pressure -pheochromocytoma -seizures -thyroid disease -an unusual or allergic reaction to albuterol, levalbuterol, sulfites, other medicines, foods, dyes, or preservatives -pregnant or trying to get pregnant -breast-feeding How should I use this medicine? This medicine is for inhalation through the mouth. Follow the directions on your prescription label. Take your medicine at regular intervals. Do not use more often than directed. Make sure that you are using your inhaler correctly. Ask you doctor or health care provider if you have any questions. Talk to your pediatrician regarding the use of this medicine in children. Special care may be needed. Overdosage: If you think you have taken too much of this medicine contact a poison control center or emergency room at once. NOTE: This medicine is only for you. Do not share this medicine with others. What if I miss a dose? If you miss a dose, use it as soon as you can. If it is almost time for your next dose, use only that dose. Do not use double or extra doses. What may interact with this medicine? -anti-infectives like chloroquine and pentamidine -caffeine -cisapride -diuretics -medicines for colds -medicines for depression or for emotional or psychotic conditions -medicines for weight loss including some herbal products -methadone -some antibiotics like clarithromycin, erythromycin, levofloxacin, and linezolid -some heart medicines -steroid hormones like dexamethasone, cortisone, hydrocortisone -theophylline -thyroid hormones This list may not describe all possible interactions. Give your health care provider a list of all the medicines, herbs, non-prescription drugs, or dietary supplements you use. Also tell them if you smoke,  drink alcohol, or use illegal drugs. Some items may interact with your medicine. What should I watch for while using this medicine? Tell your doctor or health care professional if your symptoms do not improve. Do not use extra albuterol. If your asthma or bronchitis gets worse while you are using this medicine, call your doctor right away. If your mouth gets dry try chewing sugarless gum or sucking hard candy. Drink water as directed. What side effects may I notice from receiving this medicine? Side effects that you should report to your doctor or health care professional as soon as possible: -allergic reactions like skin rash, itching or hives, swelling of the face, lips, or tongue -breathing problems -chest pain -feeling faint or lightheaded, falls -high blood pressure -irregular heartbeat -fever -muscle cramps or weakness -pain, tingling, numbness in the hands or feet -vomiting Side effects that usually do not require medical attention (report to your doctor or health care professional if they continue or are bothersome): -cough -difficulty sleeping -headache -nervousness or trembling -stomach upset -stuffy or runny nose -throat irritation -unusual taste This list may not describe all possible side effects. Call your doctor for medical advice about side effects. You may report side effects to FDA at 1-800-FDA-1088. Where should I keep my medicine? Keep out of the reach of children. Store at room temperature between 15 and 30 degrees C (59 and 86 degrees F). The contents are under pressure and may burst when exposed to heat or flame. Do not  freeze. This medicine does not work as well if it is too cold. Throw away any unused medicine after the expiration date. Inhalers need to be thrown away after the labeled number of puffs have been used or by the expiration date; whichever comes first. Ventolin HFA should be thrown away 12 months after removing from foil pouch. Check the instructions  that come with your medicine. NOTE: This sheet is a summary. It may not cover all possible information. If you have questions about this medicine, talk to your doctor, pharmacist, or health care provider.  2015, Elsevier/Gold Standard. (2012-07-20 10:57:17)  Prednisone tablets What is this medicine? PREDNISONE (PRED ni sone) is a corticosteroid. It is commonly used to treat inflammation of the skin, joints, lungs, and other organs. Common conditions treated include asthma, allergies, and arthritis. It is also used for other conditions, such as blood disorders and diseases of the adrenal glands. This medicine may be used for other purposes; ask your health care provider or pharmacist if you have questions. COMMON BRAND NAME(S): Deltasone, Predone, Sterapred, Sterapred DS What should I tell my health care provider before I take this medicine? They need to know if you have any of these conditions: -Cushing's syndrome -diabetes -glaucoma -heart disease -high blood pressure -infection (especially a virus infection such as chickenpox, cold sores, or herpes) -kidney disease -liver disease -mental illness -myasthenia gravis -osteoporosis -seizures -stomach or intestine problems -thyroid disease -an unusual or allergic reaction to lactose, prednisone, other medicines, foods, dyes, or preservatives -pregnant or trying to get pregnant -breast-feeding How should I use this medicine? Take this medicine by mouth with a glass of water. Follow the directions on the prescription label. Take this medicine with food. If you are taking this medicine once a day, take it in the morning. Do not take more medicine than you are told to take. Do not suddenly stop taking your medicine because you may develop a severe reaction. Your doctor will tell you how much medicine to take. If your doctor wants you to stop the medicine, the dose may be slowly lowered over time to avoid any side effects. Talk to your  pediatrician regarding the use of this medicine in children. Special care may be needed. Overdosage: If you think you have taken too much of this medicine contact a poison control center or emergency room at once. NOTE: This medicine is only for you. Do not share this medicine with others. What if I miss a dose? If you miss a dose, take it as soon as you can. If it is almost time for your next dose, talk to your doctor or health care professional. You may need to miss a dose or take an extra dose. Do not take double or extra doses without advice. What may interact with this medicine? Do not take this medicine with any of the following medications: -metyrapone -mifepristone This medicine may also interact with the following medications: -aminoglutethimide -amphotericin B -aspirin and aspirin-like medicines -barbiturates -certain medicines for diabetes, like glipizide or glyburide -cholestyramine -cholinesterase inhibitors -cyclosporine -digoxin -diuretics -ephedrine -male hormones, like estrogens and birth control pills -isoniazid -ketoconazole -NSAIDS, medicines for pain and inflammation, like ibuprofen or naproxen -phenytoin -rifampin -toxoids -vaccines -warfarin This list may not describe all possible interactions. Give your health care provider a list of all the medicines, herbs, non-prescription drugs, or dietary supplements you use. Also tell them if you smoke, drink alcohol, or use illegal drugs. Some items may interact with your medicine. What should I watch  for while using this medicine? Visit your doctor or health care professional for regular checks on your progress. If you are taking this medicine over a prolonged period, carry an identification card with your name and address, the type and dose of your medicine, and your doctor's name and address. This medicine may increase your risk of getting an infection. Tell your doctor or health care professional if you are around  anyone with measles or chickenpox, or if you develop sores or blisters that do not heal properly. If you are going to have surgery, tell your doctor or health care professional that you have taken this medicine within the last twelve months. Ask your doctor or health care professional about your diet. You may need to lower the amount of salt you eat. This medicine may affect blood sugar levels. If you have diabetes, check with your doctor or health care professional before you change your diet or the dose of your diabetic medicine. What side effects may I notice from receiving this medicine? Side effects that you should report to your doctor or health care professional as soon as possible: -allergic reactions like skin rash, itching or hives, swelling of the face, lips, or tongue -changes in emotions or moods -changes in vision -depressed mood -eye pain -fever or chills, cough, sore throat, pain or difficulty passing urine -increased thirst -swelling of ankles, feet Side effects that usually do not require medical attention (report to your doctor or health care professional if they continue or are bothersome): -confusion, excitement, restlessness -headache -nausea, vomiting -skin problems, acne, thin and shiny skin -trouble sleeping -weight gain This list may not describe all possible side effects. Call your doctor for medical advice about side effects. You may report side effects to FDA at 1-800-FDA-1088. Where should I keep my medicine? Keep out of the reach of children. Store at room temperature between 15 and 30 degrees C (59 and 86 degrees F). Protect from light. Keep container tightly closed. Throw away any unused medicine after the expiration date. NOTE: This sheet is a summary. It may not cover all possible information. If you have questions about this medicine, talk to your doctor, pharmacist, or health care provider.  2015, Elsevier/Gold Standard. (2010-09-17 10:57:14)

## 2014-02-19 NOTE — ED Provider Notes (Signed)
CSN: [id number]     Arrival date & time 02/19/14  0443 History   First MD Initiated Contact with Patient 02/19/14 [id number]     Chief Complaint  Patient presents with  . Shortness of Breath     (Consider location/radiation/quality/duration/timing/severity/associated sxs/prior Treatment) Patient is a 81 y.o. male presenting with shortness of breath. The history is provided by the patient.  Shortness of Breath He developed shortness of breath tonight. Shortness of breath is worse when he lay flat. There is no associated chest pain, heaviness, tightness, pressure. He has not noticed any leg swelling. He has had a cough for the last 2 weeks. Cough is productive of a large amount of clear to white sputum. On one occasion, he had some blood and when he coughed up. He denies fever, chills, sweats. He has not done anything at home to treat his cough or dyspnea. He does relate that he has a history of an aneurysm of the ascending aorta and also history of a spontaneous pneumothorax.  Past Medical History  Diagnosis Date  . GERD (gastroesophageal reflux disease)   . Arthritis   . COPD (chronic obstructive pulmonary disease) 05/22/2008    FEV1/FVS 44.9  . Squamous cell carcinoma lung 2000    CT w/ CM; 04/25/2009 COPD, s/p RLL, CAD, 5 cm ascending aortic aneurysm  . Spontaneous pneumothorax   . BPH (benign prostatic hyperplasia)   . Hyperlipidemia   . Precordial pain   . Jaw pain   . CAD (coronary artery disease)     s/p Xience stent ot the distal RAC 2010, PCI with Xience stent to distal RCA at the [facility] 2011  . Hypertension   . Thoracic ascending aortic aneurysm     4.8 cm   . Diabetes mellitus     Well controlled  . Type II or unspecified type diabetes mellitus without mention of complication, not stated as uncontrolled    Past Surgical History  Procedure Laterality Date  . Lobectomy  2000    Right lower for squamous cell ca  . Needle removed from the right foot    . Lumbar laminectomy  1978   . Lymph node resected  1983    Axilla  . Spontaneous pneumothrax  1979  . Cardiac catheterization  2010    Stent  . Back surgery    . I&d extremity Left 11/10/2012    Procedure: IRRIGATION AND DEBRIDEMENT Left third-long finger ;  Surgeon: Jolyn Nap, MD;  Location: [place];  Service: Orthopedics;  Laterality: Left;   Family History  Problem Relation Age of Onset  . Heart disease Neg Hx   . Diabetes Father   . Alcohol abuse Father    History  Substance Use Topics  . Smoking status: Former Smoker    Quit date: 02/16/1984  . Smokeless tobacco: Not on file     Comment: Smoked for 30 years  . Alcohol Use: Yes     Comment: wine with supper    Review of Systems  Respiratory: Positive for shortness of breath.   All other systems reviewed and are negative.     Allergies  Review of patient's allergies indicates no known allergies.  Home Medications   Prior to Admission medications   Medication Sig Start Date End Date Taking? Authorizing Provider  Cholecalciferol ([facility] VIT D 5000 HIGH-POTENCY PO) Take by mouth daily.    Historical Provider, MD  glucosamine-chondroitin 500-400 MG tablet Take 1 tablet by mouth 2 (two) times daily.  Historical Provider, MD  losartan (COZAAR) 25 MG tablet Take 1 tablet (25 mg total) by mouth daily. 09/10/13   Minus Breeding, MD  magnesium oxide (MAG-OX) 400 MG tablet Take 400 mg by mouth daily.    Historical Provider, MD  Tamsulosin HCl (FLOMAX) 0.4 MG CAPS Take 0.4 mg by mouth daily.     Historical Provider, MD  vitamin B-12 (CYANOCOBALAMIN) 100 MCG tablet Take 50 mcg by mouth daily.    Historical Provider, MD   BP 131/70 mmHg  Pulse 93  Temp(Src) 98.1 F (36.7 C) (Oral)  Resp 18  Ht 5\' 7"  (1.702 m)  Wt 154 lb (69.854 kg)  BMI 24.11 kg/m2  SpO2 99% Physical Exam  Nursing note and vitals reviewed.  81 year old male, resting comfortably and in no acute distress. Vital signs are normal. Oxygen saturation is 99%, which is normal. Head  is normocephalic and atraumatic. PERRLA, EOMI. Oropharynx is clear. Neck is nontender and supple without adenopathy or JVD. Back is nontender and there is no CVA tenderness. Lungs  have coarse inspiratory rhonchi and expiratory wheezes and rhonchi. No rales are appreciated. Chest is nontender. Heart has regular rate and rhythm without murmur. Abdomen is soft, flat, nontender without masses or hepatosplenomegaly and peristalsis is normoactive. Extremities have 1+ edema, full range of motion is present. Skin is warm and dry without rash. Neurologic: Mental status is normal, cranial nerves are intact, there are no motor or sensory deficits.  ED Course  Procedures (including critical care time) Labs Review Results for orders placed or performed during the hospital encounter of 30/16/01  Basic metabolic panel  Result Value Ref Range   Sodium 135 135 - 145 mmol/L   Potassium 3.9 3.5 - 5.1 mmol/L   Chloride 100 96 - 112 mEq/L   CO2 28 19 - 32 mmol/L   Glucose, Bld 127 (H) 70 - 99 mg/dL   BUN 14 6 - 23 mg/dL   Creatinine, Ser 0.80 0.50 - 1.35 mg/dL   Calcium 9.3 8.4 - 10.5 mg/dL   GFR calc non Af Amer 82 (L) >90 mL/min   GFR calc Af Amer >90 >90 mL/min   Anion gap 7 5 - 15  CBC with Differential  Result Value Ref Range   WBC 6.4 4.0 - 10.5 K/uL   RBC 5.11 4.22 - 5.81 MIL/uL   Hemoglobin 15.1 13.0 - 17.0 g/dL   HCT 47.1 39.0 - 52.0 %   MCV 92.2 78.0 - 100.0 fL   MCH 29.5 26.0 - 34.0 pg   MCHC 32.1 30.0 - 36.0 g/dL   RDW 13.6 11.5 - 15.5 %   Platelets 182 150 - 400 K/uL   Neutrophils Relative % 55 43 - 77 %   Neutro Abs 3.6 1.7 - 7.7 K/uL   Lymphocytes Relative 32 12 - 46 %   Lymphs Abs 2.1 0.7 - 4.0 K/uL   Monocytes Relative 9 3 - 12 %   Monocytes Absolute 0.6 0.1 - 1.0 K/uL   Eosinophils Relative 3 0 - 5 %   Eosinophils Absolute 0.2 0.0 - 0.7 K/uL   Basophils Relative 1 0 - 1 %   Basophils Absolute 0.0 0.0 - 0.1 K/uL  Troponin I  Result Value Ref Range   Troponin I <0.03  <0.031 ng/mL  Brain natriuretic peptide  Result Value Ref Range   B Natriuretic Peptide 53.0 0.0 - 100.0 pg/mL   Imaging Review Dg Chest 2 View  02/19/2014   CLINICAL DATA:  Cough, congestion, and shortness of breath for a few days.  EXAM: CHEST  2 VIEW  COMPARISON:  03/12/2013  FINDINGS: With normal heart size and pulmonary vascularity. Emphysematous changes in the lungs. Scarring in the right lung base. No focal airspace disease. No blunting of costophrenic angles. No pneumothorax. Calcified aorta. Degenerative changes in the spine. Surgical clips in the mediastinum. No change since prior study.  IMPRESSION: Emphysematous changes and scarring in the lungs. No active cardiopulmonary disease.   Electronically Signed   By: Lucienne Capers M.D.   On: 02/19/2014 05:15     EKG Interpretation   Date/Time:  Tuesday February 19 2014 04:49:51 EST Ventricular Rate:  84 PR Interval:  155 QRS Duration: 148 QT Interval:  419 QTC Calculation: 495 R Axis:   65 Text Interpretation:  Sinus rhythm with frequent Premature ventricular  complexes Right bundle branch block When compared with ECG of 03/18/2013, No  significant change was found Confirmed by [facility]  MD, Elisandro Jarrett ([id number]) on  02/19/2014 4:52:09 AM      MDM   Final diagnoses:  Cough  Acute bronchitis, unspecified organism    dyspnea secondary to bronchospasm. This is either from bronchitis or pneumonia. Although there is some edema, doubt clinical significance CHF. Chest x-ray is obtained and he will be given albuterol with ipratropium via nebulizer.  Following above-noted treatment, wheezes and rhonchi improved but there were still slightly prolonged expiration phase. Albuterol with ipratropium was repeated with further improvement. Chest x-ray is negative for pneumonia and WBC is normal. BNP is normal as well. Patient was ambulated in the emergency department with no oxygen desaturation. He is discharged with prescription for albuterol inhaler. He  was given initial dose of methylprednisolone and is also given a prescription for a short course of prednisone. Follow-up with PCP.  Delora Fuel, MD 43/32/95 [id number]

## 2014-02-19 NOTE — ED Notes (Signed)
Pt ambulated with pulse ox, sats improved to 100% and pt felt better

## 2014-02-19 NOTE — ED Notes (Signed)
Productive cough x 2 weeks, tonight with sob per ems, also c/o dizziness.  Generalized achiness

## 2014-06-02 ENCOUNTER — Emergency Department (HOSPITAL_COMMUNITY)
Admission: EM | Admit: 2014-06-02 | Discharge: 2014-06-02 | Disposition: A | Discharge status: Home / Self Care | Payer: Medicare Other | Attending: Emergency Medicine | Admitting: Emergency Medicine

## 2014-06-02 ENCOUNTER — Encounter (HOSPITAL_COMMUNITY): Payer: Self-pay | Admitting: Emergency Medicine

## 2014-06-02 DIAGNOSIS — E785 Hyperlipidemia, unspecified: Secondary | ICD-10-CM | POA: Diagnosis not present

## 2014-06-02 DIAGNOSIS — Z85828 Personal history of other malignant neoplasm of skin: Secondary | ICD-10-CM | POA: Diagnosis not present

## 2014-06-02 DIAGNOSIS — Z7952 Long term (current) use of systemic steroids: Secondary | ICD-10-CM | POA: Diagnosis not present

## 2014-06-02 DIAGNOSIS — Y9389 Activity, other specified: Secondary | ICD-10-CM | POA: Diagnosis not present

## 2014-06-02 DIAGNOSIS — Y998 Other external cause status: Secondary | ICD-10-CM | POA: Diagnosis not present

## 2014-06-02 DIAGNOSIS — Z23 Encounter for immunization: Secondary | ICD-10-CM | POA: Insufficient documentation

## 2014-06-02 DIAGNOSIS — Z87891 Personal history of nicotine dependence: Secondary | ICD-10-CM | POA: Insufficient documentation

## 2014-06-02 DIAGNOSIS — I2510 Atherosclerotic heart disease of native coronary artery without angina pectoris: Secondary | ICD-10-CM | POA: Insufficient documentation

## 2014-06-02 DIAGNOSIS — J449 Chronic obstructive pulmonary disease, unspecified: Secondary | ICD-10-CM | POA: Insufficient documentation

## 2014-06-02 DIAGNOSIS — Z8709 Personal history of other diseases of the respiratory system: Secondary | ICD-10-CM | POA: Insufficient documentation

## 2014-06-02 DIAGNOSIS — Z87448 Personal history of other diseases of urinary system: Secondary | ICD-10-CM | POA: Diagnosis not present

## 2014-06-02 DIAGNOSIS — Z79899 Other long term (current) drug therapy: Secondary | ICD-10-CM | POA: Insufficient documentation

## 2014-06-02 DIAGNOSIS — Y288XXA Contact with other sharp object, undetermined intent, initial encounter: Secondary | ICD-10-CM | POA: Insufficient documentation

## 2014-06-02 DIAGNOSIS — E119 Type 2 diabetes mellitus without complications: Secondary | ICD-10-CM | POA: Diagnosis not present

## 2014-06-02 DIAGNOSIS — Y9289 Other specified places as the place of occurrence of the external cause: Secondary | ICD-10-CM | POA: Insufficient documentation

## 2014-06-02 DIAGNOSIS — M1990 Unspecified osteoarthritis, unspecified site: Secondary | ICD-10-CM | POA: Insufficient documentation

## 2014-06-02 DIAGNOSIS — S61432A Puncture wound without foreign body of left hand, initial encounter: Secondary | ICD-10-CM | POA: Diagnosis not present

## 2014-06-02 DIAGNOSIS — I10 Essential (primary) hypertension: Secondary | ICD-10-CM | POA: Insufficient documentation

## 2014-06-02 MED ORDER — AMOXICILLIN-POT CLAVULANATE 875-125 MG PO TABS: 1.0000 | ORAL_TABLET | Freq: Two times a day (BID) | ORAL | Status: DC

## 2014-06-02 MED ORDER — AMOXICILLIN-POT CLAVULANATE 875-125 MG PO TABS
1.0000 | ORAL_TABLET | Freq: Two times a day (BID) | ORAL | Status: DC
  Administered 2014-06-02: 1 via ORAL
  Filled 2014-06-02: qty 1

## 2014-06-02 MED ORDER — TETANUS-DIPHTH-ACELL PERTUSSIS 5-2.5-18.5 LF-MCG/0.5 IM SUSP
0.5000 mL | Freq: Once | INTRAMUSCULAR | Status: AC
  Administered 2014-06-02: 0.5 mL via INTRAMUSCULAR
  Filled 2014-06-02: qty 0.5
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
## 2014-06-02 NOTE — Discharge Instructions (Signed)
Puncture Wound A puncture wound is an injury that extends through all layers of the skin and into the tissue beneath the skin (subcutaneous tissue). Puncture wounds become infected easily because germs often enter the body and go beneath the skin during the injury. Having a deep wound with a small entrance point makes it difficult for your caregiver to adequately clean the wound. This is especially true if you have stepped on a nail and it has passed through a dirty shoe or other situations where the wound is obviously contaminated. CAUSES  Many puncture wounds involve glass, nails, splinters, fish hooks, or other objects that enter the skin (foreign bodies). A puncture wound may also be caused by a human bite or animal bite. DIAGNOSIS  A puncture wound is usually diagnosed by your history and a physical exam. You may need to have an X-ray or an ultrasound to check for any foreign bodies still in the wound. TREATMENT   Your caregiver will clean the wound as thoroughly as possible. Depending on the location of the wound, a bandage (dressing) may be applied.  Your caregiver might prescribe antibiotic medicines.  You may need a follow-up visit to check on your wound. Follow all instructions as directed by your caregiver. HOME CARE INSTRUCTIONS   Change your dressing once per day, or as directed by your caregiver. If the dressing sticks, it may be removed by soaking the area in water.  If your caregiver has given you follow-up instructions, it is very important that you return for a follow-up appointment. Not following up as directed could result in a chronic or permanent injury, pain, and disability.  Only take over-the-counter or prescription medicines for pain, discomfort, or fever as directed by your caregiver.  If you are given antibiotics, take them as directed. Finish them even if you start to feel better. You may need a tetanus shot if:  You cannot remember when you had your last tetanus  shot.  You have never had a tetanus shot. If you got a tetanus shot, your arm may swell, get red, and feel warm to the touch. This is common and not a problem. If you need a tetanus shot and you choose not to have one, there is a rare chance of getting tetanus. Sickness from tetanus can be serious. You may need a rabies shot if an animal bite caused your puncture wound. SEEK MEDICAL CARE IF:   You have redness, swelling, or increasing pain in the wound.  You have red streaks going away from the wound.  You notice a bad smell coming from the wound or dressing.  You have yellowish-white fluid (pus) coming from the wound.  You are treated with an antibiotic for infection, but the infection is not getting better.  You notice something in the wound, such as rubber from your shoe, cloth, or another object.  You have a fever.  You have severe pain.  You have difficulty breathing.  You feel dizzy or faint.  You cannot stop vomiting.  You lose feeling, develop numbness, or cannot move a limb below the wound.  Your symptoms worsen. MAKE SURE YOU:  Understand these instructions.  Will watch your condition.  Will get help right away if you are not doing well or get worse. Document Released: 11/11/2004 Document Revised: 04/26/2011 Document Reviewed: 07/21/2010 [facility] Patient Information 2015 [place], [place]. This information is not intended to replace advice given to you by your health care provider. Make sure you discuss any questions you  have with your health care provider. ° °

## 2014-06-02 NOTE — ED Provider Notes (Signed)
CSN: [id number]     Arrival date & time 06/02/14  1048 History   First MD Initiated Contact with Patient 06/02/14 1103     Chief Complaint  Patient presents with  . Puncture Wound    The history is provided by the patient. No language interpreter was used.   This chart was scribed for non-physician practitioner Charlann Lange, PA-C, working with Francine Graven, DO, by Thea Alken, ED Scribe. This patient was seen in room APFT24/APFT24 and the patient's care was started at 11:05 AM.  Brian Austin is a 81 y.o. male who presents to the Emergency Department complaining of a puncture wound to left hand that occurred 4 days ago. Pt states he was punctured by a piece of barbwire 4 days ago and now believes area may be infected due to streaking up left forearm. Pt denies drainage from wound. Pt denies drug allergies. Pt does not have PCP.    Past Medical History  Diagnosis Date  . GERD (gastroesophageal reflux disease)   . Arthritis   . COPD (chronic obstructive pulmonary disease) 05/22/2008    FEV1/FVS 44.9  . Squamous cell carcinoma lung 2000    CT w/ CM; 04/25/2009 COPD, s/p RLL, CAD, 5 cm ascending aortic aneurysm  . Spontaneous pneumothorax   . BPH (benign prostatic hyperplasia)   . Hyperlipidemia   . Precordial pain   . Jaw pain   . CAD (coronary artery disease)     s/p Xience stent ot the distal RAC 2010, PCI with Xience stent to distal RCA at the [facility] 2011  . Hypertension   . Thoracic ascending aortic aneurysm     4.8 cm   . Diabetes mellitus     Well controlled  . Type II or unspecified type diabetes mellitus without mention of complication, not stated as uncontrolled    Past Surgical History  Procedure Laterality Date  . Lobectomy  2000    Right lower for squamous cell ca  . Needle removed from the right foot    . Lumbar laminectomy  1978  . Lymph node resected  1983    Axilla  . Spontaneous pneumothrax  1979  . Cardiac catheterization  2010    Stent  . Back  surgery    . I&d extremity Left 11/10/2012    Procedure: IRRIGATION AND DEBRIDEMENT Left third-long finger ;  Surgeon: Jolyn Nap, MD;  Location: [place];  Service: Orthopedics;  Laterality: Left;   Family History  Problem Relation Age of Onset  . Heart disease Neg Hx   . Diabetes Father   . Alcohol abuse Father    History  Substance Use Topics  . Smoking status: Former Smoker    Quit date: 02/16/1984  . Smokeless tobacco: Not on file     Comment: Smoked for 30 years  . Alcohol Use: Yes     Comment: wine with supper    Review of Systems  Constitutional: Negative for fever and chills.  Skin: Positive for color change and wound.   Allergies  Review of patient's allergies indicates no known allergies.  Home Medications   Prior to Admission medications   Medication Sig Start Date End Date Taking? Authorizing Provider  albuterol (PROVENTIL HFA;VENTOLIN HFA) 108 (90 BASE) MCG/ACT inhaler Inhale 2 puffs into the lungs every 4 (four) hours as needed for wheezing or shortness of breath (or coughing). 04/20/12   Delora Fuel, MD  Cholecalciferol ([employer] VIT D 5000 HIGH-POTENCY PO) Take by mouth daily.  Historical Provider, MD  glucosamine-chondroitin 500-400 MG tablet Take 1 tablet by mouth 2 (two) times daily.     Historical Provider, MD  losartan (COZAAR) 25 MG tablet Take 1 tablet (25 mg total) by mouth daily. 09/10/13   Minus Breeding, MD  magnesium oxide (MAG-OX) 400 MG tablet Take 400 mg by mouth daily.    Historical Provider, MD  predniSONE (DELTASONE) 50 MG tablet Take 1 tablet (50 mg total) by mouth daily. 02/21/08   Delora Fuel, MD  Tamsulosin HCl (FLOMAX) 0.4 MG CAPS Take 0.4 mg by mouth daily.     Historical Provider, MD  vitamin B-12 (CYANOCOBALAMIN) 100 MCG tablet Take 50 mcg by mouth daily.    Historical Provider, MD   There were no vitals taken for this visit. Physical Exam  Constitutional: He is oriented to person, place, and time. He appears well-developed and  well-nourished. No distress.  HENT:  Head: Normocephalic and atraumatic.  Eyes: Conjunctivae and EOM are normal.  Neck: Neck supple.  Cardiovascular: Normal rate.   Pulmonary/Chest: Effort normal.  Musculoskeletal: Normal range of motion.  Neurological: He is alert and oriented to person, place, and time.  Skin: Skin is warm and dry.  Small puncture hypothenar surface of the left hand. Mild localized without erythema or tenderness. There is red streaking to mid forearm. Full ROM  Psychiatric: He has a normal mood and affect. His behavior is normal.  Nursing note and vitals reviewed.   ED Course  Procedures (including critical care time)  COORDINATION OF CARE: 11:30 AM- Pt advised of plan for treatment and pt agrees.  Labs Review Labs Reviewed - No data to display  Imaging Review No results found.   EKG Interpretation None      MDM   Final diagnoses:  None    1. Puncture wound, hand  Red streaking into forearm concerning for early infection, although no tenderness, induration, erythema around wound or drainage. Will start on Augmentin, update tetanus, and recheck wound in 2 days.   I personally performed the services described in this documentation, which was scribed in my presence. The recorded information has been reviewed and is accurate.     Charlann Lange, PA-C 06/02/14 [place], DO 06/02/14 [phone]

## 2014-06-04 ENCOUNTER — Encounter (HOSPITAL_COMMUNITY): Payer: Self-pay | Admitting: Emergency Medicine

## 2014-06-04 ENCOUNTER — Emergency Department (HOSPITAL_COMMUNITY)
Admission: EM | Admit: 2014-06-04 | Discharge: 2014-06-04 | Disposition: A | Discharge status: Home / Self Care | Payer: Medicare Other | Attending: Emergency Medicine | Admitting: Emergency Medicine

## 2014-06-04 DIAGNOSIS — Z792 Long term (current) use of antibiotics: Secondary | ICD-10-CM | POA: Insufficient documentation

## 2014-06-04 DIAGNOSIS — Z79899 Other long term (current) drug therapy: Secondary | ICD-10-CM | POA: Insufficient documentation

## 2014-06-04 DIAGNOSIS — I2510 Atherosclerotic heart disease of native coronary artery without angina pectoris: Secondary | ICD-10-CM | POA: Insufficient documentation

## 2014-06-04 DIAGNOSIS — Z87891 Personal history of nicotine dependence: Secondary | ICD-10-CM | POA: Diagnosis not present

## 2014-06-04 DIAGNOSIS — N400 Enlarged prostate without lower urinary tract symptoms: Secondary | ICD-10-CM | POA: Diagnosis not present

## 2014-06-04 DIAGNOSIS — E119 Type 2 diabetes mellitus without complications: Secondary | ICD-10-CM | POA: Diagnosis not present

## 2014-06-04 DIAGNOSIS — Z7952 Long term (current) use of systemic steroids: Secondary | ICD-10-CM | POA: Insufficient documentation

## 2014-06-04 DIAGNOSIS — Z9889 Other specified postprocedural states: Secondary | ICD-10-CM | POA: Insufficient documentation

## 2014-06-04 DIAGNOSIS — Z8719 Personal history of other diseases of the digestive system: Secondary | ICD-10-CM | POA: Insufficient documentation

## 2014-06-04 DIAGNOSIS — I10 Essential (primary) hypertension: Secondary | ICD-10-CM | POA: Insufficient documentation

## 2014-06-04 DIAGNOSIS — J449 Chronic obstructive pulmonary disease, unspecified: Secondary | ICD-10-CM | POA: Insufficient documentation

## 2014-06-04 DIAGNOSIS — Z85828 Personal history of other malignant neoplasm of skin: Secondary | ICD-10-CM | POA: Insufficient documentation

## 2014-06-04 DIAGNOSIS — Z5189 Encounter for other specified aftercare: Secondary | ICD-10-CM

## 2014-06-04 DIAGNOSIS — Z4800 Encounter for change or removal of nonsurgical wound dressing: Secondary | ICD-10-CM | POA: Insufficient documentation

## 2014-06-04 NOTE — ED Notes (Addendum)
Pt reports got hand stuck on a barb wire fence. One puncture site noted to inside of left hand. No active bleeding. Site pin point in size. nad noted. Mild redness noted around site.pt reports seen for same x2 days ago and reports was told to come back for re-eval.

## 2014-06-04 NOTE — Discharge Instructions (Signed)
Continue your antibiotics. Return if you notice increased swelling, redness or pain.

## 2014-06-04 NOTE — ED Notes (Signed)
Pt has small scab to thenar  region of lt hand from barb wire, Faint red streak leading from this area.  Pt feels it is improving.

## 2014-06-04 NOTE — ED Provider Notes (Signed)
CSN: [id number]     Arrival date & time 06/04/14  1253 History   None    Chief Complaint  Patient presents with  . Puncture Wound     (Consider location/radiation/quality/duration/timing/severity/associated sxs/prior Treatment) Patient is a 81 y.o. male presenting with wound check. The history is provided by the patient.  Wound Check The current episode started in the past 7 days.   Brian Austin is a 81 y.o. male who presents to the ED for wound recheck. He was evaluated here 4/17 and evaluated for puncture wound to the left hand from a barbwire fence. There was red streaking and he was started on antibiotics. Patient reports that the area has improve. He continues to take the antibiotics. He has his tetanus updated on the last visit.    Past Medical History  Diagnosis Date  . GERD (gastroesophageal reflux disease)   . Arthritis   . COPD (chronic obstructive pulmonary disease) 05/22/2008    FEV1/FVS 44.9  . Squamous cell carcinoma lung 2000    CT w/ CM; 04/25/2009 COPD, s/p RLL, CAD, 5 cm ascending aortic aneurysm  . Spontaneous pneumothorax   . BPH (benign prostatic hyperplasia)   . Hyperlipidemia   . Precordial pain   . Jaw pain   . CAD (coronary artery disease)     s/p Xience stent ot the distal RAC 2010, PCI with Xience stent to distal RCA at the [facility] 2011  . Hypertension   . Thoracic ascending aortic aneurysm     4.8 cm   . Diabetes mellitus     Well controlled  . Type II or unspecified type diabetes mellitus without mention of complication, not stated as uncontrolled    Past Surgical History  Procedure Laterality Date  . Lobectomy  2000    Right lower for squamous cell ca  . Needle removed from the right foot    . Lumbar laminectomy  1978  . Lymph node resected  1983    Axilla  . Spontaneous pneumothrax  1979  . Cardiac catheterization  2010    Stent  . Back surgery    . I&d extremity Left 11/10/2012    Procedure: IRRIGATION AND DEBRIDEMENT Left third-long  finger ;  Surgeon: Jolyn Nap, MD;  Location: [place];  Service: Orthopedics;  Laterality: Left;   Family History  Problem Relation Age of Onset  . Heart disease Neg Hx   . Diabetes Father   . Alcohol abuse Father    History  Substance Use Topics  . Smoking status: Former Smoker    Quit date: 02/16/1984  . Smokeless tobacco: Not on file     Comment: Smoked for 30 years  . Alcohol Use: Yes     Comment: wine with supper    Review of Systems Negative except as stated in HPI   Allergies  Review of patient's allergies indicates no known allergies.  Home Medications   Prior to Admission medications   Medication Sig Start Date End Date Taking? Authorizing Provider  albuterol (PROVENTIL HFA;VENTOLIN HFA) 108 (90 BASE) MCG/ACT inhaler Inhale 2 puffs into the lungs every 4 (four) hours as needed for wheezing or shortness of breath (or coughing). 05/22/80   Delora Fuel, MD  amoxicillin-clavulanate (AUGMENTIN) 875-125 MG per tablet Take 1 tablet by mouth 2 (two) times daily. 06/02/14   Charlann Lange, PA-C  Cholecalciferol ([employer]S VIT D 5000 HIGH-POTENCY PO) Take by mouth daily.    Historical Provider, MD  glucosamine-chondroitin 500-400 MG tablet Take 1  tablet by mouth 2 (two) times daily.     Historical Provider, MD  losartan (COZAAR) 25 MG tablet Take 1 tablet (25 mg total) by mouth daily. 09/10/13   Minus Breeding, MD  magnesium oxide (MAG-OX) 400 MG tablet Take 400 mg by mouth daily.    Historical Provider, MD  predniSONE (DELTASONE) 50 MG tablet Take 1 tablet (50 mg total) by mouth daily. 09/18/23   Delora Fuel, MD  Tamsulosin HCl (FLOMAX) 0.4 MG CAPS Take 0.4 mg by mouth daily.     Historical Provider, MD  vitamin B-12 (CYANOCOBALAMIN) 100 MCG tablet Take 50 mcg by mouth daily.    Historical Provider, MD   BP 153/63 mmHg  Pulse 76  Temp(Src) 98.1 F (36.7 C) (Oral)  Resp 18  Ht '5\' 7"'$  (1.702 m)  Wt 154 lb (69.854 kg)  BMI 24.11 kg/m2  SpO2 97% Physical Exam  Constitutional: He is  oriented to person, place, and time. He appears well-developed and well-nourished.  HENT:  Head: Normocephalic.  Eyes: EOM are normal.  Neck: Neck supple.  Cardiovascular: Normal rate.   Pulmonary/Chest: Effort normal.  Abdominal: Soft. There is no tenderness.  Musculoskeletal: Normal range of motion.       Left hand: He exhibits normal range of motion, no tenderness, normal capillary refill and no swelling. Normal sensation noted. Normal strength noted.       Hands: Healing puncture wound to palmar aspect of the left hand. Faint red steak to forearm. Improved since previous visit.   Neurological: He is alert and oriented to person, place, and time. No cranial nerve deficit.  Skin: Skin is warm and dry.  Psychiatric: He has a normal mood and affect. His behavior is normal.  Nursing note and vitals reviewed.   ED Course  Procedures (  MDM  Dr. Venora Maples in to see the patient  81 y.o. male with healing wound to the left hand. Improving infection. He will continue his antibiotics. He will return for worsening symptoms.   Final diagnoses:  Visit for wound check      [facility], NP 06/04/14 [place], MD 06/04/14 [phone]

## 2014-06-08 ENCOUNTER — Emergency Department (HOSPITAL_COMMUNITY)
Admission: EM | Admit: 2014-06-08 | Discharge: 2014-06-08 | Disposition: A | Discharge status: Home / Self Care | Payer: Medicare Other | Attending: Emergency Medicine | Admitting: Emergency Medicine

## 2014-06-08 ENCOUNTER — Encounter (HOSPITAL_COMMUNITY): Payer: Self-pay | Admitting: *Deleted

## 2014-06-08 DIAGNOSIS — I2510 Atherosclerotic heart disease of native coronary artery without angina pectoris: Secondary | ICD-10-CM | POA: Diagnosis not present

## 2014-06-08 DIAGNOSIS — Z8739 Personal history of other diseases of the musculoskeletal system and connective tissue: Secondary | ICD-10-CM | POA: Diagnosis not present

## 2014-06-08 DIAGNOSIS — Z87891 Personal history of nicotine dependence: Secondary | ICD-10-CM | POA: Insufficient documentation

## 2014-06-08 DIAGNOSIS — Y288XXD Contact with other sharp object, undetermined intent, subsequent encounter: Secondary | ICD-10-CM | POA: Diagnosis not present

## 2014-06-08 DIAGNOSIS — Z87448 Personal history of other diseases of urinary system: Secondary | ICD-10-CM | POA: Insufficient documentation

## 2014-06-08 DIAGNOSIS — J449 Chronic obstructive pulmonary disease, unspecified: Secondary | ICD-10-CM | POA: Insufficient documentation

## 2014-06-08 DIAGNOSIS — Z79899 Other long term (current) drug therapy: Secondary | ICD-10-CM | POA: Diagnosis not present

## 2014-06-08 DIAGNOSIS — S61432D Puncture wound without foreign body of left hand, subsequent encounter: Secondary | ICD-10-CM | POA: Diagnosis not present

## 2014-06-08 DIAGNOSIS — E119 Type 2 diabetes mellitus without complications: Secondary | ICD-10-CM | POA: Diagnosis not present

## 2014-06-08 DIAGNOSIS — I10 Essential (primary) hypertension: Secondary | ICD-10-CM | POA: Diagnosis not present

## 2014-06-08 DIAGNOSIS — Z8719 Personal history of other diseases of the digestive system: Secondary | ICD-10-CM | POA: Diagnosis not present

## 2014-06-08 DIAGNOSIS — Z85828 Personal history of other malignant neoplasm of skin: Secondary | ICD-10-CM | POA: Diagnosis not present

## 2014-06-08 DIAGNOSIS — Z792 Long term (current) use of antibiotics: Secondary | ICD-10-CM | POA: Insufficient documentation

## 2014-06-08 NOTE — ED Notes (Signed)
Pt states he has a rash on his arm and chest and a headache. Pt also concerned about red streak on his left forearm from a previous wound.

## 2014-06-08 NOTE — ED Provider Notes (Signed)
CSN: [id number]     Arrival date & time 06/08/14  2015 History   First MD Initiated Contact with Patient 06/08/14 2037     No chief complaint on file.    (Consider location/radiation/quality/duration/timing/severity/associated sxs/prior Treatment) HPI.... Patient seen on 06/02/14 status post puncture wound from barbed wire on left thenar eminence. Augmentin was started. He was rechecked on 06/04/14 with improvement of symptoms. Tonight patient is concerned about a rash associated with his antibiotic. No fever, chills. No respiratory distress.  Past Medical History  Diagnosis Date  . GERD (gastroesophageal reflux disease)   . Arthritis   . COPD (chronic obstructive pulmonary disease) 05/22/2008    FEV1/FVS 44.9  . Squamous cell carcinoma lung 2000    CT w/ CM; 04/25/2009 COPD, s/p RLL, CAD, 5 cm ascending aortic aneurysm  . Spontaneous pneumothorax   . BPH (benign prostatic hyperplasia)   . Hyperlipidemia   . Precordial pain   . Jaw pain   . CAD (coronary artery disease)     s/p Xience stent ot the distal RAC 2010, PCI with Xience stent to distal RCA at the [facility] 2011  . Hypertension   . Thoracic ascending aortic aneurysm     4.8 cm   . Diabetes mellitus     Well controlled  . Type II or unspecified type diabetes mellitus without mention of complication, not stated as uncontrolled    Past Surgical History  Procedure Laterality Date  . Lobectomy  2000    Right lower for squamous cell ca  . Needle removed from the right foot    . Lumbar laminectomy  1978  . Lymph node resected  1983    Axilla  . Spontaneous pneumothrax  1979  . Cardiac catheterization  2010    Stent  . Back surgery    . I&d extremity Left 11/10/2012    Procedure: IRRIGATION AND DEBRIDEMENT Left third-long finger ;  Surgeon: Jolyn Nap, MD;  Location: [place];  Service: Orthopedics;  Laterality: Left;   Family History  Problem Relation Age of Onset  . Heart disease Neg Hx   . Diabetes Father   . Alcohol  abuse Father    History  Substance Use Topics  . Smoking status: Former Smoker    Quit date: 02/16/1984  . Smokeless tobacco: Not on file     Comment: Smoked for 30 years  . Alcohol Use: Yes     Comment: wine with supper    Review of Systems  All other systems reviewed and are negative.     Allergies  Review of patient's allergies indicates no known allergies.  Home Medications   Prior to Admission medications   Medication Sig Start Date End Date Taking? Authorizing Provider  amoxicillin-clavulanate (AUGMENTIN) 875-125 MG per tablet Take 1 tablet by mouth 2 (two) times daily. 06/02/14  Yes Charlann Lange, PA-C  glucosamine-chondroitin 500-400 MG tablet Take 1 tablet by mouth 2 (two) times daily.    Yes Historical Provider, MD  losartan (COZAAR) 25 MG tablet Take 1 tablet (25 mg total) by mouth daily. 09/10/13  Yes Minus Breeding, MD  magnesium oxide (MAG-OX) 400 MG tablet Take 400 mg by mouth daily.   Yes Historical Provider, MD  Tamsulosin HCl (FLOMAX) 0.4 MG CAPS Take 0.4 mg by mouth daily.    Yes Historical Provider, MD  vitamin B-12 (CYANOCOBALAMIN) 100 MCG tablet Take 50 mcg by mouth daily.   Yes Historical Provider, MD  albuterol (PROVENTIL HFA;VENTOLIN HFA) 108 (90 BASE) MCG/ACT  inhaler Inhale 2 puffs into the lungs every 4 (four) hours as needed for wheezing or shortness of breath (or coughing). 04/20/98   Delora Fuel, MD  Cholecalciferol ([employer]S VIT D 5000 HIGH-POTENCY PO) Take by mouth daily.    Historical Provider, MD  predniSONE (DELTASONE) 50 MG tablet Take 1 tablet (50 mg total) by mouth daily. Patient not taking: Reported on 06/08/2014 10/19/79   Delora Fuel, MD   BP 829/93 mmHg  Pulse 74  Temp(Src) 97.8 F (36.6 C) (Oral)  Resp 19  Ht '5\' 7"'$  (1.702 m)  Wt 155 lb (70.308 kg)  BMI 24.27 kg/m2  SpO2 95% Physical Exam  Constitutional: He is oriented to person, place, and time. He appears well-developed and well-nourished.  HENT:  Head: Normocephalic and atraumatic.   Musculoskeletal:  Left hand:  Healing puncture site on left thenar eminence. No cellulitis  Neurological: He is alert and oriented to person, place, and time.  Skin:  No evidence of a drug rash.  Psychiatric: He has a normal mood and affect. His behavior is normal.    ED Course  Procedures (including critical care time) Labs Review Labs Reviewed - No data to display  Imaging Review No results found.   EKG Interpretation None      MDM   Final diagnoses:  Puncture wound of left hand, subsequent encounter      Patient appears stable. Can discharge.  Nat Christen, MD 06/08/14 2108

## 2014-06-08 NOTE — Discharge Instructions (Signed)
I do not see any evidence of a rash associated with antibiotic.

## 2014-06-08 NOTE — ED Notes (Signed)
Pt left ED , ambulatory with no signs of distress. Pt verbalized understanding of discharge instructions.

## 2014-06-13 ENCOUNTER — Ambulatory Visit (INDEPENDENT_AMBULATORY_CARE_PROVIDER_SITE_OTHER): Payer: Medicare Other

## 2014-06-13 ENCOUNTER — Ambulatory Visit (INDEPENDENT_AMBULATORY_CARE_PROVIDER_SITE_OTHER): Payer: Medicare Other | Admitting: Cardiology

## 2014-06-13 ENCOUNTER — Encounter: Payer: Self-pay | Admitting: Cardiology

## 2014-06-13 ENCOUNTER — Encounter: Payer: Self-pay | Admitting: Radiology

## 2014-06-13 VITALS — BP 128/69 | HR 66 | Ht 66.0 in | Wt 159.1 lb

## 2014-06-13 DIAGNOSIS — I259 Chronic ischemic heart disease, unspecified: Secondary | ICD-10-CM | POA: Diagnosis not present

## 2014-06-13 DIAGNOSIS — R002 Palpitations: Secondary | ICD-10-CM

## 2014-06-13 DIAGNOSIS — I2510 Atherosclerotic heart disease of native coronary artery without angina pectoris: Secondary | ICD-10-CM

## 2014-06-13 DIAGNOSIS — E785 Hyperlipidemia, unspecified: Secondary | ICD-10-CM

## 2014-06-13 DIAGNOSIS — Z79899 Other long term (current) drug therapy: Secondary | ICD-10-CM

## 2014-06-13 DIAGNOSIS — I10 Essential (primary) hypertension: Secondary | ICD-10-CM | POA: Diagnosis not present

## 2014-06-13 NOTE — Progress Notes (Signed)
HPI The patient presents for followup of his known coronary disease. He presents for evaluation of dizziness.  This has been going on for several months.  He describes a strong heart beat right before and after he wakes up.  He feels like "every nerve in my body is tingling".  The patient denies any new symptoms such as chest discomfort, neck or arm discomfort. There has been no new shortness of breath, PND or orthopnea. There have been no reported presyncope or syncope.  He denies orthostatic symptoms.  He has not had a rapid or irregular rhythm.  This is happening 3 - 4 x per week for up to 30 minutes and comes and goes on its own.  He is very active and cannot make it happen.    No Known Allergies  Current Outpatient Prescriptions  Medication Sig Dispense Refill  . glucosamine-chondroitin 500-400 MG tablet Take 1 tablet by mouth 2 (two) times daily.     Marland Kitchen losartan (COZAAR) 25 MG tablet Take 1 tablet (25 mg total) by mouth daily. 90 tablet 3  . magnesium oxide (MAG-OX) 400 MG tablet Take 400 mg by mouth daily.    . Tamsulosin HCl (FLOMAX) 0.4 MG CAPS Take 0.4 mg by mouth daily.      No current facility-administered medications for this visit.    Past Medical History  Diagnosis Date  . GERD (gastroesophageal reflux disease)   . Arthritis   . COPD (chronic obstructive pulmonary disease) 05/22/2008    FEV1/FVS 44.9  . Squamous cell carcinoma lung 2000    CT w/ CM; 04/25/2009 COPD, s/p RLL, CAD, 5 cm ascending aortic aneurysm  . Spontaneous pneumothorax   . BPH (benign prostatic hyperplasia)   . Hyperlipidemia   . CAD (coronary artery disease)     s/p Xience stent ot the distal RAC 2010, PCI with Xience stent to distal RCA at the [facility] 2011  . Hypertension   . Thoracic ascending aortic aneurysm     4.8 cm   . Diabetes mellitus     Well controlled  . Type II or unspecified type diabetes mellitus without mention of complication, not stated as uncontrolled     Past Surgical History    Procedure Laterality Date  . Lobectomy  2000    Right lower for squamous cell ca  . Needle removed from the right foot    . Lumbar laminectomy  1978  . Lymph node resected  1983    Axilla  . Spontaneous pneumothrax  1979  . Cardiac catheterization  2010    Stent  . Back surgery    . I&d extremity Left 11/10/2012    Procedure: IRRIGATION AND DEBRIDEMENT Left third-long finger ;  Surgeon: Jolyn Nap, MD;  Location: [place];  Service: Orthopedics;  Laterality: Left;    ROS:  As stated in the HPI and negative for all other systems.  PHYSICAL EXAM Pulse 66  Ht '5\' 6"'$  (1.676 m)  Wt 159 lb 1.6 oz (72.167 kg)  BMI 25.69 kg/m2 GENERAL:  Well appearing HEENT:  Pupils equal round and reactive, fundi not visualized, oral mucosa unremarkable, dentures NECK:  No jugular venous distention, waveform within normal limits, carotid upstroke brisk and symmetric, no bruits, no thyromegaly LYMPHATICS:  No cervical, inguinal adenopathy LUNGS:  Scattered bilateral expiratory wheezes BACK:  No CVA tenderness CHEST:  Unremarkable HEART:  PMI not displaced or sustained,S1 and S2 within normal limits, no S3, no S4, no clicks, no rubs, no murmurs ABD:  Flat, positive bowel sounds normal in frequency in pitch, no bruits, no rebound, no guarding, no midline pulsatile mass, no hepatomegaly, no splenomegaly EXT:  2 plus pulses throughout, no edema, no cyanosis no clubbing   EKG: Sinus rhythm, rate, axis within normal limits 66, RBBB, no acute ST-T wave changes.  06/13/2014   ASSESSMENT AND PLAN  PALPITATIONS:  He is orthostatic.  However, I don't think that this is related.  I will start with TSH and 48 hour Holter   CAD:  The patient has no new sypmtoms.  No further cardiovascular testing is indicated.  We will continue with aggressive risk reduction and meds as listed.    HTN:  He will continue current meds as listed.   HYPERLIPIDEMIA:    He needs a fasting lipid profile.    BRUIT:   He had no  stenosis.  No further imaging is indicated.   AORTIC ANEURYSM:  This is stable at 4.8 cm X 4.5 in Oct 2014. This is followed by Dr. Roxan Hockey.

## 2014-06-13 NOTE — Progress Notes (Signed)
Patient ID: Brian Austin, male   DOB: Mar 03, 1930, 81 y.o.   MRN: [id number] [facility] 48 hr holter applied.

## 2014-06-13 NOTE — Patient Instructions (Addendum)
Your physician recommends that you schedule a follow-up appointment in: 1 month  Your physician has recommended that you wear a holter monitor. Holter monitors are medical devices that record the heart's electrical activity. Doctors most often use these monitors to diagnose arrhythmias. Arrhythmias are problems with the speed or rhythm of the heartbeat. The monitor is a small, portable device. You can wear one while you do your normal daily activities. This is usually used to diagnose what is causing palpitations/syncope (passing out). This should be scheduled for placement at our [place] recommends that you return for lab work FASTING

## 2014-06-14 ENCOUNTER — Other Ambulatory Visit (INDEPENDENT_AMBULATORY_CARE_PROVIDER_SITE_OTHER): Payer: Medicare Other | Admitting: *Deleted

## 2014-06-14 DIAGNOSIS — E785 Hyperlipidemia, unspecified: Secondary | ICD-10-CM

## 2014-06-14 DIAGNOSIS — R002 Palpitations: Secondary | ICD-10-CM | POA: Diagnosis not present

## 2014-06-14 LAB — LIPID PANEL
Cholesterol: 138 mg/dL (ref 0–200)
HDL: 40.2 mg/dL (ref >39.00)
LDL Cholesterol: 87 mg/dL (ref 0–99)
NONHDL: 97.8
Total CHOL/HDL Ratio: 3
Triglycerides: 53 mg/dL (ref 0.0–149.0)
VLDL: 10.6 mg/dL (ref 0.0–40.0)

## 2014-06-14 LAB — TSH: TSH: 1.57 u[IU]/mL (ref 0.35–4.50)

## 2014-06-14 NOTE — Addendum Note (Signed)
Addended by: Eulis Foster on: 06/14/2014 08:19 AM   Modules accepted: Orders

## 2014-07-28 ENCOUNTER — Other Ambulatory Visit: Payer: Self-pay | Admitting: Cardiology

## 2014-07-29 NOTE — Telephone Encounter (Signed)
refill 

## 2014-07-31 ENCOUNTER — Ambulatory Visit (INDEPENDENT_AMBULATORY_CARE_PROVIDER_SITE_OTHER): Payer: Medicare Other | Admitting: Cardiology

## 2014-07-31 ENCOUNTER — Encounter: Payer: Self-pay | Admitting: Cardiology

## 2014-07-31 VITALS — BP 124/54 | HR 62 | Ht 67.0 in | Wt 161.0 lb

## 2014-07-31 DIAGNOSIS — I259 Chronic ischemic heart disease, unspecified: Secondary | ICD-10-CM | POA: Diagnosis not present

## 2014-07-31 DIAGNOSIS — I2510 Atherosclerotic heart disease of native coronary artery without angina pectoris: Secondary | ICD-10-CM | POA: Diagnosis not present

## 2014-07-31 NOTE — Progress Notes (Signed)
HPI The patient presents for followup of his known coronary disease and  of dizziness.   He was complaining of dizziness and a tingling sensation when I saw him previously. I applied a Holter and he had some PVCs. However, since I last saw him he's had fewer of these episodes. He's not having palpitations like he was. He's not having the whole over tingling sensation.   He thinks this might be because he stopped taking his shot of "white liquor" every night.  He remains busy.  The patient denies any new symptoms such as chest discomfort, neck or arm discomfort. There has been no new shortness of breath, PND or orthopnea. There have been no reported palpitations, presyncope or syncope..  No Known Allergies  Current Outpatient Prescriptions  Medication Sig Dispense Refill  . glucosamine-chondroitin 500-400 MG tablet Take 1 tablet by mouth 2 (two) times daily.     Marland Kitchen losartan (COZAAR) 25 MG tablet TAKE 1 TABLET DAILY 90 tablet 0  . magnesium oxide (MAG-OX) 400 MG tablet Take 400 mg by mouth daily.    . Tamsulosin HCl (FLOMAX) 0.4 MG CAPS Take 0.4 mg by mouth daily.      No current facility-administered medications for this visit.    Past Medical History  Diagnosis Date  . GERD (gastroesophageal reflux disease)   . Arthritis   . COPD (chronic obstructive pulmonary disease) 05/22/2008    FEV1/FVS 44.9  . Squamous cell carcinoma lung 2000    CT w/ CM; 04/25/2009 COPD, s/p RLL, CAD, 5 cm ascending aortic aneurysm  . Spontaneous pneumothorax   . BPH (benign prostatic hyperplasia)   . Hyperlipidemia   . CAD (coronary artery disease)     s/p Xience stent ot the distal RAC 2010, PCI with Xience stent to distal RCA at the [facility] 2011  . Hypertension   . Thoracic ascending aortic aneurysm     4.8 cm   . Diabetes mellitus     Well controlled  . Type II or unspecified type diabetes mellitus without mention of complication, not stated as uncontrolled     Past Surgical History  Procedure  Laterality Date  . Lobectomy  2000    Right lower for squamous cell ca  . Needle removed from the right foot    . Lumbar laminectomy  1978  . Lymph node resected  1983    Axilla  . Spontaneous pneumothrax  1979  . Cardiac catheterization  2010    Stent  . Back surgery    . I&d extremity Left 11/10/2012    Procedure: IRRIGATION AND DEBRIDEMENT Left third-long finger ;  Surgeon: Jolyn Nap, MD;  Location: [facility];  Service: Orthopedics;  Laterality: Left;    ROS:  As stated in the HPI and negative for all other systems.  PHYSICAL EXAM BP 124/54 mmHg  Pulse 62  Ht '5\' 7"'$  (1.702 m)  Wt 161 lb (73.029 kg)  BMI 25.21 kg/m2 GENERAL:  Well appearing HEENT:  Pupils equal round and reactive, fundi not visualized, oral mucosa unremarkable, dentures NECK:  No jugular venous distention, waveform within normal limits, carotid upstroke brisk and symmetric, no bruits, no thyromegaly LYMPHATICS:  No cervical, inguinal adenopathy LUNGS:  Scattered bilateral expiratory wheezes BACK:  No CVA tenderness CHEST:  Unremarkable HEART:  PMI not displaced or sustained,S1 and S2 within normal limits, no S3, no S4, no clicks, no rubs, no murmurs ABD:  Flat, positive bowel sounds normal in frequency in pitch, no bruits, no rebound,  no guarding, no midline pulsatile mass, no hepatomegaly, no splenomegaly EXT:  2 plus pulses throughout, no edema, no cyanosis no clubbing    ASSESSMENT AND PLAN  PALPITATIONS:  Holter demonstrated no significant arrhythmias. He's not having any significant symptoms. No further testing is suggested.  CAD:  The patient has no new sypmtoms.  No further cardiovascular testing is indicated.  We will continue with aggressive risk reduction and meds as listed.    HTN:  He will continue current meds as listed.   AORTIC ANEURYSM:  This is followed by Dr. Roxan Hockey.  DYSLIPIDEMIA:  His LDL was 87 in April.  No change in therapy is indicated.

## 2014-10-27 ENCOUNTER — Other Ambulatory Visit: Payer: Self-pay | Admitting: Cardiology

## 2014-12-11 ENCOUNTER — Other Ambulatory Visit: Payer: Self-pay | Admitting: *Deleted

## 2014-12-11 DIAGNOSIS — I7121 Aneurysm of the ascending aorta, without rupture: Secondary | ICD-10-CM

## 2014-12-11 DIAGNOSIS — I712 Thoracic aortic aneurysm, without rupture: Secondary | ICD-10-CM

## 2014-12-28 LAB — CREATININE, SERUM: CREATININE: 0.74 mg/dL (ref 0.70–1.11)

## 2014-12-31 ENCOUNTER — Ambulatory Visit
Admission: RE | Admit: 2014-12-31 | Discharge: 2014-12-31 | Disposition: A | Discharge status: Home / Self Care | Payer: Medicare Other | Source: Ambulatory Visit | Attending: Thoracic Surgery (Cardiothoracic Vascular Surgery) | Admitting: Thoracic Surgery (Cardiothoracic Vascular Surgery)

## 2014-12-31 ENCOUNTER — Ambulatory Visit (INDEPENDENT_AMBULATORY_CARE_PROVIDER_SITE_OTHER): Payer: Medicare Other | Admitting: Thoracic Surgery (Cardiothoracic Vascular Surgery)

## 2014-12-31 ENCOUNTER — Encounter: Payer: Self-pay | Admitting: Thoracic Surgery (Cardiothoracic Vascular Surgery)

## 2014-12-31 VITALS — BP 144/66 | HR 70 | Resp 20 | Ht 67.0 in | Wt 150.0 lb

## 2014-12-31 DIAGNOSIS — I7121 Aneurysm of the ascending aorta, without rupture: Secondary | ICD-10-CM

## 2014-12-31 DIAGNOSIS — I712 Thoracic aortic aneurysm, without rupture: Secondary | ICD-10-CM | POA: Diagnosis not present

## 2014-12-31 DIAGNOSIS — I259 Chronic ischemic heart disease, unspecified: Secondary | ICD-10-CM

## 2014-12-31 MED ORDER — IOPAMIDOL (ISOVUE-370) INJECTION 76%
75.0000 mL | Freq: Once | INTRAVENOUS | Status: AC | PRN
  Administered 2014-12-31: 75 mL via INTRAVENOUS

## 2014-12-31 NOTE — Progress Notes (Signed)
[address]       [place] [address]             [phone]       HPI: [name] returns today for follow up of his ascending aneurysm.  He is an 81 yo man first noted to have an ascending aneurysm in 2010 when it was noted incidentally on a CT of the chest. We have been following him with annual CT scans since then.  He has been feeling well. He saw Dr. Percival Spanish in June. 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 He continues to have occasional heartburn. This is more associated with eating and exertion. He has not had any shortness of breath, orthopnea, PND, or peripheral edema. His exercise tolerance is unchanged. He does note that he seems to bruise more easily than when he was younger.  Past Medical History  Diagnosis Date  . GERD (gastroesophageal reflux disease)   . Arthritis   . COPD (chronic obstructive pulmonary disease) ([place]) 05/22/2008    FEV1/FVS 44.9  . Squamous cell carcinoma lung ([place]) 2000    CT w/ CM; 04/25/2009 COPD, s/p RLL, CAD, 5 cm ascending aortic aneurysm  . Spontaneous pneumothorax   . BPH (benign prostatic hyperplasia)   . Hyperlipidemia   . CAD (coronary artery disease)     s/p Xience stent ot the distal RAC 2010, PCI with Xience stent to distal RCA at the [facility] 2011  . Hypertension   . Thoracic ascending aortic aneurysm (HCC)     4.8 cm   . Diabetes mellitus     Well controlled  . Type II or unspecified type diabetes mellitus without mention of complication, not stated as uncontrolled       Current Outpatient Prescriptions  Medication Sig Dispense Refill  . glucosamine-chondroitin 500-400 MG tablet Take 1 tablet by mouth 2 (two) times daily.     Marland Kitchen losartan (COZAAR) 25 MG tablet TAKE 1 TABLET DAILY 90 tablet 3  . magnesium oxide (MAG-OX) 400 MG tablet Take 400 mg by mouth daily.    . Tamsulosin HCl (FLOMAX) 0.4 MG CAPS Take 0.4 mg by mouth daily.      No current facility-administered medications for  this visit.    Physical Exam BP 144/66 mmHg  Pulse 70  Resp 20  Ht '5\' 7"'$  (1.702 m)  Wt 150 lb (68.04 kg)  BMI 23.49 kg/m2  SpO71 87% 81 year old man in no acute distress Extremely hard of hearing Alert and oriented 3 with no focal motor deficits Cardiac regular rate and rhythm with a 2/6 systolic murmur Diminished breath sounds bilaterally, faint wheezes right base  Diagnostic Tests: I personally reviewed his CT angiogram of the chest. It shows no change in the 4.8 cm ascending aneurysm.  Impression: [name] is an 81 year old man with a 4.8 cm ascending aneurysm. This has been stable over time.  His blood pressure was mildly elevated today. This may in part be due to having the scan and then coming for his visit. His blood pressure was normal back in June. I did advise him to have his blood pressure checked on a regular basis and if it remains elevated he may need to add an additional medication.  Plan:  Return in one year with CT angiogram of chest.  I spent 10 minutes with Brian Austin during this visit. Brian Nakayama, MD [facility] [phone]

## 2015-01-12 ENCOUNTER — Emergency Department (HOSPITAL_COMMUNITY)
Admission: EM | Admit: 2015-01-12 | Discharge: 2015-01-12 | Disposition: A | Discharge status: Home / Self Care | Payer: Medicare Other | Attending: Emergency Medicine | Admitting: Emergency Medicine

## 2015-01-12 ENCOUNTER — Encounter (HOSPITAL_COMMUNITY): Payer: Self-pay | Admitting: *Deleted

## 2015-01-12 DIAGNOSIS — W540XXA Bitten by dog, initial encounter: Secondary | ICD-10-CM | POA: Insufficient documentation

## 2015-01-12 DIAGNOSIS — L089 Local infection of the skin and subcutaneous tissue, unspecified: Secondary | ICD-10-CM | POA: Diagnosis not present

## 2015-01-12 DIAGNOSIS — I2510 Atherosclerotic heart disease of native coronary artery without angina pectoris: Secondary | ICD-10-CM | POA: Diagnosis not present

## 2015-01-12 DIAGNOSIS — J441 Chronic obstructive pulmonary disease with (acute) exacerbation: Secondary | ICD-10-CM | POA: Diagnosis not present

## 2015-01-12 DIAGNOSIS — Z8739 Personal history of other diseases of the musculoskeletal system and connective tissue: Secondary | ICD-10-CM | POA: Diagnosis not present

## 2015-01-12 DIAGNOSIS — S61452A Open bite of left hand, initial encounter: Secondary | ICD-10-CM | POA: Insufficient documentation

## 2015-01-12 DIAGNOSIS — N400 Enlarged prostate without lower urinary tract symptoms: Secondary | ICD-10-CM | POA: Insufficient documentation

## 2015-01-12 DIAGNOSIS — I10 Essential (primary) hypertension: Secondary | ICD-10-CM | POA: Insufficient documentation

## 2015-01-12 DIAGNOSIS — Y998 Other external cause status: Secondary | ICD-10-CM | POA: Insufficient documentation

## 2015-01-12 DIAGNOSIS — K219 Gastro-esophageal reflux disease without esophagitis: Secondary | ICD-10-CM | POA: Insufficient documentation

## 2015-01-12 DIAGNOSIS — Z9889 Other specified postprocedural states: Secondary | ICD-10-CM | POA: Diagnosis not present

## 2015-01-12 DIAGNOSIS — Z79899 Other long term (current) drug therapy: Secondary | ICD-10-CM | POA: Diagnosis not present

## 2015-01-12 DIAGNOSIS — E119 Type 2 diabetes mellitus without complications: Secondary | ICD-10-CM | POA: Diagnosis not present

## 2015-01-12 DIAGNOSIS — Z87891 Personal history of nicotine dependence: Secondary | ICD-10-CM | POA: Diagnosis not present

## 2015-01-12 DIAGNOSIS — Z85118 Personal history of other malignant neoplasm of bronchus and lung: Secondary | ICD-10-CM | POA: Insufficient documentation

## 2015-01-12 DIAGNOSIS — Y9289 Other specified places as the place of occurrence of the external cause: Secondary | ICD-10-CM | POA: Insufficient documentation

## 2015-01-12 DIAGNOSIS — Y9389 Activity, other specified: Secondary | ICD-10-CM | POA: Diagnosis not present

## 2015-01-12 LAB — COMPREHENSIVE METABOLIC PANEL
ALBUMIN: 4.8 g/dL (ref 3.5–5.0)
ALT: 20 U/L (ref 17–63)
ANION GAP: 9 (ref 5–15)
AST: 20 U/L (ref 15–41)
Alkaline Phosphatase: 56 U/L (ref 38–126)
BILIRUBIN TOTAL: 0.9 mg/dL (ref 0.3–1.2)
BUN: 17 mg/dL (ref 6–20)
CO2: 28 mmol/L (ref 22–32)
Calcium: 10.2 mg/dL (ref 8.9–10.3)
Chloride: 101 mmol/L (ref 101–111)
Creatinine, Ser: 0.76 mg/dL (ref 0.61–1.24)
GFR calc Af Amer: >60 mL/min (ref >60)
GFR calc non Af Amer: >60 mL/min (ref >60)
GLUCOSE: 99 mg/dL (ref 65–99)
POTASSIUM: 4.4 mmol/L (ref 3.5–5.1)
SODIUM: 138 mmol/L (ref 135–145)
TOTAL PROTEIN: 8 g/dL (ref 6.5–8.1)

## 2015-01-12 LAB — CBC WITH DIFFERENTIAL/PLATELET
BASOS ABS: 0 10*3/uL (ref 0.0–0.1)
Basophils Relative: 0 %
Eosinophils Absolute: 0.1 10*3/uL (ref 0.0–0.7)
Eosinophils Relative: 1 %
HEMATOCRIT: 46.8 % (ref 39.0–52.0)
HEMOGLOBIN: 15.9 g/dL (ref 13.0–17.0)
LYMPHS PCT: 22 %
Lymphs Abs: 1.9 10*3/uL (ref 0.7–4.0)
MCH: 30.6 pg (ref 26.0–34.0)
MCHC: 34 g/dL (ref 30.0–36.0)
MCV: 90 fL (ref 78.0–100.0)
MONO ABS: 0.8 10*3/uL (ref 0.1–1.0)
Monocytes Relative: 10 %
NEUTROS ABS: 5.7 10*3/uL (ref 1.7–7.7)
NEUTROS PCT: 67 %
Platelets: 192 10*3/uL (ref 150–400)
RBC: 5.2 MIL/uL (ref 4.22–5.81)
RDW: 13.5 % (ref 11.5–15.5)
WBC: 8.5 10*3/uL (ref 4.0–10.5)

## 2015-01-12 MED ORDER — AMOXICILLIN-POT CLAVULANATE 875-125 MG PO TABS
1.0000 | ORAL_TABLET | Freq: Once | ORAL | Status: AC
  Administered 2015-01-12: 1 via ORAL
  Filled 2015-01-12: qty 1

## 2015-01-12 MED ORDER — IBUPROFEN 400 MG PO TABS
400.0000 mg | ORAL_TABLET | Freq: Once | ORAL | Status: AC
  Administered 2015-01-12: 400 mg via ORAL
  Filled 2015-01-12: qty 1

## 2015-01-12 MED ORDER — AMOXICILLIN-POT CLAVULANATE 875-125 MG PO TABS: 1.0000 | ORAL_TABLET | Freq: Two times a day (BID) | ORAL | Status: DC

## 2015-01-12 MED ORDER — ONDANSETRON HCL 4 MG PO TABS
4.0000 mg | ORAL_TABLET | Freq: Once | ORAL | Status: AC
  Administered 2015-01-12: 4 mg via ORAL
  Filled 2015-01-12: qty 1

## 2015-01-12 NOTE — Discharge Instructions (Signed)
augmentin 2 times daily with a meal. Cleanse the wound with soap and water or betadine. See your MD or return to the Emergency Dept if any changes or problem. Animal Bite Animal bite wounds can get infected. It is important to get proper medical treatment. Ask your doctor if you need rabies treatment. HOME CARE  Wound Care  Follow instructions from your doctor about how to take care of your wound. Make sure you:  Wash your hands with soap and water before you change your bandage (dressing). If you cannot use soap and water, use hand sanitizer.  Change your bandage as told by your doctor.  Leave stitches (sutures), skin glue, or skin tape (adhesive) strips in place. They may need to stay in place for 2 weeks or longer. If tape strips get loose and curl up, you may trim the loose edges. Do not remove tape strips completely unless your doctor says it is okay.  Check your wound every day for signs of infection. Watch for:    Redness, swelling, or pain that gets worse.    Fluid, blood, or pus.  General Instructions  Take or apply over-the-counter and prescription medicines only as told by your doctor.   If you were prescribed an antibiotic, take or apply it as told by your doctor. Do not stop using the antibiotic even if your condition improves.   Keep the injured area raised (elevated) above the level of your heart while you are sitting or lying down.  If directed, apply ice to the injured area.    Put ice in a plastic bag.    Place a towel between your skin and the bag.    Leave the ice on for 20 minutes, 2-3 times per day.   Keep all follow-up visits as told by your doctor. This is important.  GET HELP IF:  You have redness, swelling, or pain that gets worse.   You have a general feeling of sickness (malaise).   You feel sick to your stomach (nauseous).  You throw up (vomit).   You have pain that does not get better.  GET HELP RIGHT AWAY IF:   You  have a red streak going away from your wound.   You have fluid, blood, or pus coming from your wound.   You have a fever or chills.   You have trouble moving your injured area.   You have numbness or tingling anywhere on your body.    This information is not intended to replace advice given to you by your health care provider. Make sure you discuss any questions you have with your health care provider.   Document Released: 02/01/2005 Document Revised: 10/23/2014 Document Reviewed: 06/19/2014 Elsevier Interactive Patient Education [employer].

## 2015-01-12 NOTE — ED Notes (Signed)
Pt states his dog scratched his left hand and he feels like it is infected and the pt states he sees a red streak going up his arm. Pt states this has happened before. Pt also c/o nausea.

## 2015-01-12 NOTE — ED Provider Notes (Signed)
CSN: [id number]     Arrival date & time 01/12/15  1907 History   None    Chief Complaint  Patient presents with  . Wound Infection     (Consider location/radiation/quality/duration/timing/severity/associated sxs/prior Treatment) HPI Comments: Patient is an 81 year old male presents to the emergency department with a complaint of dog bite/scratch to the left hand. The patient states that he sustained a scratch from his dog on his left hand approximately a week ago. He says that he is been cleaning it with peroxide and water. He now thinks that he has an area of infection, and questions whether or not there is a red streak going up his arm. The patient denies any high fever. He's not had nausea vomiting. There's been no unusual rash appreciated. The patient states that his last tetanus shot was less than a year ago. He states that the dog is his own dog and is up-to-date on all shots and immunizations.  The history is provided by the patient.    Past Medical History  Diagnosis Date  . GERD (gastroesophageal reflux disease)   . Arthritis   . COPD (chronic obstructive pulmonary disease) ([place]) 05/22/2008    FEV1/FVS 44.9  . Squamous cell carcinoma lung ([place]) 2000    CT w/ CM; 04/25/2009 COPD, s/p RLL, CAD, 5 cm ascending aortic aneurysm  . Spontaneous pneumothorax   . BPH (benign prostatic hyperplasia)   . Hyperlipidemia   . CAD (coronary artery disease)     s/p Xience stent ot the distal RAC 2010, PCI with Xience stent to distal RCA at the [facility] 2011  . Hypertension   . Thoracic ascending aortic aneurysm (HCC)     4.8 cm   . Diabetes mellitus     Well controlled  . Type II or unspecified type diabetes mellitus without mention of complication, not stated as uncontrolled    Past Surgical History  Procedure Laterality Date  . Lobectomy  2000    Right lower for squamous cell ca  . Needle removed from the right foot    . Lumbar laminectomy  1978  . Lymph node resected  1983    Axilla   . Spontaneous pneumothrax  1979  . Cardiac catheterization  2010    Stent  . Back surgery    . I&d extremity Left 11/10/2012    Procedure: IRRIGATION AND DEBRIDEMENT Left third-long finger ;  Surgeon: Jolyn Nap, MD;  Location: [facility];  Service: Orthopedics;  Laterality: Left;   Family History  Problem Relation Age of Onset  . Heart disease Neg Hx   . Diabetes Father   . Alcohol abuse Father    Social History  Substance Use Topics  . Smoking status: Former Smoker    Quit date: 02/16/1984  . Smokeless tobacco: None     Comment: Smoked for 30 years  . Alcohol Use: Yes     Comment: wine with supper    Review of Systems  Respiratory: Positive for cough and shortness of breath.   Musculoskeletal: Positive for arthralgias.  Skin: Positive for wound.  All other systems reviewed and are negative.     Allergies  Review of patient's allergies indicates no known allergies.  Home Medications   Prior to Admission medications   Medication Sig Start Date End Date Taking? Authorizing Provider  glucosamine-chondroitin 500-400 MG tablet Take 1 tablet by mouth 2 (two) times daily.     Historical Provider, MD  losartan (COZAAR) 25 MG tablet TAKE 1 TABLET DAILY  10/28/14   Minus Breeding, MD  magnesium oxide (MAG-OX) 400 MG tablet Take 400 mg by mouth daily.    Historical Provider, MD  Tamsulosin HCl (FLOMAX) 0.4 MG CAPS Take 0.4 mg by mouth daily.     Historical Provider, MD   BP 172/76 mmHg  Pulse 87  Temp(Src) 97.4 F (36.3 C) (Oral)  Resp 14  Ht '5\' 7"'$  (1.702 m)  Wt 68.04 kg  BMI 23.49 kg/m2  SpO2 97% Physical Exam  Constitutional: He is oriented to person, place, and time. He appears well-developed and well-nourished.  Non-toxic appearance.  HENT:  Head: Normocephalic.  Right Ear: Tympanic membrane and external ear normal.  Left Ear: Tympanic membrane and external ear normal.  Eyes: EOM and lids are normal. Pupils are equal, round, and reactive to light.  Neck: Normal  range of motion. Neck supple. Carotid bruit is not present.  Cardiovascular: Normal rate, regular rhythm, normal heart sounds, intact distal pulses and normal pulses.   Pulmonary/Chest: Breath sounds normal. No respiratory distress.  Abdominal: Soft. Bowel sounds are normal. There is no tenderness. There is no guarding.  Musculoskeletal: Normal range of motion.       Left hand: He exhibits tenderness. He exhibits normal range of motion, no deformity and no swelling. Normal sensation noted. Normal strength noted.       Hands: Lymphadenopathy:       Head (right side): No submandibular adenopathy present.       Head (left side): No submandibular adenopathy present.    He has no cervical adenopathy.  Neurological: He is alert and oriented to person, place, and time. He has normal strength. No cranial nerve deficit or sensory deficit.  Skin: Skin is warm and dry.  Psychiatric: He has a normal mood and affect. His speech is normal.  Nursing note and vitals reviewed.   ED Course  Procedures (including critical care time) Labs Review Labs Reviewed  COMPREHENSIVE METABOLIC PANEL  CBC WITH DIFFERENTIAL/PLATELET    Imaging Review No results found. I have personally reviewed and evaluated these images and lab results as part of my medical decision-making.   EKG Interpretation None      MDM  Vital signs are well within known normal limits with exception of the blood pressure being elevated at 172/76. Complete blood count and complete metabolic panel both well within normal limits. The patient will be treated with Augmentin. Patient is advised to stop the use of peroxide, and to use open water or Betadine. He is to see his primary physician or return to the emergency department if any signs of advancing infection. Patient acknowledges discharge instructions and is in agreement.    Final diagnoses:  None    **I have reviewed nursing notes, vital signs, and all appropriate lab and imaging  results for this patient.Lily Kocher, PA-C 01/13/15 [place], DO 01/14/15 2006

## 2015-01-21 ENCOUNTER — Emergency Department (HOSPITAL_COMMUNITY): Payer: Medicare Other

## 2015-01-21 ENCOUNTER — Emergency Department (HOSPITAL_COMMUNITY)
Admission: EM | Admit: 2015-01-21 | Discharge: 2015-01-21 | Disposition: A | Discharge status: Home / Self Care | Payer: Medicare Other | Attending: Emergency Medicine | Admitting: Emergency Medicine

## 2015-01-21 ENCOUNTER — Encounter (HOSPITAL_COMMUNITY): Payer: Self-pay

## 2015-01-21 ENCOUNTER — Emergency Department (HOSPITAL_COMMUNITY)
Admission: EM | Admit: 2015-01-21 | Discharge: 2015-01-21 | Disposition: A | Discharge status: Home / Self Care | Payer: Medicare Other | Source: Home / Self Care | Attending: Emergency Medicine | Admitting: Emergency Medicine

## 2015-01-21 ENCOUNTER — Encounter (HOSPITAL_COMMUNITY): Payer: Self-pay | Admitting: *Deleted

## 2015-01-21 DIAGNOSIS — R1030 Lower abdominal pain, unspecified: Secondary | ICD-10-CM | POA: Insufficient documentation

## 2015-01-21 DIAGNOSIS — E785 Hyperlipidemia, unspecified: Secondary | ICD-10-CM | POA: Insufficient documentation

## 2015-01-21 DIAGNOSIS — I2510 Atherosclerotic heart disease of native coronary artery without angina pectoris: Secondary | ICD-10-CM

## 2015-01-21 DIAGNOSIS — R1031 Right lower quadrant pain: Secondary | ICD-10-CM

## 2015-01-21 DIAGNOSIS — J449 Chronic obstructive pulmonary disease, unspecified: Secondary | ICD-10-CM

## 2015-01-21 DIAGNOSIS — K219 Gastro-esophageal reflux disease without esophagitis: Secondary | ICD-10-CM | POA: Diagnosis not present

## 2015-01-21 DIAGNOSIS — R52 Pain, unspecified: Secondary | ICD-10-CM

## 2015-01-21 DIAGNOSIS — M1990 Unspecified osteoarthritis, unspecified site: Secondary | ICD-10-CM | POA: Insufficient documentation

## 2015-01-21 DIAGNOSIS — N400 Enlarged prostate without lower urinary tract symptoms: Secondary | ICD-10-CM | POA: Insufficient documentation

## 2015-01-21 DIAGNOSIS — E119 Type 2 diabetes mellitus without complications: Secondary | ICD-10-CM | POA: Insufficient documentation

## 2015-01-21 DIAGNOSIS — Z79899 Other long term (current) drug therapy: Secondary | ICD-10-CM

## 2015-01-21 DIAGNOSIS — J441 Chronic obstructive pulmonary disease with (acute) exacerbation: Secondary | ICD-10-CM | POA: Insufficient documentation

## 2015-01-21 DIAGNOSIS — Z85118 Personal history of other malignant neoplasm of bronchus and lung: Secondary | ICD-10-CM | POA: Insufficient documentation

## 2015-01-21 DIAGNOSIS — I10 Essential (primary) hypertension: Secondary | ICD-10-CM | POA: Insufficient documentation

## 2015-01-21 DIAGNOSIS — Z9889 Other specified postprocedural states: Secondary | ICD-10-CM | POA: Insufficient documentation

## 2015-01-21 DIAGNOSIS — M25462 Effusion, left knee: Secondary | ICD-10-CM | POA: Diagnosis not present

## 2015-01-21 DIAGNOSIS — Z87891 Personal history of nicotine dependence: Secondary | ICD-10-CM | POA: Insufficient documentation

## 2015-01-21 DIAGNOSIS — Z7982 Long term (current) use of aspirin: Secondary | ICD-10-CM | POA: Insufficient documentation

## 2015-01-21 DIAGNOSIS — M25562 Pain in left knee: Secondary | ICD-10-CM | POA: Diagnosis present

## 2015-01-21 MED ORDER — DEXAMETHASONE 4 MG PO TABS
4.0000 mg | ORAL_TABLET | Freq: Two times a day (BID) | ORAL | Status: DC
Start: 2015-01-21 — End: 2015-12-22

## 2015-01-21 MED ORDER — HYDROCODONE-ACETAMINOPHEN 5-325 MG PO TABS: 1.0000 | ORAL_TABLET | ORAL | Status: DC | PRN

## 2015-01-21 NOTE — ED Provider Notes (Signed)
CSN: [id number]     Arrival date & time 01/21/15  1413 History   First MD Initiated Contact with Patient 01/21/15 1509     Chief Complaint  Patient presents with  . Knee Pain     (Consider location/radiation/quality/duration/timing/severity/associated sxs/prior Treatment) Patient is a 81 y.o. male presenting with knee pain. The history is provided by the patient.  Knee Pain Location:  Knee Knee location:  L knee Pain details:    Quality:  Aching and throbbing   Severity:  Moderate   Onset quality:  Gradual   Timing:  Intermittent   Progression:  Worsening Chronicity:  Recurrent Dislocation: no   Relieved by:  Nothing Worsened by:  Bearing weight Ineffective treatments:  Compression Associated symptoms: decreased ROM, stiffness and swelling   Risk factors: no frequent fractures and no recent illness     Past Medical History  Diagnosis Date  . GERD (gastroesophageal reflux disease)   . Arthritis   . COPD (chronic obstructive pulmonary disease) ([place]) 05/22/2008    FEV1/FVS 44.9  . Squamous cell carcinoma lung ([place]) 2000    CT w/ CM; 04/25/2009 COPD, s/p RLL, CAD, 5 cm ascending aortic aneurysm  . Spontaneous pneumothorax   . BPH (benign prostatic hyperplasia)   . Hyperlipidemia   . CAD (coronary artery disease)     s/p Xience stent ot the distal RAC 2010, PCI with Xience stent [facility] distal RCA at the [facility] 2011  . Hypertension   . Thoracic ascending aortic aneurysm (HCC)     4.8 cm   . Diabetes mellitus     Well controlled  . Type II or unspecified type diabetes mellitus without mention of complication, not stated as uncontrolled    Past Surgical History  Procedure Laterality Date  . Lobectomy  2000    Right lower for squamous cell ca  . Needle removed from the right foot    . Lumbar laminectomy  1978  . Lymph node resected  1983    Axilla  . Spontaneous pneumothrax  1979  . Cardiac catheterization  2010    Stent  . Back surgery    . I&d extremity Left 11/10/2012     Procedure: IRRIGATION AND DEBRIDEMENT Left third-long finger ;  Surgeon: Jolyn Nap, MD;  Location: [facility];  Service: Orthopedics;  Laterality: Left;   Family History  Problem Relation Age of Onset  . Heart disease Neg Hx   . Diabetes Father   . Alcohol abuse Father    Social History  Substance Use Topics  . Smoking status: Former Smoker    Quit date: 02/16/1984  . Smokeless tobacco: None     Comment: Smoked for 30 years  . Alcohol Use: Yes     Comment: wine with supper    Review of Systems  Respiratory: Positive for shortness of breath.   Musculoskeletal: Positive for arthralgias and stiffness.  All other systems reviewed and are negative.     Allergies  Review of patient's allergies indicates no known allergies.  Home Medications   Prior to Admission medications   Medication Sig Start Date End Date Taking? Authorizing Provider  aspirin EC 81 MG tablet Take 81 mg by mouth daily.   Yes Historical Provider, MD  losartan (COZAAR) 25 MG tablet TAKE 1 TABLET DAILY 10/28/14  Yes Minus Breeding, MD  magnesium oxide (MAG-OX) 400 MG tablet Take 400 mg by mouth daily.   Yes Historical Provider, MD  Misc Natural Products (OSTEO BI-FLEX JOINT SHIELD PO) Take  1 tablet by mouth 2 (two) times daily.   Yes Historical Provider, MD  Omega-3 Fatty Acids (FISH OIL) 1000 MG CAPS Take 3 capsules by mouth daily.   Yes Historical Provider, MD  Tamsulosin HCl (FLOMAX) 0.4 MG CAPS Take 0.4 mg by mouth daily after supper.    Yes Historical Provider, MD  amoxicillin-clavulanate (AUGMENTIN) 875-125 MG tablet Take 1 tablet by mouth every 12 (twelve) hours. Please take with a meal Patient not taking: Reported on 01/21/2015 01/12/15   Lily Kocher, PA-C   BP 124/78 mmHg  Pulse 86  Temp(Src) 97.9 F (36.6 C) (Oral)  Resp 18  Ht 5' 7.5" (1.715 m)  Wt 68.04 kg  BMI 23.13 kg/m2  SpO2 97% Physical Exam  Constitutional: He is oriented to person, place, and time. He appears well-developed and  well-nourished.  Non-toxic appearance.  HENT:  Head: Normocephalic.  Right Ear: Tympanic membrane and external ear normal.  Left Ear: Tympanic membrane and external ear normal.  Eyes: EOM and lids are normal. Pupils are equal, round, and reactive to light.  Neck: Normal range of motion. Neck supple. Carotid bruit is not present.  Cardiovascular: Normal rate, regular rhythm, normal heart sounds, intact distal pulses and normal pulses.   Pulmonary/Chest: Breath sounds normal. No respiratory distress.  Abdominal: Soft. Bowel sounds are normal. There is no tenderness. There is no guarding.  Musculoskeletal: Normal range of motion.       Left knee: He exhibits swelling and effusion. He exhibits no erythema. Tenderness found. Medial joint line tenderness noted.  Lymphadenopathy:       Head (right side): No submandibular adenopathy present.       Head (left side): No submandibular adenopathy present.    He has no cervical adenopathy.  Neurological: He is alert and oriented to person, place, and time. He has normal strength. No cranial nerve deficit or sensory deficit.  Skin: Skin is warm and dry.  Psychiatric: He has a normal mood and affect. His speech is normal.  Nursing note and vitals reviewed.   ED Course  Procedures (including critical care time) Labs Review Labs Reviewed - No data to display  Imaging Review Dg Knee Complete 4 Views Left  01/21/2015  CLINICAL DATA:  Chronic left knee pain, worse in the winter. EXAM: LEFT KNEE - COMPLETE 4+ VIEW COMPARISON:  None. FINDINGS: Chondrocalcinosis is present in the menisci. Mild degenerative changes are most evident within the medial and patellofemoral compartments of the knee. There is no significant effusion. Mild osteopenia is present. No acute osseous abnormality is present. Atherosclerotic calcifications are noted. IMPRESSION: 1. Mild degenerate changes within the medial and patellofemoral compartments of the knee without acute or focal  osseous abnormality. 2. Atherosclerosis. 3. 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 Electronically Signed   By: San Morelle M.D.   On: 01/21/2015 15:21   I have personally reviewed and evaluated these images and lab results as part of my medical decision-making.   EKG Interpretation None      MDM  Vital signs are well within normal limits. X-ray of the left knee reveals degenerative changes of the medial and patellofemoral compartments of the knee without acute osseous abnormality. There is also noted chondrocalcinosis within the meniscus.  The examination favors effusion and degenerative joint disease involving the left knee. The patient will be fitted with an Ace wrap, and Decadron, and Norco. He is scheduled to see the orthopedic specialist in [place] in about a week.    Final diagnoses:  None    *I have reviewed nursing notes, vital signs, and all appropriate lab and imaging results for this patient.[address], PA-C 01/21/15 Sisters Liu, MD 01/22/15 [phone]

## 2015-01-21 NOTE — ED Provider Notes (Signed)
CSN: [id number]     Arrival date & time 01/21/15  2145 History  By signing my name below, I, Brian Austin, attest that this documentation has been prepared under the direction and in the presence of [name], MD. Electronically Signed: Altamease Austin, ED Scribe. 01/21/2015. 10:07 PM    Chief Complaint  Patient presents with  . Groin Pain   Patient is a 81 y.o. male presenting with groin pain. The history is provided by the patient. No language interpreter was used.  Groin Pain This is a new problem. The current episode started more than 1 week ago. Episode frequency: Intermittently. The problem has not changed since onset.Pertinent negatives include no chest pain, no abdominal pain and no headaches. Nothing aggravates the symptoms. Nothing relieves the symptoms. He has tried nothing for the symptoms.   Brought in by EMS, Brian Austin is a 81 y.o. male who presents to the Emergency Department complaining of intermittent and atraumatic right-sided groin pain with onset several months ago. Tonight he had an episode of sharp pain at the groin that resolved spontaneously and with no intervention  PTA.  Associated symptoms include an area of swelling at the right side of the groin.   Past Medical History  Diagnosis Date  . GERD (gastroesophageal reflux disease)   . Arthritis   . COPD (chronic obstructive pulmonary disease) ([place]) 05/22/2008    FEV1/FVS 44.9  . Squamous cell carcinoma lung ([place]) 2000    CT w/ CM; 04/25/2009 COPD, s/p RLL, CAD, 5 cm ascending aortic aneurysm  . Spontaneous pneumothorax   . BPH (benign prostatic hyperplasia)   . Hyperlipidemia   . CAD (coronary artery disease)     s/p Xience stent ot the distal RAC 2010, PCI with Xience stent to distal RCA at the [facility] 2011  . Hypertension   . Thoracic ascending aortic aneurysm (HCC)     4.8 cm   . Diabetes mellitus     Well controlled  . Type II or unspecified type diabetes mellitus without mention of  complication, not stated as uncontrolled    Past Surgical History  Procedure Laterality Date  . Lobectomy  2000    Right lower for squamous cell ca  . Needle removed from the right foot    . Lumbar laminectomy  1978  . Lymph node resected  1983    Axilla  . Spontaneous pneumothrax  1979  . Cardiac catheterization  2010    Stent  . Back surgery    . I&d extremity Left 11/10/2012    Procedure: IRRIGATION AND DEBRIDEMENT Left third-long finger ;  Surgeon: Jolyn Nap, MD;  Location: [place];  Service: Orthopedics;  Laterality: Left;   Family History  Problem Relation Age of Onset  . Heart disease Neg Hx   . Diabetes Father   . Alcohol abuse Father    Social History  Substance Use Topics  . Smoking status: Former Smoker    Quit date: 02/16/1984  . Smokeless tobacco: None     Comment: Smoked for 30 years  . Alcohol Use: Yes     Comment: wine with supper    Review of Systems  Constitutional: Negative for appetite change and fatigue.  HENT: Negative for congestion, ear discharge and sinus pressure.   Eyes: Negative for discharge.  Respiratory: Negative for cough.   Cardiovascular: Negative for chest pain.  Gastrointestinal: Negative for abdominal pain and diarrhea.  Genitourinary: Negative for frequency and hematuria.  Right groin pain and swelling  Musculoskeletal: Negative for back pain.  Skin: Negative for rash.  Neurological: Negative for seizures and headaches.  Psychiatric/Behavioral: Negative for hallucinations.    Allergies  Review of patient's allergies indicates no known allergies.  Home Medications   Prior to Admission medications   Medication Sig Start Date End Date Taking? Authorizing Provider  amoxicillin-clavulanate (AUGMENTIN) 875-125 MG tablet Take 1 tablet by mouth every 12 (twelve) hours. Please take with a meal Patient not taking: Reported on 01/21/2015 01/12/15   Lily Kocher, PA-C  aspirin EC 81 MG tablet Take 81 mg by mouth daily.     Historical Provider, MD  dexamethasone (DECADRON) 4 MG tablet Take 1 tablet (4 mg total) by mouth 2 (two) times daily with a meal. 01/21/15   Lily Kocher, PA-C  HYDROcodone-acetaminophen (NORCO/VICODIN) 5-325 MG tablet Take 1 tablet by mouth every 4 (four) hours as needed. 01/21/15   Lily Kocher, PA-C  losartan (COZAAR) 25 MG tablet TAKE 1 TABLET DAILY 10/28/14   Minus Breeding, MD  magnesium oxide (MAG-OX) 400 MG tablet Take 400 mg by mouth daily.    Historical Provider, MD  Misc Natural Products (OSTEO BI-FLEX JOINT SHIELD PO) Take 1 tablet by mouth 2 (two) times daily.    Historical Provider, MD  Omega-3 Fatty Acids (FISH OIL) 1000 MG CAPS Take 3 capsules by mouth daily.    Historical Provider, MD  Tamsulosin HCl (FLOMAX) 0.4 MG CAPS Take 0.4 mg by mouth daily after supper.     Historical Provider, MD   BP 134/64 mmHg  Pulse 85  Temp(Src) 98.5 F (36.9 C) (Oral)  Resp 16  Ht '5\' 7"'$  (1.702 m)  Wt 150 lb (68.04 kg)  BMI 23.49 kg/m2  SpO2 97% Physical Exam  Constitutional: He is oriented to person, place, and time. He appears well-developed.  HENT:  Head: Normocephalic.  Eyes: Conjunctivae and EOM are normal. No scleral icterus.  Neck: Neck supple. No thyromegaly present.  Cardiovascular: Normal rate and regular rhythm.  Exam reveals no gallop and no friction rub.   No murmur heard. Pulmonary/Chest: No stridor. He has no wheezes. He has no rales. He exhibits no tenderness.  Abdominal: He exhibits no distension. There is no tenderness. There is no rebound.  Genitourinary:  Minor right inguinal tenderness  Musculoskeletal: Normal range of motion. He exhibits no edema.  Lymphadenopathy:    He has no cervical adenopathy.  Neurological: He is oriented to person, place, and time. He exhibits normal muscle tone. Coordination normal.  Skin: No rash noted. No erythema.  Psychiatric: He has a normal mood and affect. His behavior is normal.    ED Course  Procedures (including critical  care time) DIAGNOSTIC STUDIES:  Oxygen Saturation is 97% on RA,  normal by my interpretation.    COORDINATION OF CARE: 10:04 PM Discussed treatment plan which includes CT renal stone with pt at bedside and pt agreed to plan.  Labs Review Labs Reviewed - No data to display  Imaging Review Dg Knee Complete 4 Views Left  01/21/2015  CLINICAL DATA:  Chronic left knee pain, worse in the winter. EXAM: LEFT KNEE - COMPLETE 4+ VIEW COMPARISON:  None. FINDINGS: Chondrocalcinosis is present in the menisci. Mild degenerative changes are most evident within the medial and patellofemoral compartments of the knee. There is no significant effusion. Mild osteopenia is present. No acute osseous abnormality is present. Atherosclerotic calcifications are noted. IMPRESSION: 1. Mild degenerate changes within the medial and patellofemoral compartments of the  knee without acute or focal osseous abnormality. 2. Atherosclerosis. 3. 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 Electronically Signed   By: San Morelle M.D.   On: 01/21/2015 15:21   I have personally reviewed and evaluated these images as part of my medical decision-making.   EKG Interpretation None      MDM   Final diagnoses:  Pain   CT scan the abdomen negative. Patient with history of groin swelling. Possible inguinal hernia. Patient will be referred to general surgery  The chart was scribed for me under my direct supervision.  I personally performed the history, physical, and medical decision making and all procedures in the evaluation of this patient.[name], MD 01/21/15 [phone]

## 2015-01-21 NOTE — Discharge Instructions (Signed)
Keep your knee warm possible. Apply the Ace bandage for additional support. Use Decadron daily, and Tylenol for mild discomfort. Use Norco for more severe discomfort. Norco may cause drowsiness, please use this medication with caution. Knee Effusion Knee effusion means that you have excess fluid in your knee joint. This can cause pain and swelling in your knee. This may make your knee more difficult to bend and move. That is because there is increased pain and pressure in the joint. If there is fluid in your knee, it often means that something is wrong inside your knee, such as severe arthritis, abnormal inflammation, or an infection. Another common cause of knee effusion is an injury to the knee muscles, ligaments, or cartilage. HOME CARE INSTRUCTIONS  Use crutches as directed by your health care provider.  Wear a knee brace as directed by your health care provider.  Apply ice to the swollen area:  Put ice in a plastic bag.  Place a towel between your skin and the bag.  Leave the ice on for 20 minutes, 2-3 times per day.  Keep your knee raised (elevated) when you are sitting or lying down.  Take medicines only as directed by your health care provider.  Do any rehabilitation or strengthening exercises as directed by your health care provider.  Rest your knee as directed by your health care provider. You may start doing your normal activities again when your health care provider approves.   Keep all follow-up visits as directed by your health care provider. This is important. SEEK MEDICAL CARE IF:  You have ongoing (persistent) pain in your knee. SEEK IMMEDIATE MEDICAL CARE IF:  You have increased swelling or redness of your knee.  You have severe pain in your knee.  You have a fever.   This information is not intended to replace advice given to you by your health care provider. Make sure you discuss any questions you have with your health care provider.   Document Released:  04/24/2003 Document Revised: 02/22/2014 Document Reviewed: 09/17/2013 Elsevier Interactive Patient Education [employer].

## 2015-01-21 NOTE — Discharge Instructions (Signed)
Follow up with dr. Arnoldo Morale for possible right groin hernia

## 2015-01-21 NOTE — ED Notes (Signed)
Pt seen here earlier today for knee pain. Pt called EMS c/o of right groin pain which ems states the patient informed them had stopped by the time EMS arrived. Pt states he has had this pain for "several months".

## 2015-01-21 NOTE — ED Notes (Signed)
Pt states he has chronic left knee pain, with surgery around 6 years ago. Pt states his pain gets worse in the winter, denies any new injury. Pt has appt for his knee next week.

## 2015-09-25 ENCOUNTER — Telehealth: Payer: Self-pay | Admitting: Cardiology

## 2015-09-25 NOTE — Telephone Encounter (Signed)
Returned call to patient:  Pt reports dizzy spells and "feeling bad" x 2-3 weeks. Pt reports dizzy spells started when he got his hearing aids. Reports dizziness when lying down for 2-3 minutes then it goes away, dizziness resumes when he gets up.  . Reports it "feels like I'm dying when this happens."  Denies CP, arm pain, SOB, LOC.   Reports fatigue.    Concerned with his BP readings and concerned that they are low:  8/9: 105/45 P-78 106/53 P-72 112/58 P-69 124/59 P-75  8/10 115/56 P-75  Advised that while some BP readings are on the lower end, I do not believe that is what is causing him to have these spells and possibly may need to see PCP to r/o inner ear issues causing dizziness.    Advised I would send to DOD for further advice.  Routed to DOD, MD Crenshaw.

## 2015-09-25 NOTE — Telephone Encounter (Signed)
BP would not appear to explain symptoms; fu primary care and Dr Percival Spanish. Kirk Ruths

## 2015-09-25 NOTE — Telephone Encounter (Signed)
Attempted to call patient back-no answer LMTCB.  F/u scheduled with MD Hochrein for 9/12 at 345 @ [facility] location.

## 2015-09-25 NOTE — Telephone Encounter (Signed)
09-24-15  Was feeling bad on yesterday with  BP @ 7:15 105/45 PULSE  78 WITH SKIPPING A BEAT @ 9:00   106/53 WITH 72  @ 1:00  112/58 PULSE 69 @ 3:00  124/59 pulse 75  Start feeing better.   AT 5 AM  TODAY  128/63 PULSE 60 1:30  115/56 pulse 75  Have had several dizzy spell for several weeks.  Have not been feel good for the past  several days.

## 2015-09-26 NOTE — Telephone Encounter (Signed)
Called patient-made aware of MD Crenshaw recommendations.  Reports [facility].  Made aware of f/u appt with MD Hochrein.    Pt verbalized understanding.  Advised to call with any further questions/concerns.

## 2015-10-13 ENCOUNTER — Encounter: Payer: Self-pay | Admitting: Cardiology

## 2015-10-23 ENCOUNTER — Other Ambulatory Visit: Payer: Self-pay | Admitting: Cardiology

## 2015-10-26 NOTE — Progress Notes (Signed)
HPI The patient presents for followup of his known coronary disease and  of dizziness.   He was complaining of dizziness and a tingling sensation when I saw him previously. I applied a Holter and he had some PVCs. Since I last saw him he again had dizziness.  His BP was mildly low when he called in to give [place] a report . He says at least does have some low blood pressures and he occasionally doesn't take his Cozaar but at other times his pressure runs moderately high and it's typically high when he sees [place]. He's very active. Takes care of 2 cats, 2 horses, 5 dogs and a donkey named BJ.  With this he does not get chest discomfort, neck or arm discomfort. He he does have occasional palpitations and occasionally notices his heart rate skipping beats. He does not have any PND or orthopnea. He's not had any weight gain or edema. In fact he lost a few pounds since I last saw him.  No Known Allergies  Current Outpatient Prescriptions  Medication Sig Dispense Refill  . amoxicillin-clavulanate (AUGMENTIN) 875-125 MG tablet Take 1 tablet by mouth every 12 (twelve) hours. Please take with a meal 10 tablet 0  . aspirin EC 81 MG tablet Take 81 mg by mouth daily.    Marland Kitchen dexamethasone (DECADRON) 4 MG tablet Take 1 tablet (4 mg total) by mouth 2 (two) times daily with a meal. 12 tablet 0  . HYDROcodone-acetaminophen (NORCO/VICODIN) 5-325 MG tablet Take 1 tablet by mouth every 4 (four) hours as needed. (Patient taking differently: Take 1 tablet by mouth every 4 (four) hours as needed for moderate pain or severe pain. ) 15 tablet 0  . losartan (COZAAR) 25 MG tablet Take 1 tablet (25 mg total) by mouth daily. Please keep scheduled 9/12/7 appt for further refills. 30 tablet 0  . magnesium oxide (MAG-OX) 400 MG tablet Take 400 mg by mouth daily.    . Misc Natural Products (OSTEO BI-FLEX JOINT SHIELD PO) Take 1 tablet by mouth 2 (two) times daily.    . Omega-3 Fatty Acids (FISH OIL) 1000 MG CAPS Take 3 capsules by mouth  daily.    . Tamsulosin HCl (FLOMAX) 0.4 MG CAPS Take 0.4 mg by mouth daily after supper.      No current facility-administered medications for this visit.     Past Medical History:  Diagnosis Date  . Arthritis   . BPH (benign prostatic hyperplasia)   . CAD (coronary artery disease)    s/p Xience stent ot the distal RAC 2010, PCI with Xience stent to distal RCA at the [facility] 2011  . COPD (chronic obstructive pulmonary disease) ([place]) 05/22/2008   FEV1/FVS 44.9  . Diabetes mellitus    Well controlled  . GERD (gastroesophageal reflux disease)   . Hyperlipidemia   . Hypertension   . Spontaneous pneumothorax   . Squamous cell carcinoma lung ([place]) 2000   CT w/ CM; 04/25/2009 COPD, s/p RLL, CAD, 5 cm ascending aortic aneurysm  . Thoracic ascending aortic aneurysm (H[facility])    4.8 cm   . Type II or unspecified type diabetes mellitus without mention of complication, not stated as uncontrolled     Past Surgical History:  Procedure Laterality Date  . BACK SURGERY    . CARDIAC CATHETERIZATION  2010   Stent  . I&D EXTREMITY Left 11/10/2012   Procedure: IRRIGATION AND DEBRIDEMENT Left third-long finger ;  Surgeon: Jolyn Nap, MD;  Location: [place];  Service:  Orthopedics;  Laterality: Left;  . LOBECTOMY  2000   Right lower for squamous cell ca  . LUMBAR LAMINECTOMY  1978  . Lymph node resected  1983   Axilla  . Needle removed from the right foot    . Spontaneous pneumothrax  1979    ROS:  Positive for knee pain and HOH.   As stated in the HPI and negative for all other systems.  PHYSICAL EXAM BP (!) 150/78   Pulse 66   Ht 5' 7.5" (1.715 m)   Wt 154 lb 12.8 oz (70.2 kg)   BMI 23.89 kg/m  GENERAL:  Well appearing and looks much younger than stated age.  HEENT:  Pupils equal round and reactive, fundi not visualized, oral mucosa unremarkable, dentures NECK:  No jugular venous distention, waveform within normal limits, carotid upstroke brisk and symmetric, no bruits, no  thyromegaly LYMPHATICS:  No cervical, inguinal adenopathy LUNGS:  Scattered bilateral expiratory wheezes BACK:  No CVA tenderness CHEST:  Unremarkable HEART:  PMI not displaced or sustained,S1 and S2 within normal limits, no S3, no S4, no clicks, no rubs, no murmurs ABD:  Flat, positive bowel sounds normal in frequency in pitch, no bruits, no rebound, no guarding, no midline pulsatile mass, no hepatomegaly, no splenomegaly EXT:  2 plus pulses throughout, no edema, no cyanosis no clubbing   Lab Results  Component Value Date   CHOL 138 06/14/2014   TRIG 53.0 06/14/2014   HDL 40.20 06/14/2014   LDLCALC 87 06/14/2014    EKG:  Sinus rhythm, rate 66, right bundle branch block, left anterior fascicular block, no acute ST-T wave changes. 10/28/2015  ASSESSMENT AND PLAN  PALPITATIONS:  Holter demonstrated no significant arrhythmias and he has had no change in symptoms since this study. He's not having any significant symptoms. No further testing is suggested.  CAD:  The patient has no new sypmtoms.  No further cardiovascular testing is indicated.  We will continue with aggressive risk reduction and meds as listed.   Of note he might have had knee surgery in the future and I would probably want to do a stress test prior to this. He'll let me know.   HTN:  He will continue current meds as listed.  On the days he does have a particularly low blood pressure he will hold his Cozaar.  AORTIC ANEURYSM:  I do not see follow-up of this recently.  He will need to have a CT angiogram of his chest to follow up.   DYSLIPIDEMIA:  His LDL was 87 in April if last year.  He will have a repeat lipid and liver.

## 2015-10-28 ENCOUNTER — Encounter: Payer: Self-pay | Admitting: Cardiology

## 2015-10-28 ENCOUNTER — Ambulatory Visit (INDEPENDENT_AMBULATORY_CARE_PROVIDER_SITE_OTHER): Payer: Medicare Other | Admitting: Cardiology

## 2015-10-28 VITALS — BP 150/78 | HR 66 | Ht 67.5 in | Wt 154.8 lb

## 2015-10-28 DIAGNOSIS — I7121 Aneurysm of the ascending aorta, without rupture: Secondary | ICD-10-CM

## 2015-10-28 DIAGNOSIS — I712 Thoracic aortic aneurysm, without rupture: Secondary | ICD-10-CM | POA: Diagnosis not present

## 2015-10-28 DIAGNOSIS — I10 Essential (primary) hypertension: Secondary | ICD-10-CM | POA: Diagnosis not present

## 2015-10-28 DIAGNOSIS — E785 Hyperlipidemia, unspecified: Secondary | ICD-10-CM

## 2015-10-28 NOTE — Patient Instructions (Addendum)
Medication Instructions:  Continue current medications  Labwork: Fasting Lipids and CMP  Testing/Procedures: Your physician has requested that you have a CT Angio  Follow-Up: Your physician wants you to follow-up in: 1 Year You will receive a reminder letter in the mail two months in advance. If you don't receive a letter, please call our office to schedule the follow-up appointment.   Any Other Special Instructions Will Be Listed Below (If Applicable).   If you need a refill on your cardiac medications before your next appointment, please call your pharmacy.

## 2015-10-29 ENCOUNTER — Other Ambulatory Visit: Payer: Medicare Other | Admitting: *Deleted

## 2015-10-29 DIAGNOSIS — I2583 Coronary atherosclerosis due to lipid rich plaque: Secondary | ICD-10-CM

## 2015-10-29 DIAGNOSIS — I2510 Atherosclerotic heart disease of native coronary artery without angina pectoris: Secondary | ICD-10-CM

## 2015-10-29 DIAGNOSIS — E785 Hyperlipidemia, unspecified: Secondary | ICD-10-CM

## 2015-10-29 LAB — BASIC METABOLIC PANEL
BUN: 14 mg/dL (ref 7–25)
CHLORIDE: 103 mmol/L (ref 98–110)
CO2: 28 mmol/L (ref 20–31)
Calcium: 9.6 mg/dL (ref 8.6–10.3)
Creat: 0.78 mg/dL (ref 0.70–1.11)
Glucose, Bld: 97 mg/dL (ref 65–99)
POTASSIUM: 4.5 mmol/L (ref 3.5–5.3)
SODIUM: 140 mmol/L (ref 135–146)

## 2015-10-29 LAB — LIPID PANEL
CHOL/HDL RATIO: 3.2 ratio (ref <=5.0)
Cholesterol: 154 mg/dL (ref 125–200)
HDL: 48 mg/dL (ref >=40)
LDL CALC: 92 mg/dL (ref <130)
Triglycerides: 70 mg/dL (ref <150)
VLDL: 14 mg/dL (ref <30)

## 2015-11-03 ENCOUNTER — Telehealth: Payer: Self-pay | Admitting: *Deleted

## 2015-11-03 NOTE — Telephone Encounter (Signed)
-----   Message from Minus Breeding, MD sent at 10/30/2015  8:06 PM EDT ----- His LDL is not at target.  In 2015 he wanted to stop Pravastatin.  Would he consider restarting a statin and if so start Lipitor 20 mg and check lipid and liver in 8 weeks.  Call Mr. Balke with the results

## 2015-11-03 NOTE — Telephone Encounter (Signed)
Pt do not want to start on any statin, pt stated he will work on getting his cholesterol better himself

## 2015-11-04 ENCOUNTER — Ambulatory Visit (INDEPENDENT_AMBULATORY_CARE_PROVIDER_SITE_OTHER)
Admission: RE | Admit: 2015-11-04 | Discharge: 2015-11-04 | Disposition: A | Discharge status: Home / Self Care | Payer: Medicare Other | Source: Ambulatory Visit | Attending: Cardiology | Admitting: Cardiology

## 2015-11-04 DIAGNOSIS — I712 Thoracic aortic aneurysm, without rupture: Secondary | ICD-10-CM | POA: Diagnosis not present

## 2015-11-04 DIAGNOSIS — I7121 Aneurysm of the ascending aorta, without rupture: Secondary | ICD-10-CM

## 2015-11-04 MED ORDER — IOPAMIDOL (ISOVUE-370) INJECTION 76%
100.0000 mL | Freq: Once | INTRAVENOUS | Status: AC | PRN
  Administered 2015-11-04: 100 mL via INTRAVENOUS

## 2015-12-04 ENCOUNTER — Other Ambulatory Visit: Payer: Self-pay | Admitting: Cardiology

## 2015-12-04 NOTE — Telephone Encounter (Signed)
REFILL 

## 2015-12-11 ENCOUNTER — Other Ambulatory Visit: Payer: Self-pay | Admitting: Thoracic Surgery (Cardiothoracic Vascular Surgery)

## 2015-12-11 DIAGNOSIS — I712 Thoracic aortic aneurysm, without rupture, unspecified: Secondary | ICD-10-CM

## 2015-12-22 ENCOUNTER — Encounter (HOSPITAL_COMMUNITY): Payer: Self-pay | Admitting: *Deleted

## 2015-12-22 ENCOUNTER — Emergency Department (HOSPITAL_COMMUNITY)
Admission: EM | Admit: 2015-12-22 | Discharge: 2015-12-22 | Disposition: A | Discharge status: Home / Self Care | Payer: Medicare Other | Attending: Emergency Medicine | Admitting: Emergency Medicine

## 2015-12-22 ENCOUNTER — Emergency Department (HOSPITAL_COMMUNITY): Payer: Medicare Other

## 2015-12-22 DIAGNOSIS — Z85118 Personal history of other malignant neoplasm of bronchus and lung: Secondary | ICD-10-CM | POA: Diagnosis not present

## 2015-12-22 DIAGNOSIS — Z7982 Long term (current) use of aspirin: Secondary | ICD-10-CM | POA: Diagnosis not present

## 2015-12-22 DIAGNOSIS — R1030 Lower abdominal pain, unspecified: Secondary | ICD-10-CM

## 2015-12-22 DIAGNOSIS — Z87891 Personal history of nicotine dependence: Secondary | ICD-10-CM | POA: Insufficient documentation

## 2015-12-22 DIAGNOSIS — I2510 Atherosclerotic heart disease of native coronary artery without angina pectoris: Secondary | ICD-10-CM | POA: Diagnosis not present

## 2015-12-22 DIAGNOSIS — R1031 Right lower quadrant pain: Secondary | ICD-10-CM | POA: Diagnosis not present

## 2015-12-22 DIAGNOSIS — E119 Type 2 diabetes mellitus without complications: Secondary | ICD-10-CM | POA: Diagnosis not present

## 2015-12-22 DIAGNOSIS — I10 Essential (primary) hypertension: Secondary | ICD-10-CM | POA: Diagnosis not present

## 2015-12-22 DIAGNOSIS — Z79899 Other long term (current) drug therapy: Secondary | ICD-10-CM | POA: Insufficient documentation

## 2015-12-22 DIAGNOSIS — J449 Chronic obstructive pulmonary disease, unspecified: Secondary | ICD-10-CM | POA: Diagnosis not present

## 2015-12-22 LAB — COMPREHENSIVE METABOLIC PANEL
ALBUMIN: 4.3 g/dL (ref 3.5–5.0)
ALT: 21 U/L (ref 17–63)
AST: 22 U/L (ref 15–41)
Alkaline Phosphatase: 52 U/L (ref 38–126)
Anion gap: 6 (ref 5–15)
BILIRUBIN TOTAL: 0.7 mg/dL (ref 0.3–1.2)
BUN: 14 mg/dL (ref 6–20)
CO2: 28 mmol/L (ref 22–32)
Calcium: 9.5 mg/dL (ref 8.9–10.3)
Chloride: 103 mmol/L (ref 101–111)
Creatinine, Ser: 0.74 mg/dL (ref 0.61–1.24)
GFR calc Af Amer: >60 mL/min (ref >60)
GFR calc non Af Amer: >60 mL/min (ref >60)
GLUCOSE: 103 mg/dL — AB (ref 65–99)
POTASSIUM: 3.9 mmol/L (ref 3.5–5.1)
Sodium: 137 mmol/L (ref 135–145)
TOTAL PROTEIN: 7.2 g/dL (ref 6.5–8.1)

## 2015-12-22 LAB — URINALYSIS, ROUTINE W REFLEX MICROSCOPIC
BILIRUBIN URINE: NEGATIVE
GLUCOSE, UA: NEGATIVE mg/dL
Hgb urine dipstick: NEGATIVE
KETONES UR: NEGATIVE mg/dL
Leukocytes, UA: NEGATIVE
NITRITE: NEGATIVE
PH: 6.5 (ref 5.0–8.0)
Protein, ur: NEGATIVE mg/dL
Specific Gravity, Urine: <1.005 — ABNORMAL LOW (ref 1.005–1.030)

## 2015-12-22 LAB — CBC
HCT: 46.4 % (ref 39.0–52.0)
Hemoglobin: 15.3 g/dL (ref 13.0–17.0)
MCH: 30.1 pg (ref 26.0–34.0)
MCHC: 33 g/dL (ref 30.0–36.0)
MCV: 91.2 fL (ref 78.0–100.0)
Platelets: 185 10*3/uL (ref 150–400)
RBC: 5.09 MIL/uL (ref 4.22–5.81)
RDW: 14.1 % (ref 11.5–15.5)
WBC: 7.1 10*3/uL (ref 4.0–10.5)

## 2015-12-22 LAB — LIPASE, BLOOD: Lipase: 24 U/L (ref 11–51)

## 2015-12-22 MED ORDER — IOPAMIDOL (ISOVUE-300) INJECTION 61%
100.0000 mL | Freq: Once | INTRAVENOUS | Status: AC | PRN
  Administered 2015-12-22: 100 mL via INTRAVENOUS

## 2015-12-22 MED ORDER — IOPAMIDOL (ISOVUE-300) INJECTION 61%
INTRAVENOUS | Status: AC
  Administered 2015-12-22: 30 mL via ORAL
  Filled 2015-12-22: qty 30

## 2015-12-22 MED ORDER — IOPAMIDOL (ISOVUE-300) INJECTION 61%: 30.0000 mL | Freq: Once | INTRAVENOUS | Status: DC | PRN

## 2015-12-22 NOTE — ED Notes (Signed)
Pt drinking contrast. 

## 2015-12-22 NOTE — ED Notes (Signed)
Pt returned from ct

## 2015-12-22 NOTE — ED Triage Notes (Signed)
Pt comes in with RLQ pain starting Saturday morning at 0600. Pt states he has had nausea and loose stools. Denies any vomiting. NAD noted.

## 2015-12-22 NOTE — Discharge Instructions (Signed)
Follow-up with the stomach Dr. Dr. Oneida Alar

## 2015-12-22 NOTE — ED Provider Notes (Signed)
[facility] DEPT Provider Note   CSN: [id number] Arrival date & time: 12/22/15  0827  By signing my name below, I, Brian Austin, attest that this documentation has been prepared under the direction and in the presence of [name], MD . Electronically Signed: Higinio Austin, Scribe. 12/22/2015. 10:03 AM.  History   Chief Complaint Chief Complaint  Patient presents with  . Abdominal Pain   The history is provided by the patient. No language interpreter was used.  Abdominal Pain   The current episode started 2 days ago. The problem has been gradually worsening. The pain is located in the RLQ. Pertinent negatives include diarrhea, vomiting, frequency, hematuria and headaches. Past workup includes CT scan. Past workup does not include GI consult or surgery.   HPI Comments: Brian Austin is a 82 y.o. male with PMHx of Type II DM, HTN and HLD, who presents to the Emergency Department complaining of intermittent, RLQ abdominal pain that began 2 days ago and worsened last night. Pt reports associated nausea and loose stools. He states he has experienced similar pain before in which he visited the ED and was diagnosed with an inguinal hernia and was scheduled for surgery. However, he notes he was told right before his surgery that "he did not have a hernia" and the surgery was unnecessary. Pt denies vomiting and being followed by an abdominal specialist.   Past Medical History:  Diagnosis Date  . Arthritis   . BPH (benign prostatic hyperplasia)   . CAD (coronary artery disease)    s/p Xience stent ot the distal RAC 2010, PCI with Xience stent to distal RCA at the [facility] 2011  . COPD (chronic obstructive pulmonary disease) ([place]) 05/22/2008   FEV1/FVS 44.9  . Diabetes mellitus    Well controlled  . GERD (gastroesophageal reflux disease)   . Hyperlipidemia   . Hypertension   . Spontaneous pneumothorax   . 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 Thoracic ascending aortic aneurysm (HCC)    4.8 cm   . Type II or unspecified type diabetes mellitus without mention of complication, not stated as uncontrolled     Patient Active Problem List   Diagnosis Date Noted  . Nontraumatic incomplete tear of right rotator cuff 01/22/2013  . Pain in joint, shoulder region 01/22/2013  . Dog bite of hand 11/10/2012  . Cellulitis and abscess of hand 11/10/2012  . Hyperlipidemia   . Type II or unspecified type diabetes mellitus without mention of complication, not stated as uncontrolled   . Jaw pain   . CAD (coronary artery disease)   . ASCENDING AORTIC ANEURYSM 04/28/2009  . HEMOPTYSIS UNSPECIFIED 04/25/2009  . CHEST PAIN, ATYPICAL 04/25/2009  . LUNG CANCER, HX OF 04/25/2009  . Chronic ischemic heart disease 07/02/2008  . MITRAL REGURGITATION 06/28/2008  . Essential hypertension 06/28/2008  . COPD 06/28/2008  . CARDIOVASCULAR FUNCTION STUDY, ABNORMAL 06/19/2008  . AODM 06/13/2008  . Hyperlipemia 06/13/2008  . JAW PAIN 06/13/2008  . PRECORDIAL PAIN 06/13/2008    Past Surgical History:  Procedure Laterality Date  . BACK SURGERY    . CARDIAC CATHETERIZATION  2010   Stent  . I&D EXTREMITY Left 11/10/2012   Procedure: IRRIGATION AND DEBRIDEMENT Left third-long finger ;  Surgeon: Jolyn Nap, MD;  Location: [place];  Service: Orthopedics;  Laterality: Left;  . LOBECTOMY  2000   Right lower for squamous cell ca  .  LUMBAR LAMINECTOMY  1978  . Lymph node resected  1983   Axilla  . Needle removed from the right foot    . Spontaneous pneumothrax  1979    Home Medications    Prior to Admission medications   Medication Sig Start Date End Date Taking? Authorizing Provider  amoxicillin-clavulanate (AUGMENTIN) 875-125 MG tablet Take 1 tablet by mouth every 12 (twelve) hours. Please take with a meal 01/12/15   Lily Kocher, PA-C  aspirin EC 81 MG tablet Take 81 mg by mouth daily.    Historical Provider, MD    dexamethasone (DECADRON) 4 MG tablet Take 1 tablet (4 mg total) by mouth 2 (two) times daily with a meal. 01/21/15   Lily Kocher, PA-C  HYDROcodone-acetaminophen (NORCO/VICODIN) 5-325 MG tablet Take 1 tablet by mouth every 4 (four) hours as needed. Patient taking differently: Take 1 tablet by mouth every 4 (four) hours as needed for moderate pain or severe pain.  01/21/15   Lily Kocher, PA-C  losartan (COZAAR) 25 MG tablet Take 1 tablet (25 mg total) by mouth daily. 12/04/15   Minus Breeding, MD  magnesium oxide (MAG-OX) 400 MG tablet Take 400 mg by mouth daily.    Historical Provider, MD  Misc Natural Products (OSTEO BI-FLEX JOINT SHIELD PO) Take 1 tablet by mouth 2 (two) times daily.    Historical Provider, MD  Omega-3 Fatty Acids (FISH OIL) 1000 MG CAPS Take 3 capsules by mouth daily.    Historical Provider, MD  Tamsulosin HCl (FLOMAX) 0.4 MG CAPS Take 0.4 mg by mouth daily after supper.     Historical Provider, MD    Family History Family History  Problem Relation Age of Onset  . Diabetes Father   . Alcohol abuse Father   . Heart disease Neg Hx     Social History Social History  Substance Use Topics  . Smoking status: Former Smoker    Quit date: 02/16/1984  . Smokeless tobacco: Never Used     Comment: Smoked for 30 years  . Alcohol use Yes     Comment: wine with supper   Allergies   Patient has no known allergies.  Review of Systems Review of Systems  Constitutional: Negative for appetite change and fatigue.  HENT: Negative for congestion, ear discharge and sinus pressure.   Eyes: Negative for discharge.  Respiratory: Negative for cough.   Cardiovascular: Negative for chest pain.  Gastrointestinal: Positive for abdominal pain. Negative for diarrhea and vomiting.  Genitourinary: Negative for frequency and hematuria.  Musculoskeletal: Negative for back pain.  Skin: Negative for rash.  Neurological: Negative for seizures and headaches.  Psychiatric/Behavioral: Negative  for hallucinations.   Physical Exam Updated Vital Signs BP 123/71 (BP Location: Left Arm)   Pulse 73   Temp 97.9 F (36.6 C) (Oral)   Resp 18   Ht '5\' 7"'$  (1.702 m)   Wt 147 lb (66.7 kg)   SpO2 97%   BMI 23.02 kg/m   Physical Exam  Constitutional: He is oriented to person, place, and time. He appears well-developed.  HENT:  Head: Normocephalic.  Eyes: Conjunctivae and EOM are normal. No scleral icterus.  Neck: Neck supple. No thyromegaly present.  Cardiovascular: Normal rate and regular rhythm.  Exam reveals no gallop and no friction rub.   No murmur heard. Pulmonary/Chest: No stridor. He has no wheezes. He has no rales. He exhibits no tenderness.  Abdominal: He exhibits no distension. There is no tenderness. There is no rebound.  Musculoskeletal: Normal range of  motion. He exhibits no edema.  Lymphadenopathy:    He has no cervical adenopathy.  Neurological: He is oriented to person, place, and time. He exhibits normal muscle tone. Coordination normal.  Skin: No rash noted. No erythema.  Psychiatric: He has a normal mood and affect. His behavior is normal.   ED Treatments / Results  Labs (all labs ordered are listed, but only abnormal results are displayed) Labs Reviewed  CBC  LIPASE, BLOOD  COMPREHENSIVE METABOLIC PANEL  URINALYSIS, ROUTINE W REFLEX MICROSCOPIC (NOT AT [facility])    EKG  EKG Interpretation None       Radiology No results found.  Procedures Procedures (including critical care time)  Medications Ordered in ED Medications - No data to display  DIAGNOSTIC STUDIES:  Oxygen Saturation is 97% on RA, normal by my interpretation.    COORDINATION OF CARE:  10:01 AM Discussed treatment Austin with pt at bedside and pt agreed to Austin.  Initial Impression / Assessment and Austin / ED Course  I have reviewed the triage vital signs and the nursing notes.  Pertinent labs & imaging results that were available during my care of the patient were reviewed by me  and considered in my medical decision making (see chart for details).  Clinical Course    Pt with nl labs and ct.  Pt to be referred to gi  I personally performed the services described in this documentation, which was scribed in my presence. The recorded information has been reviewed and is accurate.   Final Clinical Impressions(s) / ED Diagnoses   Final diagnoses:  None    New Prescriptions New Prescriptions   No medications on file  The chart was scribed for me under my direct supervision.  I personally performed the history, physical, and medical decision making and all procedures in the evaluation of this patient.[name], MD 12/22/15 1320

## 2015-12-22 NOTE — ED Notes (Signed)
Complain of pain in lower right quad that comes and goes.

## 2015-12-22 NOTE — ED Notes (Signed)
Pt taken to ct 

## 2016-01-13 ENCOUNTER — Encounter: Payer: Self-pay | Admitting: Thoracic Surgery (Cardiothoracic Vascular Surgery)

## 2016-01-13 ENCOUNTER — Other Ambulatory Visit: Payer: Medicare Other

## 2016-01-13 ENCOUNTER — Ambulatory Visit (INDEPENDENT_AMBULATORY_CARE_PROVIDER_SITE_OTHER): Payer: Medicare Other | Admitting: Thoracic Surgery (Cardiothoracic Vascular Surgery)

## 2016-01-13 VITALS — BP 122/64 | HR 77 | Resp 20 | Ht 67.0 in | Wt 147.0 lb

## 2016-01-13 DIAGNOSIS — I2583 Coronary atherosclerosis due to lipid rich plaque: Secondary | ICD-10-CM

## 2016-01-13 DIAGNOSIS — I712 Thoracic aortic aneurysm, without rupture: Secondary | ICD-10-CM | POA: Diagnosis not present

## 2016-01-13 DIAGNOSIS — I2510 Atherosclerotic heart disease of native coronary artery without angina pectoris: Secondary | ICD-10-CM

## 2016-01-13 DIAGNOSIS — I7121 Aneurysm of the ascending aorta, without rupture: Secondary | ICD-10-CM

## 2016-01-13 NOTE — Progress Notes (Signed)
[address]       [place] [address]             [phone]       HPI: Brian Austin returns today for follow-up regarding his ascending aneurysm.  Brian Austin is an 82 year old man with a past medical history of coronary artery disease, ascending thoracic aneurysm, type 2 diabetes, hypertension, hyperlipidemia, squamous cell carcinoma of the lung, and gastroesophageal reflux. He was first noted to have a 4.8-4.9 cm ascending aneurysm on a CT scan in 2010. Review of his past CT showed a CT in 2004 where the ascending aorta was the same size. We have been following him on a regular basis since 2010 area and I last saw him in the office in November 2016.  In the interim since his last visit he's been doing well. He denies chest pain, pressure, or tightness. He has been having some problems with right flank and groin pain. 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 His exercise tolerance is unchanged. He has occasional palpitations. He has had some episodes of dizziness. He checks his blood pressure regular basis and it has been well-controlled.   Past Medical History:  Diagnosis Date  . Arthritis   . BPH (benign prostatic hyperplasia)   . CAD (coronary artery disease)    s/p Xience stent ot the distal RAC 2010, PCI with Xience stent to distal RCA at the [facility] 2011  . COPD (chronic obstructive pulmonary disease) ([place]) 05/22/2008   FEV1/FVS 44.9  . Diabetes mellitus    Well controlled  . GERD (gastroesophageal reflux disease)   . Hyperlipidemia   . Hypertension   . Spontaneous pneumothorax   . 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 Thoracic ascending aortic aneurysm (HCC)    4.8 cm   . Type II or unspecified type diabetes mellitus without mention of complication, not stated as uncontrolled      Current Outpatient Prescriptions  Medication Sig Dispense Refill  . aspirin EC  81 MG tablet Take 81 mg by mouth daily.    Marland Kitchen losartan (COZAAR) 25 MG tablet Take 1 tablet (25 mg total) by mouth daily. 30 tablet 11  . magnesium oxide (MAG-OX) 400 MG tablet Take 400 mg by mouth daily.    . Omega-3 Fatty Acids (FISH OIL) 1000 MG CAPS Take 3 capsules by mouth daily.    . Tamsulosin HCl (FLOMAX) 0.4 MG CAPS Take 0.4 mg by mouth daily after supper.      No current facility-administered medications for this visit.     Physical Exam BP 122/64   Pulse 77   Resp 20   Ht '5\' 7"'$  (1.702 m)   Wt 147 lb (66.7 kg)   SpO2 95% Comment: RA  BMI 23.105 kg/m  82 year old man in no acute distress Alert and oriented 3 with no focal deficits Hard of hearing No carotid bruits Cardiac regular rate and rhythm normal S1 and S2, no murmurs The lungs clear with no wheezing 2+ pulses throughout  Diagnostic Tests: CT ANGIOGRAPHY CHEST WITH CONTRAST  TECHNIQUE: Multidetector CT imaging of the chest was performed using the standard protocol during bolus administration of intravenous contrast. Multiplanar CT image reconstructions including MIPs were obtained to evaluate the vascular anatomy.  CONTRAST:  122m OMNIPAQUE IOHEXOL 350 MG/ML SOLN  COMPARISON:  12/28/2011  FINDINGS: Aneurysmal dilatation of the ascending  thoracic aorta again noted measuring up to 4.8 cm, stable since prior study. Aortic arch and descending thoracic calcifications without aneurysm. Heart is borderline in size. Coronary artery calcifications. No filling defects in the pulmonary arteries to suggest pulmonary emboli.  No mediastinal, hilar, or axillary adenopathy. Visualized thyroid and chest wall soft tissues unremarkable.  Postsurgical changes in the right lower lobe. No suspicious pulmonary nodules. No pleural effusions.  Imaging into the upper abdomen shows no acute findings.  No acute bony abnormality. Diffuse degenerative changes in the thoracic spine.  Review of the MIP images confirms  the above findings.  IMPRESSION: No suspicious pulmonary nodule. No recurrent mass. No acute findings.  No evidence of pulmonary embolus.   Electronically Signed   By: Rolm Baptise   On: 10/14/2012 21:10  Impression: 82 year old man with a 4.9 cm ascending aneurysm that has been stable for 13 years now. There is no indication for surgery at this point in time. I discussed possibly change in 6 month intervals for his follow-up, but he wishes to stay at 1 year since his been no change over the past 13 years.  He does have hypertension which is being managed by Dr. Percival Spanish. His blood pressure was within normal limits today.   Plan: Return in one year with CT echocardiogram of chest  He knows to seek immediate medical care for severe chest or back pain  Melrose Nakayama, MD [facility] [phone]

## 2016-05-07 ENCOUNTER — Other Ambulatory Visit (HOSPITAL_COMMUNITY): Payer: Self-pay | Admitting: Nurse Practitioner

## 2016-05-07 ENCOUNTER — Ambulatory Visit (HOSPITAL_COMMUNITY)
Admission: RE | Admit: 2016-05-07 | Discharge: 2016-05-07 | Disposition: A | Discharge status: Home / Self Care | Payer: Medicare Other | Source: Ambulatory Visit | Attending: Nurse Practitioner | Admitting: Nurse Practitioner

## 2016-05-07 DIAGNOSIS — M79641 Pain in right hand: Secondary | ICD-10-CM | POA: Diagnosis present

## 2016-05-07 DIAGNOSIS — I739 Peripheral vascular disease, unspecified: Secondary | ICD-10-CM | POA: Insufficient documentation

## 2016-05-07 DIAGNOSIS — M25531 Pain in right wrist: Secondary | ICD-10-CM

## 2016-05-07 DIAGNOSIS — M19031 Primary osteoarthritis, right wrist: Secondary | ICD-10-CM | POA: Diagnosis not present

## 2016-05-08 ENCOUNTER — Emergency Department (HOSPITAL_COMMUNITY)
Admission: EM | Admit: 2016-05-08 | Discharge: 2016-05-08 | Disposition: A | Discharge status: Home / Self Care | Payer: Medicare Other | Attending: Emergency Medicine | Admitting: Emergency Medicine

## 2016-05-08 ENCOUNTER — Encounter (HOSPITAL_COMMUNITY): Payer: Self-pay | Admitting: Emergency Medicine

## 2016-05-08 DIAGNOSIS — Z79899 Other long term (current) drug therapy: Secondary | ICD-10-CM | POA: Insufficient documentation

## 2016-05-08 DIAGNOSIS — I2510 Atherosclerotic heart disease of native coronary artery without angina pectoris: Secondary | ICD-10-CM | POA: Diagnosis not present

## 2016-05-08 DIAGNOSIS — Z7982 Long term (current) use of aspirin: Secondary | ICD-10-CM | POA: Diagnosis not present

## 2016-05-08 DIAGNOSIS — I10 Essential (primary) hypertension: Secondary | ICD-10-CM | POA: Diagnosis not present

## 2016-05-08 DIAGNOSIS — Z87891 Personal history of nicotine dependence: Secondary | ICD-10-CM | POA: Insufficient documentation

## 2016-05-08 DIAGNOSIS — E119 Type 2 diabetes mellitus without complications: Secondary | ICD-10-CM | POA: Diagnosis not present

## 2016-05-08 DIAGNOSIS — Z7984 Long term (current) use of oral hypoglycemic drugs: Secondary | ICD-10-CM | POA: Insufficient documentation

## 2016-05-08 DIAGNOSIS — M25531 Pain in right wrist: Secondary | ICD-10-CM

## 2016-05-08 DIAGNOSIS — L03113 Cellulitis of right upper limb: Secondary | ICD-10-CM | POA: Diagnosis not present

## 2016-05-08 MED ORDER — SULFAMETHOXAZOLE-TRIMETHOPRIM 800-160 MG PO TABS
1.0000 | ORAL_TABLET | Freq: Once | ORAL | Status: AC
  Administered 2016-05-08: 1 via ORAL
  Filled 2016-05-08: qty 1

## 2016-05-08 MED ORDER — PREDNISONE 10 MG (21) PO TBPK: ORAL_TABLET | ORAL | 0 refills | Status: DC

## 2016-05-08 MED ORDER — DEXAMETHASONE SODIUM PHOSPHATE 10 MG/ML IJ SOLN
10.0000 mg | Freq: Once | INTRAMUSCULAR | Status: AC
  Administered 2016-05-08: 10 mg via INTRAMUSCULAR
  Filled 2016-05-08: qty 1

## 2016-05-08 MED ORDER — SULFAMETHOXAZOLE-TRIMETHOPRIM 800-160 MG PO TABS: 1.0000 | ORAL_TABLET | Freq: Two times a day (BID) | ORAL | 0 refills | Status: AC

## 2016-05-08 MED ORDER — TRAMADOL HCL 50 MG PO TABS: 50.0000 mg | ORAL_TABLET | Freq: Four times a day (QID) | ORAL | 0 refills | Status: DC | PRN

## 2016-05-08 NOTE — ED Triage Notes (Signed)
Pt reports R wrist pain, swelling, and redness since waking on Wednesday. Denies injury. Decrease movement. X-rayed here yesterday, did not receive results.

## 2016-05-08 NOTE — ED Provider Notes (Signed)
[facility] DEPT Provider Note   CSN: [id number] Arrival date & time: 05/08/16  1333  By signing my name below, I, [name], attest that this documentation has been prepared under the direction and in the presence of Isla Pence, MD . Electronically Signed: [name], Scribe. 05/08/2016. 1:50 PM.  History   Chief Complaint Chief Complaint  Patient presents with  . Wrist Pain    right   The history is provided by the patient. No language interpreter was used.   HPI Comments: Brian Austin is a 83 y.o. male with PMHx of DM2 and HTN, who presents to the Emergency Department complaining of gradually worsening, 3/10, right wrist pain/redness that began ~3 days ago after waking up. Pt reports his pain is exacerbated with movement of his wrist. He notes associated right wrist swelling and redness that radiates up his left forearm. He denies any recent injury or other complaints.   Pt did have xrays done yesterday, but he did not receive results.  The Xrays showed degenerative changes/ no fx.  Past Medical History:  Diagnosis Date  . Arthritis   . BPH (benign prostatic hyperplasia)   . CAD (coronary artery disease)    s/p Xience stent ot the distal RAC 2010, PCI with Xience stent to distal RCA at the [facility] 2011  . COPD (chronic obstructive pulmonary disease) ([place]) 05/22/2008   FEV1/FVS 44.9  . Diabetes mellitus    Well controlled  . GERD (gastroesophageal reflux disease)   . Hyperlipidemia   . Hypertension   . Spontaneous pneumothorax   . 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 Thoracic ascending aortic aneurysm (HCC)    4.8 cm   . Type II or unspecified type diabetes mellitus without mention of complication, not stated as uncontrolled     Patient Active Problem List   Diagnosis Date Noted  . Nontraumatic incomplete tear of right rotator cuff 01/22/2013  . Pain in joint, shoulder region  01/22/2013  . Dog bite of hand 11/10/2012  . Cellulitis and abscess of hand 11/10/2012  . Hyperlipidemia   . Type II or unspecified type diabetes mellitus without mention of complication, not stated as uncontrolled   . Jaw pain   . CAD (coronary artery disease)   . ASCENDING AORTIC ANEURYSM 04/28/2009  . HEMOPTYSIS UNSPECIFIED 04/25/2009  . CHEST PAIN, ATYPICAL 04/25/2009  . LUNG CANCER, HX OF 04/25/2009  . Chronic ischemic heart disease 07/02/2008  . MITRAL REGURGITATION 06/28/2008  . Essential hypertension 06/28/2008  . COPD 06/28/2008  . CARDIOVASCULAR FUNCTION STUDY, ABNORMAL 06/19/2008  . AODM 06/13/2008  . Hyperlipemia 06/13/2008  . JAW PAIN 06/13/2008  . PRECORDIAL PAIN 06/13/2008    Past Surgical History:  Procedure Laterality Date  . BACK SURGERY    . CARDIAC CATHETERIZATION  2010   Stent  . I&D EXTREMITY Left 11/10/2012   Procedure: IRRIGATION AND DEBRIDEMENT Left third-long finger ;  Surgeon: Jolyn Nap, MD;  Location: [place];  Service: Orthopedics;  Laterality: Left;  . LOBECTOMY  2000   Right lower for squamous cell ca  . LUMBAR LAMINECTOMY  1978  . Lymph node resected  1983   Axilla  . Needle removed from the right foot    . Spontaneous pneumothrax  1979    Home Medications    Prior to Admission medications   Medication Sig Start Date End Date Taking? Authorizing Provider  aspirin EC  81 MG tablet Take 81 mg by mouth daily.    Historical Provider, MD  losartan (COZAAR) 25 MG tablet Take 1 tablet (25 mg total) by mouth daily. 12/04/15   Minus Breeding, MD  magnesium oxide (MAG-OX) 400 MG tablet Take 400 mg by mouth daily.    Historical Provider, MD  Omega-3 Fatty Acids (FISH OIL) 1000 MG CAPS Take 3 capsules by mouth daily.    Historical Provider, MD  predniSONE (STERAPRED UNI-PAK 21 TAB) 10 MG (21) TBPK tablet Take 6 tabs by mouth daily  for 2 days, then 5 tabs for 2 days, then 4 tabs for 2 days, then 3 tabs for 2 days, 2 tabs for 2 days, then 1 tab by  mouth daily for 2 days 05/08/16   Isla Pence, MD  sulfamethoxazole-trimethoprim (BACTRIM DS,SEPTRA DS) 800-160 MG tablet Take 1 tablet by mouth 2 (two) times daily. 05/08/16 05/15/16  Isla Pence, MD  Tamsulosin HCl (FLOMAX) 0.4 MG CAPS Take 0.4 mg by mouth daily after supper.     Historical Provider, MD  traMADol (ULTRAM) 50 MG tablet Take 1 tablet (50 mg total) by mouth every 6 (six) hours as needed. 05/08/16   Isla Pence, MD    Family History Family History  Problem Relation Age of Onset  . Diabetes Father   . Alcohol abuse Father   . Heart disease Neg Hx     Social History Social History  Substance Use Topics  . Smoking status: Former Smoker    Quit date: 02/16/1984  . Smokeless tobacco: Never Used     Comment: Smoked for 30 years  . Alcohol use Yes     Comment: wine with supper   Allergies   Patient has no known allergies.  Review of Systems Review of Systems  Musculoskeletal: Positive for arthralgias and joint swelling.  Skin: Positive for color change.  All other systems reviewed and are negative.  Physical Exam Updated Vital Signs BP (!) 150/67 (BP Location: Left Arm)   Pulse 81   Temp 98.5 F (36.9 C) (Oral)   Resp 18   Ht '5\' 7"'$  (1.702 m)   Wt 147 lb (66.7 kg)   SpO2 99%   BMI 23.02 kg/m   Physical Exam  Constitutional: He is oriented to person, place, and time. He appears well-developed and well-nourished.  HENT:  Head: Normocephalic and atraumatic.  Eyes: EOM are normal.  Neck: Normal range of motion.  Cardiovascular: Normal rate, regular rhythm, normal heart sounds and intact distal pulses.   Pulmonary/Chest: Effort normal and breath sounds normal. No respiratory distress.  Abdominal: Soft. He exhibits no distension. There is no tenderness.  Musculoskeletal: He exhibits tenderness.  Right wrist on palmar side is tender, erythematous, and swollen. Minimal lymphangiitis.   Neurological: He is alert and oriented to person, place, and time.    Skin: Skin is warm and dry.  Psychiatric: He has a normal mood and affect. Judgment normal.  Nursing note and vitals reviewed.  ED Treatments / Results  DIAGNOSTIC STUDIES:  Oxygen Saturation is 99% on RA, normal by my interpretation.    COORDINATION OF CARE:  1:47 PM Discussed treatment plan with pt at bedside and pt agreed to plan.  Labs (all labs ordered are listed, but only abnormal results are displayed) Labs Reviewed - No data to display  EKG  EKG Interpretation None      Radiology Dg Wrist Complete Right  Result Date: 05/07/2016 CLINICAL DATA:  Pain and swelling. EXAM: RIGHT WRIST -  COMPLETE 3+ VIEW COMPARISON:  05/07/2016 FINDINGS: Diffuse degenerative change. Carpal c and ulnar cysts present are most likely degenerative. No fracture dislocation. Vascular calcification noted. IMPRESSION: 1.  Diffuse degenerative change.  No acute bony abnormality. 2. Vascular calcification consistent peripheral vascular disease noted . Electronically Signed   By: Marcello Moores  Register   On: 05/07/2016 13:50   Dg Hand Complete Right  Result Date: 05/07/2016 CLINICAL DATA:  Pain and swelling. EXAM: RIGHT HAND - COMPLETE 3+ VIEW COMPARISON:  11/14/2012. FINDINGS: Diffuse degenerative change. Carpal ulnar cysts noted most likely degenerative. No acute bony or joint abnormality identified. No evidence of fracture or dislocation. Peripheral vascular calcification . IMPRESSION: 1. Degenerative changes right hand and wrist. No acute bony abnormality. 2. Peripheral vascular disease. Electronically Signed   By: Marcello Moores  Register   On: 05/07/2016 13:51    Procedures Procedures (including critical care time)  Medications Ordered in ED Medications  sulfamethoxazole-trimethoprim (BACTRIM DS,SEPTRA DS) 800-160 MG per tablet 1 tablet (1 tablet Oral Given 05/08/16 1358)  dexamethasone (DECADRON) injection 10 mg (10 mg Intramuscular Given 05/08/16 1358)    Initial Impression / Assessment and Plan / ED  Course  I have reviewed the triage vital signs and the nursing notes.  Pt to be treated for cellulitis and arthritis.  Pt knows to return if the redness worsens.    Pertinent labs & imaging results that were available during my care of the patient were reviewed by me and considered in my medical decision making (see chart for details).    I personally performed the services described in this documentation, which was scribed in my presence. The recorded information has been reviewed and is accurate.   Final Clinical Impressions(s) / ED Diagnoses   Final diagnoses:  Right wrist pain  Cellulitis of right upper extremity    New Prescriptions New Prescriptions   PREDNISONE (STERAPRED UNI-PAK 21 TAB) 10 MG (21) TBPK TABLET    Take 6 tabs by mouth daily  for 2 days, then 5 tabs for 2 days, then 4 tabs for 2 days, then 3 tabs for 2 days, 2 tabs for 2 days, then 1 tab by mouth daily for 2 days   SULFAMETHOXAZOLE-TRIMETHOPRIM (BACTRIM DS,SEPTRA DS) 800-160 MG TABLET    Take 1 tablet by mouth 2 (two) times daily.   TRAMADOL (ULTRAM) 50 MG TABLET    Take 1 tablet (50 mg total) by mouth every 6 (six) hours as needed.     Isla Pence, MD 05/08/16 [phone]

## 2016-08-20 ENCOUNTER — Other Ambulatory Visit (HOSPITAL_COMMUNITY): Payer: Self-pay | Admitting: Nurse Practitioner

## 2016-08-20 DIAGNOSIS — R0989 Other specified symptoms and signs involving the circulatory and respiratory systems: Secondary | ICD-10-CM

## 2016-08-30 ENCOUNTER — Ambulatory Visit (HOSPITAL_COMMUNITY)
Admission: RE | Admit: 2016-08-30 | Discharge: 2016-08-30 | Disposition: A | Discharge status: Home / Self Care | Payer: Medicare Other | Source: Ambulatory Visit | Attending: Nurse Practitioner | Admitting: Nurse Practitioner

## 2016-08-30 DIAGNOSIS — R0989 Other specified symptoms and signs involving the circulatory and respiratory systems: Secondary | ICD-10-CM

## 2016-09-11 ENCOUNTER — Emergency Department (HOSPITAL_COMMUNITY)
Admission: EM | Admit: 2016-09-11 | Discharge: 2016-09-11 | Disposition: A | Discharge status: Home / Self Care | Payer: Medicare Other | Attending: Emergency Medicine | Admitting: Emergency Medicine

## 2016-09-11 ENCOUNTER — Encounter (HOSPITAL_COMMUNITY): Payer: Self-pay | Admitting: Emergency Medicine

## 2016-09-11 DIAGNOSIS — E119 Type 2 diabetes mellitus without complications: Secondary | ICD-10-CM | POA: Diagnosis not present

## 2016-09-11 DIAGNOSIS — Y929 Unspecified place or not applicable: Secondary | ICD-10-CM | POA: Insufficient documentation

## 2016-09-11 DIAGNOSIS — Y939 Activity, unspecified: Secondary | ICD-10-CM | POA: Insufficient documentation

## 2016-09-11 DIAGNOSIS — L309 Dermatitis, unspecified: Secondary | ICD-10-CM | POA: Diagnosis not present

## 2016-09-11 DIAGNOSIS — L539 Erythematous condition, unspecified: Secondary | ICD-10-CM | POA: Diagnosis present

## 2016-09-11 DIAGNOSIS — Y999 Unspecified external cause status: Secondary | ICD-10-CM | POA: Diagnosis not present

## 2016-09-11 DIAGNOSIS — I10 Essential (primary) hypertension: Secondary | ICD-10-CM | POA: Diagnosis not present

## 2016-09-11 DIAGNOSIS — Z7982 Long term (current) use of aspirin: Secondary | ICD-10-CM | POA: Insufficient documentation

## 2016-09-11 DIAGNOSIS — J449 Chronic obstructive pulmonary disease, unspecified: Secondary | ICD-10-CM | POA: Insufficient documentation

## 2016-09-11 DIAGNOSIS — Z87891 Personal history of nicotine dependence: Secondary | ICD-10-CM | POA: Diagnosis not present

## 2016-09-11 DIAGNOSIS — I2510 Atherosclerotic heart disease of native coronary artery without angina pectoris: Secondary | ICD-10-CM | POA: Insufficient documentation

## 2016-09-11 DIAGNOSIS — Z79899 Other long term (current) drug therapy: Secondary | ICD-10-CM | POA: Diagnosis not present

## 2016-09-11 DIAGNOSIS — T63441A Toxic effect of venom of bees, accidental (unintentional), initial encounter: Secondary | ICD-10-CM

## 2016-09-11 DIAGNOSIS — R21 Rash and other nonspecific skin eruption: Secondary | ICD-10-CM

## 2016-09-11 DIAGNOSIS — W57XXXA Bitten or stung by nonvenomous insect and other nonvenomous arthropods, initial encounter: Secondary | ICD-10-CM | POA: Insufficient documentation

## 2016-09-11 MED ORDER — AMOXICILLIN-POT CLAVULANATE 875-125 MG PO TABS: 1.0000 | ORAL_TABLET | Freq: Two times a day (BID) | ORAL | 0 refills | Status: DC

## 2016-09-11 NOTE — ED Notes (Signed)
bs 117 this am

## 2016-09-11 NOTE — ED Triage Notes (Signed)
Pt stung by 3 bees Thursday. States here today bc it keeps iching and is still swollen to left wrist. Pt has mild swelling noted to left wrist. Pt denies at any time that he had sob/oral swelling. Nad.

## 2016-09-11 NOTE — ED Provider Notes (Signed)
[facility] DEPT Provider Note   CSN: [id number] Arrival date & time: 09/11/16  1121     History   Chief Complaint Chief Complaint  Patient presents with  . Insect Bite    HPI Brian Austin is a 83 y.o. male.  HPI Patient presents to ED for evaluation of 3 bee stings on left wrist that occurred 3 days ago. He reports some itching and mild swelling to the wrist that has improved. He has not tried any medications prior to arrival. He denies any lip swelling, trouble breathing or trouble swallowing. Does not have a history of infected insect bites or allergic reactions to bites. He denies any fever, chills, trouble moving the hand, numbness or injury to the area.  Past Medical History:  Diagnosis Date  . Arthritis   . BPH (benign prostatic hyperplasia)   . CAD (coronary artery disease)    s/p Xience stent ot the distal RAC 2010, PCI with Xience stent to distal RCA at the [facility] 2011  . COPD (chronic obstructive pulmonary disease) ([place]) 05/22/2008   FEV1/FVS 44.9  . Diabetes mellitus    Well controlled  . GERD (gastroesophageal reflux disease)   . Hyperlipidemia   . Hypertension   . Spontaneous pneumothorax   . Squamous cell carcinoma lung ([place]) 2000   CT w/ CM; 04/25/2009 COPD, s/p RLL, CAD, 5 cm ascending aortic aneurysm  . Thoracic ascending aortic aneurysm (HCC)    4.8 cm   . Type II or unspecified type diabetes mellitus without mention of complication, not stated as uncontrolled     Patient Active Problem List   Diagnosis Date Noted  . Nontraumatic incomplete tear of right rotator cuff 01/22/2013  . Pain in joint, shoulder region 01/22/2013  . Dog bite of hand 11/10/2012  . Cellulitis and abscess of hand 11/10/2012  . Hyperlipidemia   . Type II or unspecified type diabetes mellitus without mention of complication, not stated as uncontrolled   . Jaw pain   . CAD (coronary artery disease)   . ASCENDING AORTIC ANEURYSM 04/28/2009  . HEMOPTYSIS UNSPECIFIED  04/25/2009  . CHEST PAIN, ATYPICAL 04/25/2009  . LUNG CANCER, HX OF 04/25/2009  . Chronic ischemic heart disease 07/02/2008  . MITRAL REGURGITATION 06/28/2008  . Essential hypertension 06/28/2008  . COPD 06/28/2008  . CARDIOVASCULAR FUNCTION STUDY, ABNORMAL 06/19/2008  . AODM 06/13/2008  . Hyperlipemia 06/13/2008  . JAW PAIN 06/13/2008  . PRECORDIAL PAIN 06/13/2008    Past Surgical History:  Procedure Laterality Date  . BACK SURGERY    . CARDIAC CATHETERIZATION  2010   Stent  . I&D EXTREMITY Left 11/10/2012   Procedure: IRRIGATION AND DEBRIDEMENT Left third-long finger ;  Surgeon: Jolyn Nap, MD;  Location: [facility];  Service: Orthopedics;  Laterality: Left;  . LOBECTOMY  2000   Right lower for squamous cell ca  . LUMBAR LAMINECTOMY  1978  . Lymph node resected  1983   Axilla  . Needle removed from the right foot    . Spontaneous pneumothrax  1979       Home Medications    Prior to Admission medications   Medication Sig Start Date End Date Taking? Authorizing Provider  amoxicillin-clavulanate (AUGMENTIN) 875-125 MG tablet Take 1 tablet by mouth every 12 (twelve) hours. 09/11/16   Delia Heady, PA-C  aspirin EC 81 MG tablet Take 81 mg by mouth daily.    [provider]  losartan (COZAAR) 25 MG tablet Take 1 tablet (25 mg total) by mouth  daily. 12/04/15   Minus Breeding, MD  magnesium oxide (MAG-OX) 400 MG tablet Take 400 mg by mouth daily.    [provider]  Omega-3 Fatty Acids (FISH OIL) 1000 MG CAPS Take 3 capsules by mouth daily.    [provider]  predniSONE (STERAPRED UNI-PAK 21 TAB) 10 MG (21) TBPK tablet Take 6 tabs by mouth daily  for 2 days, then 5 tabs for 2 days, then 4 tabs for 2 days, then 3 tabs for 2 days, 2 tabs for 2 days, then 1 tab by mouth daily for 2 days 05/08/16   Isla Pence, MD  Tamsulosin HCl (FLOMAX) 0.4 MG CAPS Take 0.4 mg by mouth daily after supper.     [provider]  traMADol (ULTRAM) 50 MG tablet  Take 1 tablet (50 mg total) by mouth every 6 (six) hours as needed. 05/08/16   Isla Pence, MD    Family History Family History  Problem Relation Age of Onset  . Diabetes Father   . Alcohol abuse Father   . Heart disease Neg Hx     Social History Social History  Substance Use Topics  . Smoking status: Former Smoker    Quit date: 02/16/1984  . Smokeless tobacco: Never Used     Comment: Smoked for 30 years  . Alcohol use Yes     Comment: wine with supper     Allergies   Patient has no known allergies.   Review of Systems Review of Systems  Constitutional: Negative for chills and fever.  HENT: Negative for facial swelling.   Respiratory: Negative for shortness of breath.   Cardiovascular: Negative for chest pain.  Gastrointestinal: Negative for nausea and vomiting.  Musculoskeletal: Negative for arthralgias and myalgias.  Skin: Positive for color change and wound.     Physical Exam Updated Vital Signs BP 130/66 (BP Location: Right Arm)   Pulse 79   Temp 98.3 F (36.8 C) (Oral)   Resp 20   SpO2 95%   Physical Exam  Constitutional: He appears well-developed and well-nourished. No distress.  HENT:  Head: Normocephalic and atraumatic.  Eyes: Conjunctivae and EOM are normal. No scleral icterus.  Neck: Normal range of motion.  Pulmonary/Chest: Effort normal. No respiratory distress.  Musculoskeletal: Normal range of motion. He exhibits edema. He exhibits no tenderness.  Erythema and 3 sting marks on left wrist. There is mild edema present. There is no tenderness to palpation noted. No streaking noted. Full active and passive range of motion of the wrist. Visible deformity noted. Sensation intact to light touch. 2+ radial pulse present.  Neurological: He is alert.  Skin: No rash noted. He is not diaphoretic.  Psychiatric: He has a normal mood and affect.  Nursing note and vitals reviewed.    ED Treatments / Results  Labs (all labs ordered are listed, but only  abnormal results are displayed) Labs Reviewed - No data to display  EKG  EKG Interpretation None       Radiology No results found.  Procedures Procedures (including critical care time)  Medications Ordered in ED Medications - No data to display   Initial Impression / Assessment and Plan / ED Course  I have reviewed the triage vital signs and the nursing notes.  Pertinent labs & imaging results that were available during my care of the patient were reviewed by me and considered in my medical decision making (see chart for details).     Patient presents to ED for evaluation of bee  stings on left wrist. He denies any previous history of allergic reaction. He denies any lip swelling, trouble breathing or trouble swallowing. Has not tried any medications prior to arrival. On physical exam there is some erythema and mild edema present in the left wrist. There is no streaking present. No swelling or tenderness of the joint. He has full active and passive range of motion of the wrist which is no low suspicion for septic joint. He is afebrile with no history of fever. Due to the timing could be either an allergic reaction or developing infection. There is no purulent drainage or drainable abscess noted. Will give Augmentin and advised him to take allergy medication or Benadryl cream as needed for itching. Encouraged him to follow-up with PCP for further evaluation if symptoms persist. Patient appears stable for discharge at this time. Strict return precautions given.  Final Clinical Impressions(s) / ED Diagnoses   Final diagnoses:  Bee sting, accidental or unintentional, initial encounter  Rash    New Prescriptions Discharge Medication List as of 09/11/2016 12:05 PM    START taking these medications   Details  amoxicillin-clavulanate (AUGMENTIN) 875-125 MG tablet Take 1 tablet by mouth every 12 (twelve) hours., Starting Sat 09/11/2016, Print         Delia Heady, PA-C 09/11/16  [place], [place], [place] 09/13/16 [id number]

## 2016-09-11 NOTE — Discharge Instructions (Signed)
Take twice daily for 1 week. Take over-the-counter allergy medication and Benadryl cream topical as needed. Follow-up with PCP for further evaluation. Return to ED for worsening pain, increased swelling, lip swelling, trouble breathing or trouble swallowing.

## 2016-09-11 NOTE — ED Notes (Signed)
Mild swelling to L wrist 3 days after multiple bee stings  Pt complaint of itching and continued swellnig

## 2016-09-11 NOTE — ED Notes (Signed)
PA in to assess 

## 2016-10-28 NOTE — Progress Notes (Signed)
HPI The patient presents for followup of his known coronary disease.  Since I last saw him he has done well.  He still takes care of many animals and has no problem with this.  He still his donkey BJ.  The patient denies any new symptoms such as chest discomfort, neck or arm discomfort. There has been no new shortness of breath, PND or orthopnea. There have been no reported palpitations, presyncope or syncope.  He is not getting the palpitations that he had previously.  He has mild dizziness but this is not as significant as it was previously.  Brian Austin  No Known Allergies  Current Outpatient Prescriptions  Medication Sig Dispense Refill  . aspirin EC 81 MG tablet Take 81 mg by mouth daily.    . magnesium oxide (MAG-OX) 400 MG tablet Take 400 mg by mouth daily.    . Omega-3 Fatty Acids (FISH OIL) 1000 MG CAPS Take 3 capsules by mouth daily.    . Tamsulosin HCl (FLOMAX) 0.4 MG CAPS Take 0.4 mg by mouth daily after supper.      No current facility-administered medications for this visit.     Past Medical History:  Diagnosis Date  . Arthritis   . BPH (benign prostatic hyperplasia)   . CAD (coronary artery disease)    s/p Xience stent ot the distal RAC 2010, PCI with Xience stent to distal RCA at the [facility] 2011  . COPD (chronic obstructive pulmonary disease) ([place]) 05/22/2008   FEV1/FVS 44.9  . Diabetes mellitus    Well controlled  . GERD (gastroesophageal reflux disease)   . Hyperlipidemia   . Hypertension   . Spontaneous pneumothorax   . 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 Thoracic ascending aortic aneurysm (HCC)    4.8 cm   . Type II or unspecified type diabetes mellitus without mention of complication, not stated as uncontrolled     Past Surgical History:  Procedure Laterality Date  . BACK SURGERY    . CARDIAC CATHETERIZATION  2010   Stent  . I&D EXTREMITY Left 11/10/2012   Procedure: IRRIGATION AND  DEBRIDEMENT Left third-long finger ;  Surgeon: Jolyn Nap, MD;  Location: [facility];  Service: Orthopedics;  Laterality: Left;  . LOBECTOMY  2000   Right lower for squamous cell ca  . LUMBAR LAMINECTOMY  1978  . Lymph node resected  1983   Axilla  . Needle removed from the right foot    . Spontaneous pneumothrax  1979    ROS:  Positive for none.Brian Austin     PHYSICAL EXAM BP (!) 148/76   Pulse (!) 59   Ht 5\' 7"  (1.702 m)   Wt 147 lb (66.7 kg)   BMI 23.02 kg/m   GENERAL:  Well appearing NECK:  No jugular venous distention, waveform within normal limits, carotid upstroke brisk and symmetric, right carotid bruits, no thyromegaly LUNGS:  Clear to auscultation bilaterally CHEST:  Unremarkable HEART:  PMI not displaced or sustained,S1 and S2 within normal limits, no S3, no S4, no clicks, no rubs, no murmurs ABD:  Flat, positive bowel sounds normal in frequency in pitch, no bruits, no rebound, no guarding, no midline pulsatile mass, no hepatomegaly, no splenomegaly EXT:  2 plus pulses throughout, no edema, no cyanosis no clubbing    Lab Results  Component Value Date   CHOL 154 10/29/2015   TRIG 70 10/29/2015  HDL 48 10/29/2015   LDLCALC 92 10/29/2015    EKG:  Sinus rhythm, rate 59, right bundle branch block, left anterior fascicular block, no acute ST-T wave changes. 10/31/2016  ASSESSMENT AND PLAN  PALPITATIONS:  These are not as problematic.  He does have RBBB but no brady related symptoms.  No change in therapy is indicated.  No further testing.   CAD:  The patient has no new sypmtoms.  No further cardiovascular testing is indicated.  We will continue with aggressive risk reduction and meds as listed.  HTN:  The blood pressure is mildly elevated but this is not usual. No change in medications is indicated. We will continue with therapeutic lifestyle changes (TLC).  AORTIC ANEURYSM:   This was 4.7 on CT last year.  This is going to be followed by Dr. Roxan Hockey soon.  I will  defer to his management.   DYSLIPIDEMIA:  This is followed by [facility]  CAROTID STENOSIS:  He had moderate left stenosis and mild right recently.  We will follow this up in one year.

## 2016-10-29 ENCOUNTER — Encounter: Payer: Self-pay | Admitting: Cardiology

## 2016-10-29 ENCOUNTER — Ambulatory Visit (INDEPENDENT_AMBULATORY_CARE_PROVIDER_SITE_OTHER): Payer: Medicare Other | Admitting: Cardiology

## 2016-10-29 VITALS — BP 148/76 | HR 59 | Ht 67.0 in | Wt 147.0 lb

## 2016-10-29 DIAGNOSIS — E785 Hyperlipidemia, unspecified: Secondary | ICD-10-CM | POA: Diagnosis not present

## 2016-10-29 DIAGNOSIS — I10 Essential (primary) hypertension: Secondary | ICD-10-CM | POA: Diagnosis not present

## 2016-10-29 DIAGNOSIS — R002 Palpitations: Secondary | ICD-10-CM | POA: Diagnosis not present

## 2016-10-29 DIAGNOSIS — I712 Thoracic aortic aneurysm, without rupture: Secondary | ICD-10-CM | POA: Diagnosis not present

## 2016-10-29 DIAGNOSIS — I6523 Occlusion and stenosis of bilateral carotid arteries: Secondary | ICD-10-CM

## 2016-10-29 DIAGNOSIS — I7121 Aneurysm of the ascending aorta, without rupture: Secondary | ICD-10-CM

## 2016-10-29 NOTE — Patient Instructions (Signed)
Your physician wants you to follow-up in: 1 Year. You will receive a reminder letter in the mail two months in advance. If you don't receive a letter, please call our office to schedule the follow-up appointment.  

## 2016-10-31 ENCOUNTER — Encounter: Payer: Self-pay | Admitting: Cardiology

## 2016-10-31 DIAGNOSIS — I6523 Occlusion and stenosis of bilateral carotid arteries: Secondary | ICD-10-CM | POA: Insufficient documentation

## 2016-11-22 IMAGING — CT CT ABD-PELV W/ CM
2 of 5 series · 16 of 46 positions shown, 18 images · IV contrast (APPLIED)
Comparison: 01/21/2015

CLINICAL DATA: Nausea, diarrhea, right lower quadrant pain starting
[REDACTED] morning at 6 a.m.

EXAM:
CT ABDOMEN AND PELVIS WITH CONTRAST
TECHNIQUE: Multidetector CT imaging of the abdomen and pelvis was performed
using the standard protocol following bolus administration of
intravenous contrast.
CONTRAST:  30mL SAB3N3-WBB IOPAMIDOL (SAB3N3-WBB) INJECTION 61%,
100mL SAB3N3-WBB IOPAMIDOL (SAB3N3-WBB) INJECTION 61%

[Series 2: axial st · axial · 0.71mm/px · z∈[-269,+81]mm · 13 of 82 slices shown, 15 images]
[im 6/82  soft-tissue]
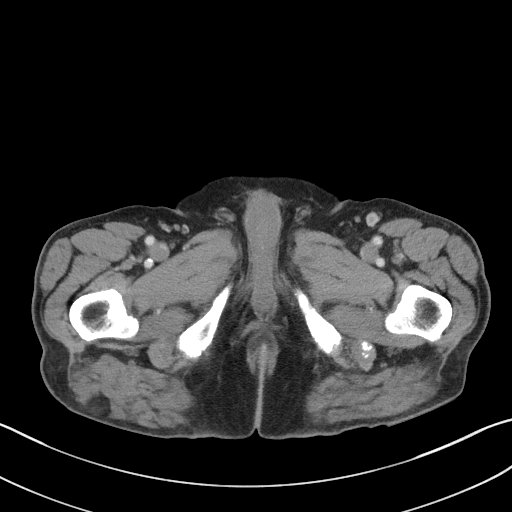
[im 6/82  bone]
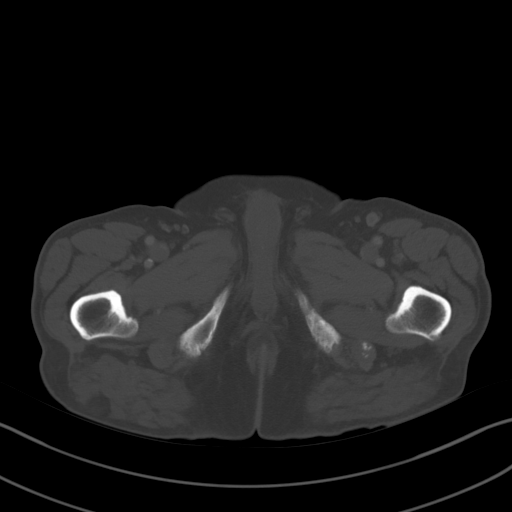
[im 11/82  soft-tissue]
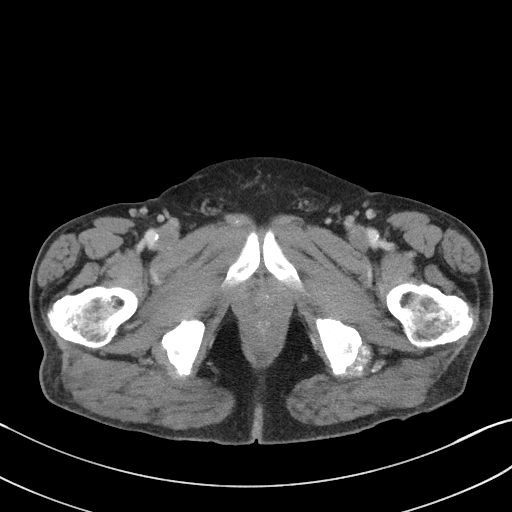
[im 16/82  soft-tissue]
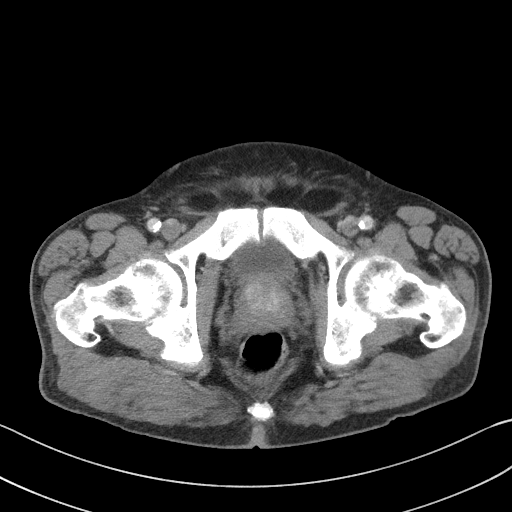
[im 26/82  soft-tissue]
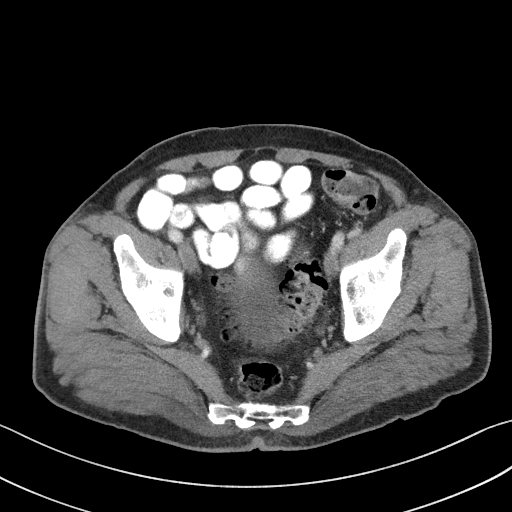
[im 31/82  soft-tissue]
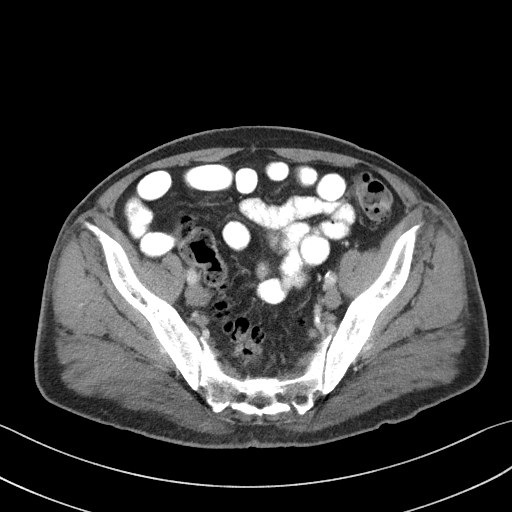
[im 36/82  soft-tissue]
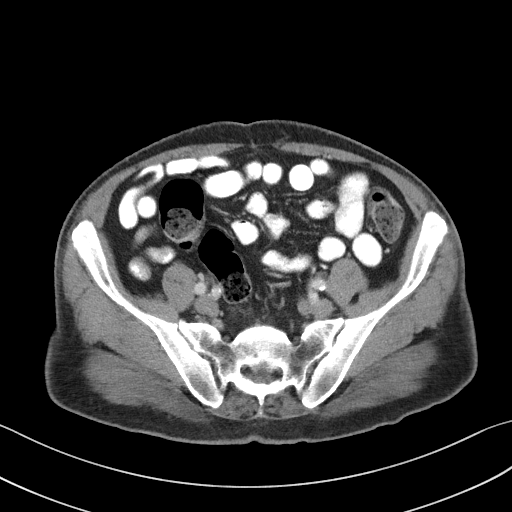
[im 41/82  soft-tissue]
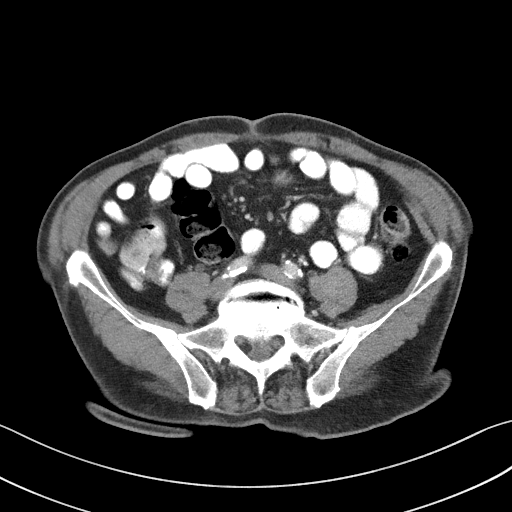
[im 46/82  soft-tissue]
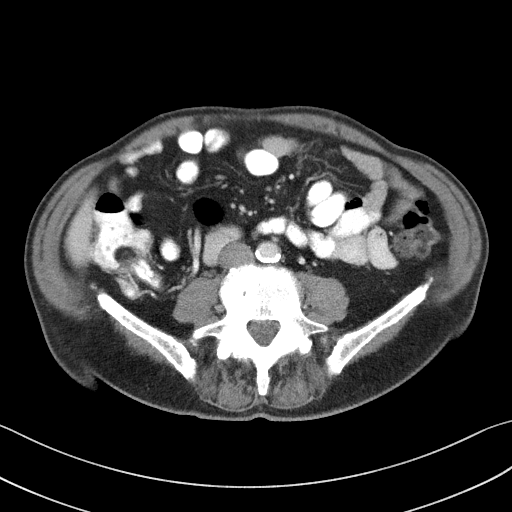
[im 51/82  soft-tissue]
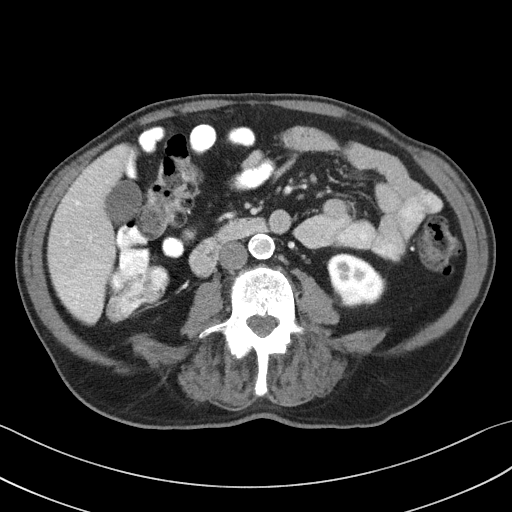
[im 51/82  bone]
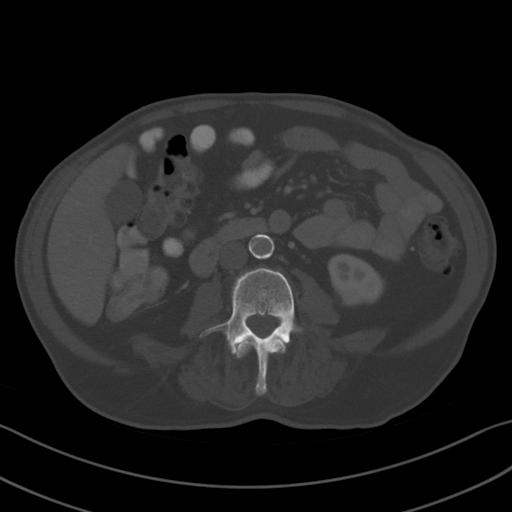
[im 56/82  soft-tissue]
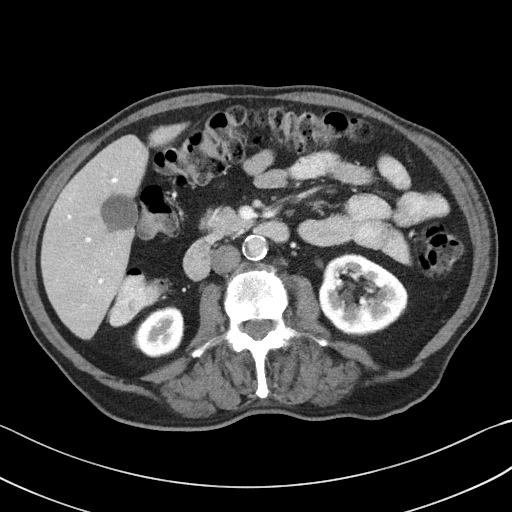
[im 66/82  soft-tissue]
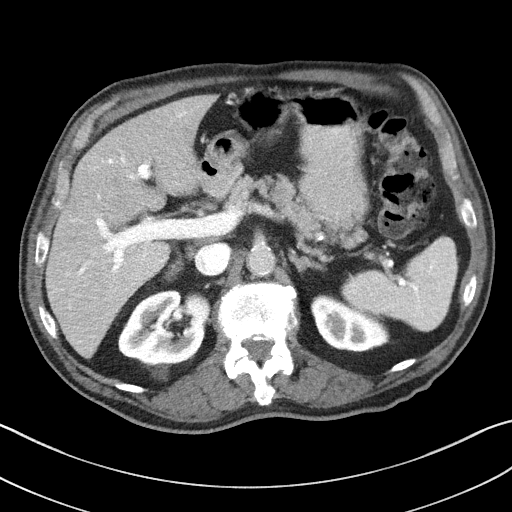
[im 71/82  soft-tissue]
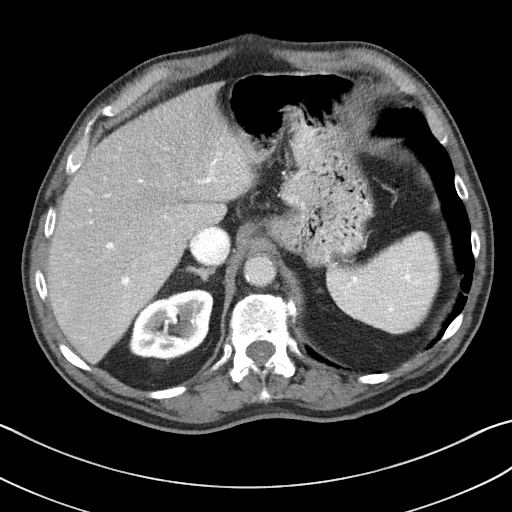
[im 76/82  soft-tissue]
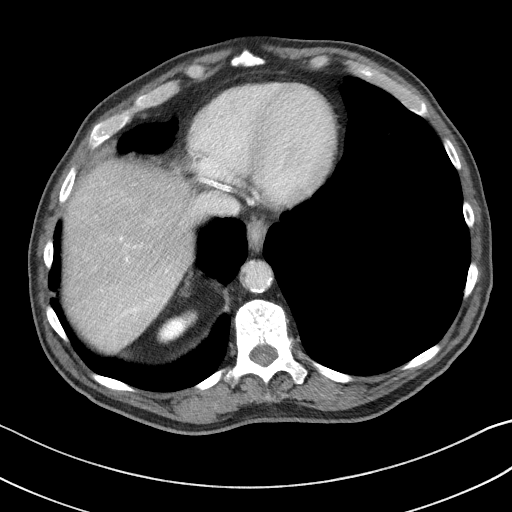

[Series 4: coronal st · coronal · 0.72mm/px · 3 of 95 slices shown]
[im 32/95  soft-tissue]
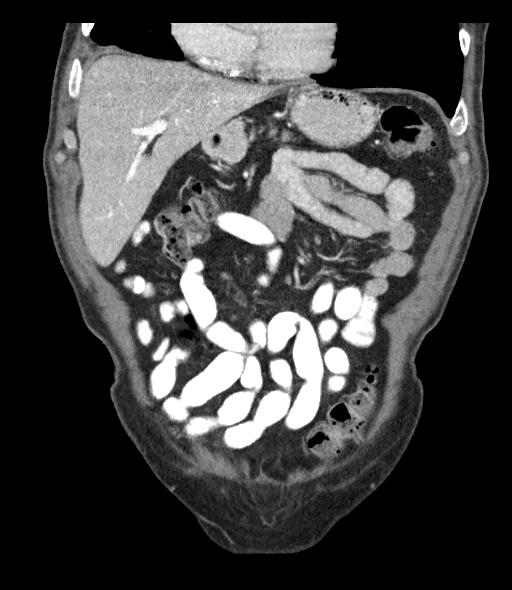
[im 42/95  soft-tissue]
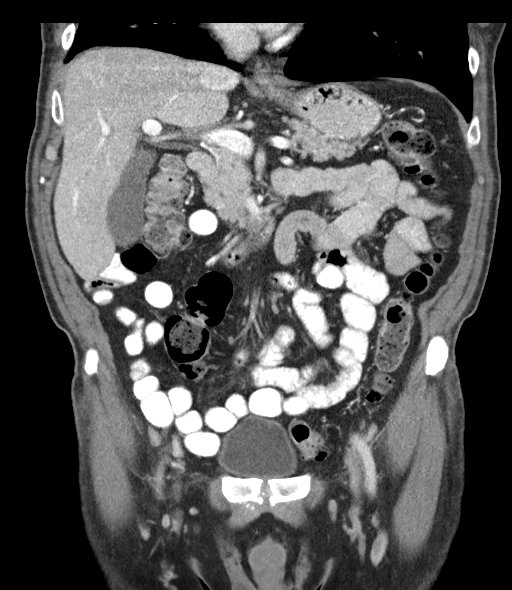
[im 53/95  soft-tissue]
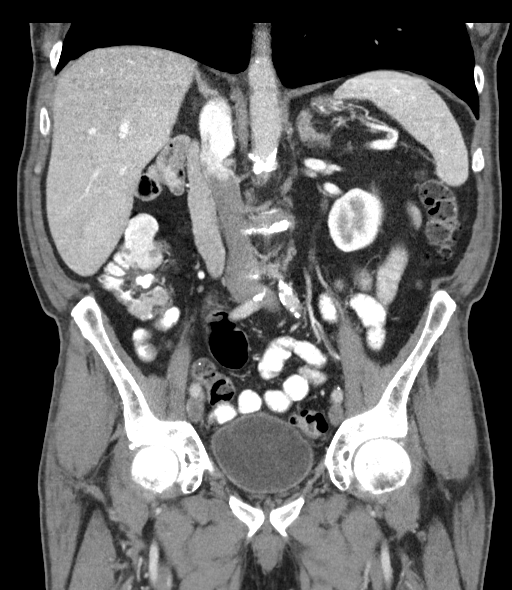

[16 of 46 positions shown; findings below may reference images not displayed]

FINDINGS: Lower chest: Lung bases are unremarkable. Mild elevation of the
right hemidiaphragm.

Hepatobiliary: Enhanced liver is unremarkable. No calcified
gallstones are noted within gallbladder.

Pancreas: Enhanced pancreas is unremarkable.

Spleen: Enhanced spleen is unremarkable.

Adrenals/Urinary Tract: No adrenal gland mass. Kidneys show
symmetrical enhancement. No hydronephrosis or hydroureter. There is
a cyst posterior aspect of the right kidney and measures 1.9 cm
stable. Delayed renal images shows bilateral renal symmetrical
excretion. Bilateral visualized ureter is unremarkable. A cyst in
lower pole of the left kidney measures 6.5 mm.

There is a moderate distended urinary bladder. Enlarged prostate
gland with indentation of urinary bladder base. Prostate gland
measures 4.1 by 4.6 cm.

Stomach/Bowel: No small bowel obstruction. No thickened or dilated
small bowel loops. No pericecal inflammation. Moderate stool noted
within distal colon. Colonic diverticula are noted descending colon
and sigmoid colon. There is redundant sigmoid colon. No evidence of
acute diverticulitis. No distal colonic obstruction.

Vascular/Lymphatic: Atherosclerotic calcifications of abdominal
aorta and iliac arteries. No aortic aneurysm.

Reproductive: No pelvic masses noted.

Other: No ascites or free abdominal air. Small nonspecific bilateral
inguinal lymph nodes.

Musculoskeletal: No destructive bony lesions are noted. Sagittal
images of the spine shows degenerative changes lumbar spine.
Multilevel significant disc space flattening with anterior and
posterior spurring. Vacuum disc phenomenon noted multilevel.
IMPRESSION: 1. No acute inflammatory process within abdomen.
2. No pericecal inflammation.  Appendix is not identified.
3. Moderate stool noted within distal colon. Redundant sigmoid
colon. Distal colonic diverticula. No evidence of acute colitis or
diverticulitis. No colonic obstruction.
4. Mild enlarged prostate gland with indentation of the bladder
base. Moderate distended urinary bladder.
5. No small bowel obstruction.

## 2016-12-03 ENCOUNTER — Other Ambulatory Visit: Payer: Self-pay | Admitting: *Deleted

## 2016-12-03 DIAGNOSIS — I712 Thoracic aortic aneurysm, without rupture, unspecified: Secondary | ICD-10-CM

## 2017-01-11 ENCOUNTER — Ambulatory Visit (INDEPENDENT_AMBULATORY_CARE_PROVIDER_SITE_OTHER): Payer: Medicare Other | Admitting: Thoracic Surgery (Cardiothoracic Vascular Surgery)

## 2017-01-11 ENCOUNTER — Other Ambulatory Visit: Payer: Self-pay

## 2017-01-11 ENCOUNTER — Encounter: Payer: Self-pay | Admitting: Thoracic Surgery (Cardiothoracic Vascular Surgery)

## 2017-01-11 ENCOUNTER — Ambulatory Visit
Admission: RE | Admit: 2017-01-11 | Discharge: 2017-01-11 | Disposition: A | Discharge status: Home / Self Care | Payer: Medicare Other | Source: Ambulatory Visit | Attending: Thoracic Surgery (Cardiothoracic Vascular Surgery) | Admitting: Thoracic Surgery (Cardiothoracic Vascular Surgery)

## 2017-01-11 VITALS — BP 153/75 | HR 74 | Ht 67.0 in | Wt 145.0 lb

## 2017-01-11 DIAGNOSIS — I10 Essential (primary) hypertension: Secondary | ICD-10-CM

## 2017-01-11 DIAGNOSIS — I712 Thoracic aortic aneurysm, without rupture, unspecified: Secondary | ICD-10-CM

## 2017-01-11 DIAGNOSIS — I6523 Occlusion and stenosis of bilateral carotid arteries: Secondary | ICD-10-CM

## 2017-01-11 DIAGNOSIS — I7121 Aneurysm of the ascending aorta, without rupture: Secondary | ICD-10-CM

## 2017-01-11 MED ORDER — IOPAMIDOL (ISOVUE-370) INJECTION 76%
75.0000 mL | Freq: Once | INTRAVENOUS | Status: AC | PRN
  Administered 2017-01-11: 75 mL via INTRAVENOUS

## 2017-01-11 NOTE — Progress Notes (Signed)
[address]       [place] [address]             [phone]       HPI: Mr. Sobieski returns for follow-up regarding his ascending aneurysm  Mr. Samaan is an 83 year old man with a past history significant for coronary artery disease, ascending thoracic aneurysm, type 2 diabetes, hypertension, hyperlipidemia, squamous cell carcinoma of the lung status post lobectomy and gastroesophageal reflux.  He was found to have a 4.8 cm ascending aneurysm on the CT scan in 2010.  Looking back on previous CTs, his aneurysm had been there since 2004 and was unchanged.  I have been following him annually since then.  We have discussed going to every 6 months to the size of the aneurysm, but he does not want to do so.  In the interim since his last visit he has been feeling well.  He has not had any dizziness lately.  He does still have some occasional palpitations.  He checks his blood pressure at least daily and sometimes twice a day.  He says it is consistently less than 242 systolic.  He is no longer on Cozaar.  Past Medical History:  Diagnosis Date  . Arthritis   . BPH (benign prostatic hyperplasia)   . CAD (coronary artery disease)    s/p Xience stent ot the distal RAC 2010, PCI with Xience stent to distal RCA at the [facility] 2011  . COPD (chronic obstructive pulmonary disease) ([place]) 05/22/2008   FEV1/FVS 44.9  . Diabetes mellitus    Well controlled  . GERD (gastroesophageal reflux disease)   . Hyperlipidemia   . Hypertension   . Spontaneous pneumothorax   . 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 Thoracic ascending aortic aneurysm (HCC)    4.8 cm   . Type II or unspecified type diabetes mellitus without mention of complication, not stated as uncontrolled     Current Outpatient Medications  Medication Sig Dispense Refill  . aspirin EC 81 MG tablet Take 81 mg by mouth daily.    . magnesium  oxide (MAG-OX) 400 MG tablet Take 400 mg by mouth daily.    . Omega-3 Fatty Acids (FISH OIL) 1000 MG CAPS Take 3 capsules by mouth daily.    . Tamsulosin HCl (FLOMAX) 0.4 MG CAPS Take 0.4 mg by mouth daily after supper.      No current facility-administered medications for this visit.     Physical Exam BP (!) 153/75 (BP Location: Left Arm, Patient Position: Sitting, Cuff Size: Normal)   Pulse 74   Ht 5\' 7"  (1.702 m)   Wt 145 lb (65.8 kg)   SpO2 97%   BMI 22.66 kg/m  83 year old man in no acute distress Alert and oriented x3 with no focal deficits No carotid bruits Cardiac regular rate and rhythm normal S1 and S2 with no murmur Faint wheezing bilaterally Peripheral pulses intact  Diagnostic Tests: CT ANGIOGRAPHY CHEST WITH CONTRAST  TECHNIQUE: Multidetector CT imaging of the chest was performed using the standard protocol during bolus administration of intravenous contrast. Multiplanar CT image reconstructions and MIPs were obtained to evaluate the vascular anatomy.  CONTRAST:  25mL ISOVUE-370 IOPAMIDOL (ISOVUE-370) INJECTION 76%  COMPARISON:  11/04/2015 as well as other prior CTA exams.  FINDINGS: Cardiovascular: Stable aneurysmal disease of the ascending thoracic aorta measuring 4.6 cm in greatest diameter. Stable calcification  at the level of the aortic valve and annulus. The proximal arch measures 3 cm and the distal arch 2.6 cm. The descending thoracic aorta measures 2.3 cm. Stable atherosclerotic plaque at the level of the aortic arch and descending thoracic aorta. Proximal great vessels show stable patency without significant stenosis. No evidence of aortic dissection, penetrating ulcer disease or intramural hemorrhage.  The heart size is stable and within normal limits. Stable calcified plaque in the distribution of the LAD. The right coronary artery is blurred by cardiac motion and contains a stent distally. No pericardial fluid. Central pulmonary  arteries are normal in caliber.  Mediastinum/Nodes: No enlarged mediastinal, hilar, or axillary lymph nodes. Thyroid gland, trachea, and esophagus demonstrate no significant findings.  Lungs/Pleura: Stable emphysematous lung disease and scattered bilateral parenchymal scarring. There is no evidence of pulmonary edema, consolidation, pneumothorax, nodule or pleural fluid.  Upper Abdomen: No acute abnormality.  Musculoskeletal: No chest wall abnormality. No acute or significant osseous findings.  Review of the MIP images confirms the above findings.  IMPRESSION: Stable aneurysmal disease of the ascending thoracic aorta measuring 4.6 cm in greatest diameter.  Aortic aneurysm NOS (ICD10-I71.9).   Electronically Signed   By: Aletta Edouard M.D.   On: 01/11/2017 13:19 I personally reviewed the CT images and concur with the findings noted above  Impression: Mr. Prieto is an 83 year old man with a stable ascending aneurysm for about 14 years now.  It is unchanged over the past year.  Hypertension-no longer on Cozaar.  He has a blood pressure cuff at home and checks himself on a regular basis.  I am concerned about his blood pressure today, but he is adamant that it is usually much lower than that and does not want to start a blood pressure medication.  He says that he will continue to check himself on a daily basis.   Plan: Return in 1 year with CT angio of chest  Melrose Nakayama, MD [facility] [phone]

## 2017-04-26 ENCOUNTER — Encounter: Payer: Self-pay | Admitting: Internal Medicine

## 2017-06-05 ENCOUNTER — Other Ambulatory Visit: Payer: Self-pay

## 2017-06-05 ENCOUNTER — Encounter (HOSPITAL_COMMUNITY): Payer: Self-pay | Admitting: Emergency Medicine

## 2017-06-05 ENCOUNTER — Emergency Department (HOSPITAL_COMMUNITY)
Admission: EM | Admit: 2017-06-05 | Discharge: 2017-06-05 | Disposition: A | Discharge status: Home / Self Care | Payer: Medicare Other | Attending: Emergency Medicine | Admitting: Emergency Medicine

## 2017-06-05 ENCOUNTER — Emergency Department (HOSPITAL_COMMUNITY): Payer: Medicare Other

## 2017-06-05 DIAGNOSIS — Z955 Presence of coronary angioplasty implant and graft: Secondary | ICD-10-CM | POA: Diagnosis not present

## 2017-06-05 DIAGNOSIS — Z85118 Personal history of other malignant neoplasm of bronchus and lung: Secondary | ICD-10-CM | POA: Insufficient documentation

## 2017-06-05 DIAGNOSIS — J449 Chronic obstructive pulmonary disease, unspecified: Secondary | ICD-10-CM | POA: Insufficient documentation

## 2017-06-05 DIAGNOSIS — Z87891 Personal history of nicotine dependence: Secondary | ICD-10-CM | POA: Diagnosis not present

## 2017-06-05 DIAGNOSIS — I10 Essential (primary) hypertension: Secondary | ICD-10-CM | POA: Insufficient documentation

## 2017-06-05 DIAGNOSIS — Z79899 Other long term (current) drug therapy: Secondary | ICD-10-CM | POA: Diagnosis not present

## 2017-06-05 DIAGNOSIS — Z7982 Long term (current) use of aspirin: Secondary | ICD-10-CM | POA: Insufficient documentation

## 2017-06-05 DIAGNOSIS — W010XXA Fall on same level from slipping, tripping and stumbling without subsequent striking against object, initial encounter: Secondary | ICD-10-CM | POA: Diagnosis not present

## 2017-06-05 DIAGNOSIS — Y929 Unspecified place or not applicable: Secondary | ICD-10-CM | POA: Diagnosis not present

## 2017-06-05 DIAGNOSIS — E119 Type 2 diabetes mellitus without complications: Secondary | ICD-10-CM | POA: Diagnosis not present

## 2017-06-05 DIAGNOSIS — I2510 Atherosclerotic heart disease of native coronary artery without angina pectoris: Secondary | ICD-10-CM | POA: Insufficient documentation

## 2017-06-05 DIAGNOSIS — Y999 Unspecified external cause status: Secondary | ICD-10-CM | POA: Insufficient documentation

## 2017-06-05 DIAGNOSIS — S63501A Unspecified sprain of right wrist, initial encounter: Secondary | ICD-10-CM | POA: Diagnosis not present

## 2017-06-05 DIAGNOSIS — Y9389 Activity, other specified: Secondary | ICD-10-CM | POA: Insufficient documentation

## 2017-06-05 DIAGNOSIS — S6991XA Unspecified injury of right wrist, hand and finger(s), initial encounter: Secondary | ICD-10-CM | POA: Diagnosis present

## 2017-06-05 NOTE — ED Provider Notes (Signed)
[facility] EMERGENCY DEPARTMENT Provider Note   CSN: [id number] Arrival date & time: 06/05/17  0846     History   Chief Complaint Chief Complaint  Patient presents with  . Fall    HPI Brian Austin is a 84 y.o. male.  84 year old male right-hand-dominant complaining of right wrist pain after a fall on Friday.  States he lost his balance putting on a boot slipped over a bench broke his fall with his right upper extremity.  Since then he has had pain in his right wrist increased with range of motion.  Rates the pain is 8 out of 10.  Sharp pain nature.  Improved with rest and wrapping it.  He is taken no medications for the pain.  The history is provided by the patient.  Fall  This is a new problem. The current episode started 2 days ago. The problem occurs constantly. The problem has not changed since onset.Pertinent negatives include no chest pain, no abdominal pain, no headaches and no shortness of breath. The symptoms are aggravated by bending and twisting. The symptoms are relieved by position and rest. He has tried a warm compress for the symptoms. The treatment provided mild relief.    Past Medical History:  Diagnosis Date  . Arthritis   . BPH (benign prostatic hyperplasia)   . CAD (coronary artery disease)    s/p Xience stent ot the distal RAC 2010, PCI with Xience stent to distal RCA at the [facility] 2011  . COPD (chronic obstructive pulmonary disease) ([place]) 05/22/2008   FEV1/FVS 44.9  . Diabetes mellitus    Well controlled  . GERD (gastroesophageal reflux disease)   . Hyperlipidemia   . Hypertension   . Spontaneous pneumothorax   . Squamous cell carcinoma lung ([place]) 2000   CT w/ CM; 04/25/2009 COPD, s/p RLL, CAD, 5 cm ascending aortic aneurysm  . Thoracic ascending aortic aneurysm (HCC)    4.8 cm   . Type II or unspecified type diabetes mellitus without mention of complication, not stated as uncontrolled     Patient Active Problem List   Diagnosis Date Noted  .  Bilateral carotid artery stenosis 10/31/2016  . Nontraumatic incomplete tear of right rotator cuff 01/22/2013  . Pain in joint, shoulder region 01/22/2013  . Dog bite of hand 11/10/2012  . Cellulitis and abscess of hand 11/10/2012  . Hyperlipidemia   . Type II or unspecified type diabetes mellitus without mention of complication, not stated as uncontrolled   . Jaw pain   . CAD (coronary artery disease)   . ASCENDING AORTIC ANEURYSM 04/28/2009  . HEMOPTYSIS UNSPECIFIED 04/25/2009  . CHEST PAIN, ATYPICAL 04/25/2009  . LUNG CANCER, HX OF 04/25/2009  . Chronic ischemic heart disease 07/02/2008  . MITRAL REGURGITATION 06/28/2008  . Essential hypertension 06/28/2008  . COPD 06/28/2008  . CARDIOVASCULAR FUNCTION STUDY, ABNORMAL 06/19/2008  . AODM 06/13/2008  . Hyperlipemia 06/13/2008  . JAW PAIN 06/13/2008  . PRECORDIAL PAIN 06/13/2008    Past Surgical History:  Procedure Laterality Date  . BACK SURGERY    . CARDIAC CATHETERIZATION  2010   Stent  . I&D EXTREMITY Left 11/10/2012   Procedure: IRRIGATION AND DEBRIDEMENT Left third-long finger ;  Surgeon: Jolyn Nap, MD;  Location: [facility];  Service: Orthopedics;  Laterality: Left;  . LOBECTOMY  2000   Right lower for squamous cell ca  . LUMBAR LAMINECTOMY  1978  . Lymph node resected  1983   Axilla  . Needle removed from the  right foot    . Spontaneous pneumothrax  1979        Home Medications    Prior to Admission medications   Medication Sig Start Date End Date Taking? Authorizing Provider  aspirin EC 81 MG tablet Take 81 mg by mouth daily.    [provider]  magnesium oxide (MAG-OX) 400 MG tablet Take 400 mg by mouth daily.    [provider]  Omega-3 Fatty Acids (FISH OIL) 1000 MG CAPS Take 3 capsules by mouth daily.    [provider]  Tamsulosin HCl (FLOMAX) 0.4 MG CAPS Take 0.4 mg by mouth daily after supper.     [provider]    Family History Family History  Problem  Relation Age of Onset  . Diabetes Father   . Alcohol abuse Father   . Heart disease Neg Hx     Social History Social History   Tobacco Use  . Smoking status: Former Smoker    Last attempt to quit: 02/16/1984    Years since quitting: 33.3  . Smokeless tobacco: Never Used  . Tobacco comment: Smoked for 30 years  Substance Use Topics  . Alcohol use: Yes    Comment: wine with supper  . Drug use: No     Allergies   Patient has no known allergies.   Review of Systems Review of Systems  Constitutional: Negative for fever.  HENT: Negative for sore throat.   Eyes: Negative for visual disturbance.  Respiratory: Negative for shortness of breath.   Cardiovascular: Negative for chest pain.  Gastrointestinal: Negative for abdominal pain.  Genitourinary: Negative for dysuria.  Musculoskeletal: Positive for joint swelling. Negative for back pain and neck pain.  Skin: Negative for rash.  Neurological: Negative for headaches.     Physical Exam Updated Vital Signs BP (!) 143/85 (BP Location: Left Arm)   Pulse 77   Temp 97.6 F (36.4 C) (Oral)   Resp 16   Ht 5\' 6"  (1.676 m)   Wt 65.8 kg (145 lb)   SpO2 100%   BMI 23.40 kg/m   Physical Exam  Constitutional: He appears well-developed and well-nourished.  HENT:  Head: Normocephalic and atraumatic.  Eyes: Conjunctivae are normal.  Neck: Neck supple.  Cardiovascular: Normal rate and regular rhythm.  Pulmonary/Chest: Effort normal and breath sounds normal.  Abdominal: Soft. There is no tenderness. There is no guarding.  Musculoskeletal:  Right upper extremity full range of motion shoulder and elbow.  He is got painful limited range of motion right wrist.  Moves digits easily without any tenderness.  There is some swelling of his right wrist and tenderness across the dorsum of the wrist snuffbox and medial side of her wrist.  Radial +2, cap refill brisk.  Sensory intact to radian ulnar median.  Other extremities full range of  motion without any tenderness.  Neurological: He is alert. GCS eye subscore is 4. GCS verbal subscore is 5. GCS motor subscore is 6.  Skin: Skin is warm and dry.  Psychiatric: He has a normal mood and affect.  Nursing note and vitals reviewed.    ED Treatments / Results  Labs (all labs ordered are listed, but only abnormal results are displayed) Labs Reviewed - No data to display  EKG None  Radiology No results found.  DG Wrist Complete Right  Final Result    DG Forearm Right  Final Result       Procedures Procedures (including critical care time)  Medications Ordered in ED  Medications - No data to display   Initial Impression / Assessment and Plan / ED Course  I have reviewed the triage vital signs and the nursing notes.  Pertinent labs & imaging results that were available during my care of the patient were reviewed by me and considered in my medical decision making (see chart for details).  Clinical Course as of Jun 07 1024  Sun Jun 05, 2017  0903 Differential includes fracture, dislocation, sprain.    [MB]  1046 X-ray with no obvious fracture.  We will place him in a splint and have him follow-up with his primary care doctor.   [MB]    Clinical Course User Index [MB] Hayden Rasmussen, MD     Final Clinical Impressions(s) / ED Diagnoses   Final diagnoses:  Right wrist sprain, initial encounter    ED Discharge Orders    None       Hayden Rasmussen, MD 06/07/17 1027

## 2017-06-05 NOTE — Discharge Instructions (Addendum)
You are evaluated in the emergency department for right wrist pain after a fall.  Your x-ray did not show an obvious sign of fracture.  We are giving you a wrist splint to help limit the motion at that area and you should use ice and Tylenol and ibuprofen for pain.  Please follow-up with your doctor this week for reevaluation and possible orthopedic referral.

## 2017-06-05 NOTE — ED Triage Notes (Signed)
Pt states he was trying to put his boot on and he lost his balance and fell forward.  Caught self with right wrist.

## 2017-06-23 ENCOUNTER — Other Ambulatory Visit: Payer: Self-pay

## 2017-06-23 ENCOUNTER — Ambulatory Visit (INDEPENDENT_AMBULATORY_CARE_PROVIDER_SITE_OTHER): Payer: Medicare Other | Admitting: Nurse Practitioner

## 2017-06-23 ENCOUNTER — Encounter: Payer: Self-pay | Admitting: Nurse Practitioner

## 2017-06-23 DIAGNOSIS — R1310 Dysphagia, unspecified: Secondary | ICD-10-CM | POA: Diagnosis not present

## 2017-06-23 DIAGNOSIS — R1319 Other dysphagia: Secondary | ICD-10-CM

## 2017-06-23 DIAGNOSIS — R634 Abnormal weight loss: Secondary | ICD-10-CM

## 2017-06-23 NOTE — Patient Instructions (Signed)
1. We will schedule your procedure for you. 2. Further recommendations will be made after your procedure. 3. We will request your [facility] [place], [place]. 4. Return for follow-up in 3 months. 5. Call us if you have any questions or concerns.  At [facility] we value your feedback. You may receive a survey about your visit today. Please share your experience as we strive to create trusting relationships with our patients to provide genuine, compassionate, quality care.  It was great to meet you today!  I hope you and BJ have a great summer!!

## 2017-06-23 NOTE — Assessment & Plan Note (Signed)
The patient describes intermittent a done aphasia.  This typically happens with liquids.  Sometimes it is a problem, sometimes it is not.  Symptoms are quite variable.  He does have dysphagia as well, as per below.  Given these we will proceed with upper endoscopy with possible dilation as per below.  Follow-up in 3 months.

## 2017-06-23 NOTE — Assessment & Plan Note (Signed)
The patient describes esophageal dysphasia symptoms in addition to odynophagia.  Unable to name specific foods.  This is been ongoing for couple years, but seems to be getting worse.  He at times feels like he is at risk for aspiration.  Occasional regurgitation.  Denies hematemesis.  No melena.  He does have some recent unintentional weight loss despite good appetite, subjectively has lost about 15 pounds in the last 6 months.  Colonoscopy is up-to-date and we will request records related to this.  No other red flag/warning signs or symptoms.  We will proceed with EGD with possible dilation for further evaluation.  Proceed with EGD +/- dilation with Dr. Gala Romney in near future: the risks, benefits, and alternatives have been discussed with the patient in detail. The patient states understanding and desires to proceed.  The patient is not on any anticoagulants, anxiolytics, chronic pain medications, or antidepressants.  Conscious sedation should be adequate for his procedure

## 2017-06-23 NOTE — Assessment & Plan Note (Signed)
The patient notes a subjective weight loss unintentionally of about 15 pounds in the past 6 months.  He eats a good breakfast, good dinner, often will skip lunch due to being outside in the yard.  Overall, he feels he has adequate oral intake.  He does have dysphagia and odynophagia as per below.  We will track his weight and compared to follow-up in 3 months.  We will place proceed with upper endoscopy with possible dilation given his symptoms and weight loss.  We will request previous colonoscopy which was completed about 2 years ago [facility] in [place], [place].  He states this was unremarkable.

## 2017-06-23 NOTE — Progress Notes (Signed)
cc'd to pcp 

## 2017-06-23 NOTE — Progress Notes (Signed)
Primary Care Physician:  Barry Dienes, NP Primary Gastroenterologist:  Dr. Gala Romney  Chief Complaint  Patient presents with  . Dysphagia    HPI:   Brian Austin is a 84 y.o. male who presents on referral from primary care for scheduling of colonoscopy as well as dysphasia.  Reviewed information provided with referral including office visit note dated 04/21/2017.  At that point the patient noted pain from his neck to his epigastric area every time he swallows food for "a while now" but noticed recent worsening.  Also endorsed some dysphasia.  The patient was referred to [place] for further evaluation of odynophagia and dysphasia.  Today he states he's doing ok overall. Had a colonoscopy about 2 years ago in [place] and was told he wouldn't need another one due to his age. He has intermittent constipation which he controls with OTC medications. Having odynophagia for the past several years, some recent worsening. Intermittent. Typically with certain liquids. Having frequent solid food dysphagia which occurs a couple times a week; worse/more frequent symptoms with foods that he is unable to name. Occasionally feels like he is at risk for aspiration when he has dysphagia. Occasional regurgitation. Denies abdominal pain, N/V, hematochezia, melena, fever, chills. Has been having unintentional weight loss of about 15 lbs in the last 6 months. Appetite is good, eats a big breakfast, occasionally skips lunch (working outside) and then has a good dinner. Denies chest pain, dyspnea, dizziness, lightheadedness, syncope, near syncope. Denies any other upper or lower GI symptoms.   Past Medical History:  Diagnosis Date  . Arthritis   . BPH (benign prostatic hyperplasia)   . CAD (coronary artery disease)    s/p Xience stent ot the distal RAC 2010, PCI with Xience stent to distal RCA at the [facility] 2011  . COPD (chronic obstructive pulmonary disease) ([place]) 05/22/2008   FEV1/FVS 44.9  . Diabetes mellitus    Well  controlled  . GERD (gastroesophageal reflux disease)   . Hyperlipidemia   . Hypertension   . Spontaneous pneumothorax   . 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 Thoracic ascending aortic aneurysm (HCC)    4.8 cm   . Type II or unspecified type diabetes mellitus without mention of complication, not stated as uncontrolled     Past Surgical History:  Procedure Laterality Date  . BACK SURGERY    . CARDIAC CATHETERIZATION  2010   Stent  . I&D EXTREMITY Left 11/10/2012   Procedure: IRRIGATION AND DEBRIDEMENT Left third-long finger ;  Surgeon: Jolyn Nap, MD;  Location: [facility];  Service: Orthopedics;  Laterality: Left;  . LOBECTOMY  2000   Right lower for squamous cell ca  . LUMBAR LAMINECTOMY  1978  . Lymph node resected  1983   Axilla  . Needle removed from the right foot    . Spontaneous pneumothrax  1979    Current Outpatient Medications  Medication Sig Dispense Refill  . aspirin EC 81 MG tablet Take 81 mg by mouth daily.    Marland Kitchen losartan (COZAAR) 25 MG tablet Take 1 tablet by mouth as needed.     . magnesium oxide (MAG-OX) 400 MG tablet Take 400 mg by mouth daily.    . Omega-3 Fatty Acids (FISH OIL) 1000 MG CAPS Take 1 capsule by mouth daily.     . Tamsulosin HCl (FLOMAX) 0.4 MG CAPS Take 0.4 mg by mouth daily  after supper.      No current facility-administered medications for this visit.     Allergies as of 06/23/2017  . (No Known Allergies)    Family History  Problem Relation Age of Onset  . Diabetes Father   . Alcohol abuse Father   . Heart disease Neg Hx   . Colon cancer Neg Hx   . Gastric cancer Neg Hx   . Esophageal cancer Neg Hx     Social History   Socioeconomic History  . Marital status: Divorced    Spouse name: Not on file  . Number of children: Not on file  . Years of education: Not on file  . Highest education level: Not on file  Occupational History  . Occupation:  Retired  Scientific laboratory technician  . Financial resource strain: Not on file  . Food insecurity:    Worry: Not on file    Inability: Not on file  . Transportation needs:    Medical: Not on file    Non-medical: Not on file  Tobacco Use  . Smoking status: Former Smoker    Last attempt to quit: 02/16/1984    Years since quitting: 33.3  . Smokeless tobacco: Never Used  . Tobacco comment: Smoked for 30 years  Substance and Sexual Activity  . Alcohol use: Yes    Comment: wine with supper  . Drug use: No  . Sexual activity: Never  Lifestyle  . Physical activity:    Days per week: Not on file    Minutes per session: Not on file  . Stress: Not on file  Relationships  . Social connections:    Talks on phone: Not on file    Gets together: Not on file    Attends religious service: Not on file    Active member of club or organization: Not on file    Attends meetings of clubs or organizations: Not on file    Relationship status: Not on file  . Intimate partner violence:    Fear of current or ex partner: Not on file    Emotionally abused: Not on file    Physically abused: Not on file    Forced sexual activity: Not on file  Other Topics Concern  . Not on file  Social History Narrative   Retired [employer]   Divorced four times   3 children   Single    Review of Systems: General: Negative for anorexia, weight loss, fever, chills, fatigue, weakness. Eyes: Negative for vision changes.  ENT: Negative for hoarseness. Admits dysphagia and odynophagia. CV: Negative for chest pain, angina, palpitations, peripheral edema.  Respiratory: Negative for dyspnea at rest, cough, sputum, wheezing.  GI: See history of present illness. MS: Negative for joint pain, low back pain.  Endo: Negative for unusual weight change.  Heme: Negative for bruising or bleeding. Allergy: Negative for rash or hives.    Physical Exam: BP 116/61   Pulse 79   Temp (!) 97.1 F (36.2 C) (Oral)   Ht 5' 7.5" (1.715 m)   Wt  145 lb 3.2 oz (65.9 kg)   BMI 22.41 kg/m  General:   Alert and oriented. Pleasant and cooperative. Well-nourished and well-developed.  Head:  Normocephalic and atraumatic. Eyes:  Without icterus, sclera clear and conjunctiva pink.  Ears:  Normal auditory acuity. Cardiovascular:  S1, S2 present without murmurs appreciated. Extremities without clubbing or edema. Respiratory:  Clear to auscultation bilaterally. No wheezes, rales, or rhonchi. No distress.  Gastrointestinal:  +BS, soft, non-tender  and non-distended. No HSM noted. No guarding or rebound. No masses appreciated.  Rectal:  Deferred  Musculoskalatal:  Symmetrical without gross deformities. Skin:  Intact without significant lesions or rashes. Neurologic:  Alert and oriented x4;  grossly normal neurologically. Psych:  Alert and cooperative. Normal mood and affect. Heme/Lymph/Immune: No excessive bruising noted.    06/23/2017 11:17 AM   Disclaimer: This note was dictated with voice recognition software. Similar sounding words can inadvertently be transcribed and may not be corrected upon review.

## 2017-08-09 ENCOUNTER — Telehealth: Payer: Self-pay

## 2017-08-09 NOTE — Telephone Encounter (Signed)
Tried to call pt to see if he can arrive earlier tomorrow for EGD/-/+Dil d/t a cancellation. Unable to reach pt, home phone number rings then sounds like a fax machine. Mobile went straight to VM and no VM box set-up.

## 2017-08-10 ENCOUNTER — Encounter (HOSPITAL_COMMUNITY): Admission: RE | Payer: Self-pay | Source: Ambulatory Visit

## 2017-08-10 ENCOUNTER — Telehealth: Payer: Self-pay | Admitting: Internal Medicine

## 2017-08-10 ENCOUNTER — Ambulatory Visit (HOSPITAL_COMMUNITY): Admission: RE | Admit: 2017-08-10 | Payer: Medicare Other | Source: Ambulatory Visit | Admitting: Internal Medicine

## 2017-08-10 SURGERY — EGD (ESOPHAGOGASTRODUODENOSCOPY)
Anesthesia: Moderate Sedation

## 2017-08-10 NOTE — Telephone Encounter (Signed)
Called pt to reschedule procedure. He will call office back.

## 2017-08-10 NOTE — Telephone Encounter (Signed)
Patient needs to cancel procedure today, his ride is sick

## 2017-08-10 NOTE — Telephone Encounter (Signed)
LMOVM and informed Endo scheduler.

## 2017-08-12 NOTE — Telephone Encounter (Signed)
Tried to call pt, home number rings then sounds like a fax machine. Tried to call mobile number, no VM set-up.

## 2017-08-16 NOTE — Telephone Encounter (Signed)
Letter mailed to pt.  

## 2017-08-30 ENCOUNTER — Encounter: Payer: Self-pay | Admitting: *Deleted

## 2017-08-30 ENCOUNTER — Other Ambulatory Visit: Payer: Self-pay | Admitting: *Deleted

## 2017-08-30 DIAGNOSIS — R1319 Other dysphagia: Secondary | ICD-10-CM

## 2017-08-30 DIAGNOSIS — R1310 Dysphagia, unspecified: Secondary | ICD-10-CM

## 2017-08-30 NOTE — Telephone Encounter (Signed)
Patient called back. He is r/s'd to 09/30/17 at 2:15pm for EGD +/-DIL with RMR. I have mailed him new instructions in the mail.

## 2017-09-06 ENCOUNTER — Encounter: Payer: Self-pay | Admitting: Internal Medicine

## 2017-09-26 ENCOUNTER — Encounter: Payer: Self-pay | Admitting: Nurse Practitioner

## 2017-09-26 ENCOUNTER — Ambulatory Visit (INDEPENDENT_AMBULATORY_CARE_PROVIDER_SITE_OTHER): Payer: Medicare Other | Admitting: Nurse Practitioner

## 2017-09-26 VITALS — BP 122/64 | HR 76 | Temp 96.7°F | Ht 67.5 in | Wt 148.0 lb

## 2017-09-26 DIAGNOSIS — R1310 Dysphagia, unspecified: Secondary | ICD-10-CM

## 2017-09-28 ENCOUNTER — Ambulatory Visit: Payer: Medicare Other | Admitting: Nurse Practitioner

## 2017-09-29 NOTE — Progress Notes (Signed)
Patient cancelled. No charge.

## 2017-09-30 ENCOUNTER — Encounter (HOSPITAL_COMMUNITY): Payer: Self-pay | Admitting: *Deleted

## 2017-09-30 ENCOUNTER — Encounter (HOSPITAL_COMMUNITY)
Admission: RE | Disposition: A | Discharge status: Home / Self Care | Payer: Self-pay | Source: Ambulatory Visit | Attending: Internal Medicine

## 2017-09-30 ENCOUNTER — Ambulatory Visit (HOSPITAL_COMMUNITY)
Admission: RE | Admit: 2017-09-30 | Discharge: 2017-09-30 | Disposition: A | Discharge status: Home / Self Care | Payer: Medicare Other | Source: Ambulatory Visit | Attending: Internal Medicine | Admitting: Internal Medicine

## 2017-09-30 ENCOUNTER — Other Ambulatory Visit: Payer: Self-pay

## 2017-09-30 DIAGNOSIS — Z79899 Other long term (current) drug therapy: Secondary | ICD-10-CM | POA: Insufficient documentation

## 2017-09-30 DIAGNOSIS — Z85118 Personal history of other malignant neoplasm of bronchus and lung: Secondary | ICD-10-CM | POA: Insufficient documentation

## 2017-09-30 DIAGNOSIS — I10 Essential (primary) hypertension: Secondary | ICD-10-CM | POA: Diagnosis not present

## 2017-09-30 DIAGNOSIS — R1310 Dysphagia, unspecified: Secondary | ICD-10-CM | POA: Diagnosis present

## 2017-09-30 DIAGNOSIS — K209 Esophagitis, unspecified: Secondary | ICD-10-CM | POA: Diagnosis not present

## 2017-09-30 DIAGNOSIS — Z87891 Personal history of nicotine dependence: Secondary | ICD-10-CM | POA: Diagnosis not present

## 2017-09-30 DIAGNOSIS — E119 Type 2 diabetes mellitus without complications: Secondary | ICD-10-CM | POA: Insufficient documentation

## 2017-09-30 DIAGNOSIS — I2510 Atherosclerotic heart disease of native coronary artery without angina pectoris: Secondary | ICD-10-CM | POA: Diagnosis not present

## 2017-09-30 DIAGNOSIS — K449 Diaphragmatic hernia without obstruction or gangrene: Secondary | ICD-10-CM | POA: Diagnosis not present

## 2017-09-30 DIAGNOSIS — K210 Gastro-esophageal reflux disease with esophagitis: Secondary | ICD-10-CM | POA: Insufficient documentation

## 2017-09-30 DIAGNOSIS — R1319 Other dysphagia: Secondary | ICD-10-CM

## 2017-09-30 DIAGNOSIS — K222 Esophageal obstruction: Secondary | ICD-10-CM | POA: Insufficient documentation

## 2017-09-30 DIAGNOSIS — E785 Hyperlipidemia, unspecified: Secondary | ICD-10-CM | POA: Insufficient documentation

## 2017-09-30 DIAGNOSIS — Z955 Presence of coronary angioplasty implant and graft: Secondary | ICD-10-CM | POA: Insufficient documentation

## 2017-09-30 DIAGNOSIS — K3189 Other diseases of stomach and duodenum: Secondary | ICD-10-CM | POA: Diagnosis not present

## 2017-09-30 DIAGNOSIS — N400 Enlarged prostate without lower urinary tract symptoms: Secondary | ICD-10-CM | POA: Insufficient documentation

## 2017-09-30 DIAGNOSIS — J449 Chronic obstructive pulmonary disease, unspecified: Secondary | ICD-10-CM | POA: Diagnosis not present

## 2017-09-30 DIAGNOSIS — I712 Thoracic aortic aneurysm, without rupture: Secondary | ICD-10-CM | POA: Insufficient documentation

## 2017-09-30 DIAGNOSIS — Z7982 Long term (current) use of aspirin: Secondary | ICD-10-CM | POA: Insufficient documentation

## 2017-09-30 HISTORY — PX: MALONEY DILATION: SHX5535

## 2017-09-30 HISTORY — PX: ESOPHAGOGASTRODUODENOSCOPY: SHX5428

## 2017-09-30 SURGERY — EGD (ESOPHAGOGASTRODUODENOSCOPY)
Anesthesia: Moderate Sedation

## 2017-09-30 MED ORDER — MEPERIDINE HCL 100 MG/ML IJ SOLN
INTRAMUSCULAR | Status: DC | PRN
  Administered 2017-09-30: 25 mg via INTRAVENOUS

## 2017-09-30 MED ORDER — SODIUM CHLORIDE 0.9 % IV SOLN
INTRAVENOUS | Status: DC
  Administered 2017-09-30: 1000 mL via INTRAVENOUS

## 2017-09-30 MED ORDER — MEPERIDINE HCL 50 MG/ML IJ SOLN
INTRAMUSCULAR | Status: AC
  Filled 2017-09-30: qty 1

## 2017-09-30 MED ORDER — LIDOCAINE VISCOUS HCL 2 % MT SOLN
OROMUCOSAL | Status: DC | PRN
  Administered 2017-09-30: 1 via OROMUCOSAL

## 2017-09-30 MED ORDER — LIDOCAINE VISCOUS HCL 2 % MT SOLN
OROMUCOSAL | Status: AC
  Filled 2017-09-30: qty 15

## 2017-09-30 MED ORDER — ONDANSETRON HCL 4 MG/2ML IJ SOLN
INTRAMUSCULAR | Status: AC
  Filled 2017-09-30: qty 2

## 2017-09-30 MED ORDER — MIDAZOLAM HCL 5 MG/5ML IJ SOLN
INTRAMUSCULAR | Status: DC | PRN
  Administered 2017-09-30 (×2): 1 mg via INTRAVENOUS

## 2017-09-30 MED ORDER — STERILE WATER FOR IRRIGATION IR SOLN
Status: DC | PRN
  Administered 2017-09-30: 1.5 mL

## 2017-09-30 MED ORDER — MIDAZOLAM HCL 5 MG/5ML IJ SOLN
INTRAMUSCULAR | Status: AC
  Filled 2017-09-30: qty 5

## 2017-09-30 NOTE — Discharge Instructions (Signed)
EGD Discharge instructions Please read the instructions outlined below and refer to this sheet in the next few weeks. These discharge instructions provide you with general information on caring for yourself after you leave the hospital. Your doctor may also give you specific instructions. While your treatment has been planned according to the most current medical practices available, unavoidable complications occasionally occur. If you have any problems or questions after discharge, please call your doctor. ACTIVITY  You may resume your regular activity but move at a slower pace for the next 24 hours.   Take frequent rest periods for the next 24 hours.   Walking will help expel (get rid of) the air and reduce the bloated feeling in your abdomen.   No driving for 24 hours (because of the anesthesia (medicine) used during the test).   You may shower.   Do not sign any important legal documents or operate any machinery for 24 hours (because of the anesthesia used during the test).  NUTRITION  Drink plenty of fluids.   You may resume your normal diet.   Begin with a light meal and progress to your normal diet.   Avoid alcoholic beverages for 24 hours or as instructed by your caregiver.  MEDICATIONS  You may resume your normal medications unless your caregiver tells you otherwise.  WHAT YOU CAN EXPECT TODAY  You may experience abdominal discomfort such as a feeling of fullness or gas pains.  FOLLOW-UP  Your doctor will discuss the results of your test with you.  SEEK IMMEDIATE MEDICAL ATTENTION IF ANY OF THE FOLLOWING OCCUR:  Excessive nausea (feeling sick to your stomach) and/or vomiting.   Severe abdominal pain and distention (swelling).   Trouble swallowing.   Temperature over 101 F (37.8 C).   Rectal bleeding or vomiting of blood.    GERD information provided  Begin Protonix 40 mg daily  Office visit with [place] in 3 months     Gastroesophageal Reflux Disease,  Adult Normally, food travels down the esophagus and stays in the stomach to be digested. If a person has gastroesophageal reflux disease (GERD), food and stomach acid move back up into the esophagus. When this happens, the esophagus becomes sore and swollen (inflamed). Over time, GERD can make small holes (ulcers) in the lining of the esophagus. Follow these instructions at home: Diet  Follow a diet as told by your doctor. You may need to avoid foods and drinks such as: ? Coffee and tea (with or without caffeine). ? Drinks that contain alcohol. ? Energy drinks and sports drinks. ? Carbonated drinks or sodas. ? Chocolate and cocoa. ? Peppermint and mint flavorings. ? Garlic and onions. ? Horseradish. ? Spicy and acidic foods, such as peppers, chili powder, curry powder, vinegar, hot sauces, and BBQ sauce. ? Citrus fruit juices and citrus fruits, such as oranges, lemons, and limes. ? Tomato-based foods, such as red sauce, chili, salsa, and pizza with red sauce. ? Fried and fatty foods, such as donuts, french fries, potato chips, and high-fat dressings. ? High-fat meats, such as hot dogs, rib eye steak, sausage, ham, and bacon. ? High-fat dairy items, such as whole milk, butter, and cream cheese.  Eat small meals often. Avoid eating large meals.  Avoid drinking large amounts of liquid with your meals.  Avoid eating meals during the 2-3 hours before bedtime.  Avoid lying down right after you eat.  Do not exercise right after you eat. General instructions  Pay attention to any changes in your  symptoms.  Take over-the-counter and prescription medicines only as told by your doctor. Do not take aspirin, ibuprofen, or other NSAIDs unless your doctor says it is okay.  Do not use any tobacco products, including cigarettes, chewing tobacco, and e-cigarettes. If you need help quitting, ask your doctor.  Wear loose clothes. Do not wear anything tight around your waist.  Raise (elevate) the  head of your bed about 6 inches (15 cm).  Try to lower your stress. If you need help doing this, ask your doctor.  If you are overweight, lose an amount of weight that is healthy for you. Ask your doctor about a safe weight loss goal.  Keep all follow-up visits as told by your doctor. This is important. Contact a doctor if:  You have new symptoms.  You lose weight and you do not know why it is happening.  You have trouble swallowing, or it hurts to swallow.  You have wheezing or a cough that keeps happening.  Your symptoms do not get better with treatment.  You have a hoarse voice. Get help right away if:  You have pain in your arms, neck, jaw, teeth, or back.  You feel sweaty, dizzy, or light-headed.  You have chest pain or shortness of breath.  You throw up (vomit) and your throw up looks like blood or coffee grounds.  You pass out (faint).  Your poop (stool) is bloody or black.  You cannot swallow, drink, or eat. This information is not intended to replace advice given to you by your health care provider. Make sure you discuss any questions you have with your health care provider. Document Released: 07/21/2007 Document Revised: 07/10/2015 Document Reviewed: 05/29/2014 Elsevier Interactive Patient Education  [employer].

## 2017-09-30 NOTE — Op Note (Signed)
[facility] Patient Name: Brian Austin Procedure Date: 09/30/2017 1:53 PM MRN: [id number] Date of Birth: [birth year] Attending MD: Norvel Richards , MD CSN: [id number] Age: 84 Admit Type: Outpatient Procedure:                Upper GI endoscopy Indications:              Dysphagia Providers:                Norvel Richards, MD, Lurline Del, RN Referring MD:              Medicines:                Midazolam 2 mg IV, Meperidine 25 mg IV, Ondansetron                            4 mg IV Complications:            No immediate complications. Estimated Blood Loss:     Estimated blood loss was minimal. Procedure:                Pre-Anesthesia Assessment:                           - Prior to the procedure, a History and Physical                            was performed, and patient medications and                            allergies were reviewed. The patient's tolerance of                            previous anesthesia was also reviewed. The risks                            and benefits of the procedure and the sedation                            options and risks were discussed with the patient.                            All questions were answered, and informed consent                            was obtained. Prior Anticoagulants: The patient has                            taken no previous anticoagulant or antiplatelet                            agents. ASA Grade Assessment: III - A patient with                            severe systemic disease. After reviewing the risks  and benefits, the patient was deemed in                            satisfactory condition to undergo the procedure.                           After obtaining informed consent, the endoscope was                            passed under direct vision. Throughout the                            procedure, the patient's blood pressure, pulse, and                            oxygen  saturations were monitored continuously. The                            [id number]-[id number] ([id number]) scope was introduced through the                            mouth, and advanced to the second part of duodenum.                            The upper GI endoscopy was accomplished without                            difficulty. The patient tolerated the procedure                            well. Scope In: 2:07:23 PM Scope Out: 2:14:26 PM Total Procedure Duration: 0 hours 7 minutes 3 seconds  Findings:      Prominent Schatzki's ring present. Circumferential distal esophageal       erosions with a linear component coming up 10 cm from the GE junction.       No Barrett's epithelium seen. No tumor seen. Moderate hiatal hernia.       Deformity of the antral prepyloric area. No ulcer or infiltrating       process. The scope was withdrawn. Dilation was performed with a Maloney       dilator with mild resistance at 56 Fr. The dilation site was examined       and showed moderate mucosal disruption. Estimated blood loss was minimal.      The duodenal bulb and second portion of the duodenum were normal. Impression:               - Extensive Erosive reflux esophagitis. Schatzki's                            ring; Dilated.                           - Normal duodenal bulb and second portion of the  duodenum.                           - No specimens collected. Moderate Sedation:      Moderate (conscious) sedation was administered by the endoscopy nurse       and supervised by the endoscopist. The following parameters were       monitored: oxygen saturation, heart rate, blood pressure, respiratory       rate, EKG, adequacy of pulmonary ventilation, and response to care.       Total physician intraservice time was 13 minutes. Recommendation:           - Patient has a contact number available for                            emergencies. The signs and symptoms of potential                             delayed complications were discussed with the                            patient. Return to normal activities tomorrow.                            Written discharge instructions were provided to the                            patient.                           - Resume previous diet.                           - Continue present medications. Begin Protonix 40                            mg daily.                           - No repeat upper endoscopy.                           - Return to GI office in 3 months. Procedure Code(s):        --- Professional ---                           [phone], Esophagogastroduodenoscopy, flexible,                            transoral; diagnostic, including collection of                            specimen(s) by brushing or washing, when performed                            (separate procedure)  82956, Dilation of esophagus, by unguided sound or                            bougie, single or multiple passes                           G0500, Moderate sedation services provided by the                            same physician or other qualified health care                            professional performing a gastrointestinal                            endoscopic service that sedation supports,                            requiring the presence of an independent trained                            observer to assist in the monitoring of the                            patient's level of consciousness and physiological                            status; initial 15 minutes of intra-service time;                            patient age 51 years or older (additional time may                            be reported with [phone], as appropriate) Diagnosis Code(s):        --- Professional ---                           K20.9, Esophagitis, unspecified                           R13.10, Dysphagia, unspecified CPT copyright 2017 American Medical  Association. All rights reserved. The codes documented in this report are preliminary and upon coder review may  be revised to meet current compliance requirements. Brian Austin. [name], MD Norvel Richards, MD 09/30/2017 2:23:24 PM This report has been signed electronically. Number of Addenda: 0

## 2017-09-30 NOTE — H&P (Signed)
@LOGO @   Primary Care Physician:  Barry Dienes, NP Primary Gastroenterologist:  Dr. Gala Romney  Pre-Procedure History & Physical: HPI:  Brian Austin is a 84 y.o. male here for evaluation/management of esophageal dysphagia and GERD.  Past Medical History:  Diagnosis Date  . Arthritis   . BPH (benign prostatic hyperplasia)   . CAD (coronary artery disease)    s/p Xience stent ot the distal RAC 2010, PCI with Xience stent to distal RCA at the [facility] 2011  . COPD (chronic obstructive pulmonary disease) ([place]) 05/22/2008   FEV1/FVS 44.9  . Diabetes mellitus    Well controlled  . GERD (gastroesophageal reflux disease)   . Hyperlipidemia   . Hypertension   . Spontaneous pneumothorax   . Squamous cell carcinoma lung ([place]) 2000   CT w/ CM; 04/25/2009 COPD, s/p RLL, CAD, 5 cm ascending aortic aneurysm  . Thoracic ascending aortic aneurysm ([facility])    4.8 cm   . Type II or unspecified type diabetes mellitus without mention of complication, not stated as uncontrolled     Past Surgical History:  Procedure Laterality Date  . BACK SURGERY    . CARDIAC CATHETERIZATION  2010   Stent  . I&D EXTREMITY Left 11/10/2012   Procedure: IRRIGATION AND DEBRIDEMENT Left third-long finger ;  Surgeon: Jolyn Nap, MD;  Location: [place];  Service: Orthopedics;  Laterality: Left;  . LOBECTOMY  2000   Right lower for squamous cell ca  . LUMBAR LAMINECTOMY  1978  . Lymph node resected  1983   Axilla  . Needle removed from the right foot    . Spontaneous pneumothrax  1979    Prior to Admission medications   Medication Sig Start Date End Date Taking? Authorizing Provider  aspirin EC 81 MG tablet Take 81 mg by mouth daily.   Yes [provider]  Hypromellose (ARTIFICIAL TEARS OP) Place 1 drop into both eyes 2 (two) times daily as needed (for dry eyes).   Yes [provider]  losartan (COZAAR) 25 MG tablet Take 25 mg by mouth daily as needed (for BP > 130).  04/14/17  Yes [provider]  magnesium oxide (MAG-OX) 400 MG tablet Take 400 mg by mouth daily.   Yes [provider]  Omega-3 Fatty Acids (FISH OIL) 1200 MG CAPS Take 1,200 mg by mouth daily.    Yes [provider]  Tamsulosin HCl (FLOMAX) 0.4 MG CAPS Take 0.4 mg by mouth daily after supper.    Yes [provider]    Allergies as of 08/30/2017  . (No Known Allergies)    Family History  Problem Relation Age of Onset  . Diabetes Father   . Alcohol abuse Father   . Heart disease Neg Hx   . Colon cancer Neg Hx   . Gastric cancer Neg Hx   . Esophageal cancer Neg Hx     Social History   Socioeconomic History  . Marital status: Divorced    Spouse name: Not on file  . Number of children: Not on file  . Years of education: Not on file  . Highest education level: Not on file  Occupational History  . Occupation: Retired  Scientific laboratory technician  . Financial resource strain: Not on file  . Food insecurity:    Worry: Not on file    Inability: Not on file  . Transportation needs:    Medical: Not on file    Non-medical: Not on file  Tobacco Use  .  Smoking status: Former Smoker    Last attempt to quit: 02/16/1984    Years since quitting: 33.6  . Smokeless tobacco: Never Used  . Tobacco comment: Smoked for 30 years  Substance and Sexual Activity  . Alcohol use: Yes    Comment: wine or beer with supper  . Drug use: No  . Sexual activity: Never  Lifestyle  . Physical activity:    Days per week: Not on file    Minutes per session: Not on file  . Stress: Not on file  Relationships  . Social connections:    Talks on phone: Not on file    Gets together: Not on file    Attends religious service: Not on file    Active member of club or organization: Not on file    Attends meetings of clubs or organizations: Not on file    Relationship status: Not on file  . Intimate partner violence:    Fear of current or ex partner: Not on file    Emotionally abused: Not on file     Physically abused: Not on file    Forced sexual activity: Not on file  Other Topics Concern  . Not on file  Social History Narrative   Retired [employer]   Divorced four times   3 children   Single    Review of Systems: See HPI, otherwise negative ROS  Physical Exam: BP (!) 145/66   Pulse 66   Temp 97.6 F (36.4 C) (Oral)   Resp 10   Ht 5' 7.5" (1.715 m)   Wt 65.5 kg   SpO2 97%   BMI 22.30 kg/m  General:   Alert,  , pleasant and cooperative in NAD Neck:  Supple; no masses or thyromegaly. No significant cervical adenopathy. Lungs:  Clear throughout to auscultation.   No wheezes, crackles, or rhonchi. No acute distress. Heart:  Regular rate and rhythm; no murmurs, clicks, rubs,  or gallops. Abdomen: Non-distended, normal bowel sounds.  Soft and nontender without appreciable mass or hepatosplenomegaly.  Pulses:  Normal pulses noted. Extremities:  Without clubbing or edema.  Impression/Plan:  84 year old gentleman with esophageal dysphagia and GERD. I've offered the patient an EGD today to further evaluate. The risks, benefits, limitations, alternatives and imponderables have been reviewed with the patient. Potential for esophageal dilation, biopsy, etc. have also been reviewed.  Questions have been answered. All parties agreeable.     Notice: This dictation was prepared with Dragon dictation along with smaller phrase technology. Any transcriptional errors that result from this process are unintentional and may not be corrected upon review.

## 2017-10-05 ENCOUNTER — Encounter (HOSPITAL_COMMUNITY): Payer: Self-pay | Admitting: Internal Medicine

## 2017-10-06 ENCOUNTER — Encounter: Payer: Self-pay | Admitting: Cardiology

## 2017-10-29 NOTE — Progress Notes (Signed)
HPI The patient presents for followup of his known coronary disease.  Since I last saw him he has done well.  The patient denies any new symptoms such as chest discomfort, neck or arm discomfort. There has been no new shortness of breath, PND or orthopnea. There have been no reported palpitations, presyncope or syncope.  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  He still gets some dizziness which he has had for years.  However he does not have syncope or presyncope.   No Known Allergies  Current Outpatient Medications  Medication Sig Dispense Refill  . aspirin EC 81 MG tablet Take 81 mg by mouth daily.    . Hypromellose (ARTIFICIAL TEARS OP) Place 1 drop into both eyes 2 (two) times daily as needed (for dry eyes).    Marland Kitchen losartan (COZAAR) 25 MG tablet Take 25 mg by mouth daily as needed (for BP > 130).     . magnesium oxide (MAG-OX) 400 MG tablet Take 400 mg by mouth daily.    . Omega-3 Fatty Acids (FISH OIL) 1200 MG CAPS Take 1,200 mg by mouth daily.     . pantoprazole (PROTONIX) 40 MG tablet Take 40 mg by mouth daily. Take 1 tablet daily  5  . Tamsulosin HCl (FLOMAX) 0.4 MG CAPS Take 0.4 mg by mouth daily after supper.      No current facility-administered medications for this visit.     Past Medical History:  Diagnosis Date  . Arthritis   . BPH (benign prostatic hyperplasia)   . CAD (coronary artery disease)    s/p Xience stent ot the distal RAC 2010, PCI with Xience stent to distal RCA at the [facility] 2011  . COPD (chronic obstructive pulmonary disease) ([place]) 05/22/2008   FEV1/FVS 44.9  . Diabetes mellitus    Well controlled  . GERD (gastroesophageal reflux disease)   . Hyperlipidemia   . Hypertension   . Spontaneous pneumothorax   . 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 Thoracic ascending aortic aneurysm (HCC)    4.8 cm   . Type  II or unspecified type diabetes mellitus without mention of complication, not stated as uncontrolled     Past Surgical History:  Procedure Laterality Date  . BACK SURGERY    . CARDIAC CATHETERIZATION  2010   Stent  . ESOPHAGOGASTRODUODENOSCOPY N/A 09/30/2017   Procedure: ESOPHAGOGASTRODUODENOSCOPY (EGD);  Surgeon: Daneil Dolin, MD;  [facility];  Service: Endoscopy;  Laterality: N/A;  2:15PM  . I&D EXTREMITY Left 11/10/2012   Procedure: IRRIGATION AND DEBRIDEMENT Left third-long finger ;  Surgeon: Jolyn Nap, MD;  Location: [place];  Service: Orthopedics;  Laterality: Left;  . LOBECTOMY  2000   Right lower for squamous cell ca  . LUMBAR LAMINECTOMY  1978  . Lymph node resected  1983   Axilla  . MALONEY DILATION N/A 09/30/2017   Procedure: Venia Minks DILATION;  Surgeon: Daneil Dolin, MD;  [facility];  Service: Endoscopy;  Laterality: N/A;  . Needle removed from the right foot    . Spontaneous pneumothrax  1979    ROS:    Occasional numbness in his arms and hands.   Otherwise as stated in the HPI and negative for all other systems.    PHYSICAL EXAM BP 110/66  Pulse 67   Ht 5' 7.5" (1.715 m)   Wt 149 lb (67.6 kg)   BMI 22.99 kg/m   GENERAL:  Well appearing and looks younger than stated age NECK:  No jugular venous distention, waveform within normal limits, carotid upstroke brisk and symmetric, soft left bruits, no thyromegaly LUNGS:  Clear to auscultation bilaterally CHEST:  Unremarkable HEART:  PMI not displaced or sustained,S1 and S2 within normal limits, no S3, no S4, no clicks, no rubs, no murmurs ABD:  Flat, positive bowel sounds normal in frequency in pitch, no bruits, no rebound, no guarding, no midline pulsatile mass, no hepatomegaly, no splenomegaly EXT:  2 plus pulses throughout, no edema, no cyanosis no clubbing   EKG:  Sinus rhythm, rate 67, right bundle branch block, left anterior fascicular block, no acute ST-T wave changes.  PVCs no  change from previous. 10/31/2017    ASSESSMENT AND PLAN   CAD:  The patient has no new sypmtoms.  No further cardiovascular testing is indicated.  We will continue with aggressive risk reduction and meds as listed.  HTN:  The blood pressure is at target.  No change in therapy.   AORTIC ANEURYSM:   This was 4.6 on CT last year.  This is followed by Dr. Roxan Hockey and has been stable for years.   DYSLIPIDEMIA:  I will obtain the results from his PCP.   CAROTID STENOSIS:    I will check this in follow up in 2020 when he sees me back.

## 2017-10-31 ENCOUNTER — Ambulatory Visit (INDEPENDENT_AMBULATORY_CARE_PROVIDER_SITE_OTHER): Payer: Medicare Other | Admitting: Cardiology

## 2017-10-31 ENCOUNTER — Encounter: Payer: Self-pay | Admitting: Cardiology

## 2017-10-31 VITALS — BP 110/66 | HR 67 | Ht 67.5 in | Wt 149.0 lb

## 2017-10-31 DIAGNOSIS — I6523 Occlusion and stenosis of bilateral carotid arteries: Secondary | ICD-10-CM

## 2017-10-31 DIAGNOSIS — I259 Chronic ischemic heart disease, unspecified: Secondary | ICD-10-CM

## 2017-10-31 DIAGNOSIS — I77810 Thoracic aortic ectasia: Secondary | ICD-10-CM | POA: Diagnosis not present

## 2017-10-31 DIAGNOSIS — E785 Hyperlipidemia, unspecified: Secondary | ICD-10-CM | POA: Diagnosis not present

## 2017-10-31 DIAGNOSIS — I10 Essential (primary) hypertension: Secondary | ICD-10-CM | POA: Diagnosis not present

## 2017-10-31 MED ORDER — NITROGLYCERIN 0.4 MG SL SUBL: 0.4000 mg | SUBLINGUAL_TABLET | SUBLINGUAL | 3 refills | Status: AC | PRN

## 2017-10-31 NOTE — Patient Instructions (Signed)
Medication Instructions:  Your physician has recommended you make the following change in your medication:   An Rx for sublingual Nitro-glycerin has been sent to your pharmacy. Use as needed as directed.   Labwork: None ordered  Testing/Procedures: Your physician has requested that you have a carotid duplex. This test is an ultrasound of the carotid arteries in your neck. It looks at blood flow through these arteries that supply the brain with blood. Allow one hour for this exam. There are no restrictions or special instructions. ( This will be scheduled when I see you next year)  Follow-Up: Your physician wants you to follow-up in: 1 year with Dr.Hochrein You will receive a reminder letter in the mail two months in advance. If you don't receive a letter, please call our office to schedule the follow-up appointment.   Any Other Special Instructions Will Be Listed Below (If Applicable).     If you need a refill on your cardiac medications before your next appointment, please call your pharmacy.   Nitroglycerin sublingual tablets What is this medicine? NITROGLYCERIN (nye troe GLI ser in) is a type of vasodilator. It relaxes blood vessels, increasing the blood and oxygen supply to your heart. This medicine is used to relieve chest pain caused by angina. It is also used to prevent chest pain before activities like climbing stairs, going outdoors in cold weather, or sexual activity. This medicine may be used for other purposes; ask your health care provider or pharmacist if you have questions. COMMON BRAND NAME(S): Nitroquick, Nitrostat, Nitrotab What should I tell my health care provider before I take this medicine? They need to know if you have any of these conditions: -anemia -head injury, recent stroke, or bleeding in the brain -liver disease -previous heart attack -an unusual or allergic reaction to nitroglycerin, other medicines, foods, dyes, or preservatives -pregnant or trying  to get pregnant -breast-feeding How should I use this medicine? Take this medicine by mouth as needed. At the first sign of an angina attack (chest pain or tightness) place one tablet under your tongue. You can also take this medicine 5 to 10 minutes before an event likely to produce chest pain. Follow the directions on the prescription label. Let the tablet dissolve under the tongue. Do not swallow whole. Replace the dose if you accidentally swallow it. It will help if your mouth is not dry. Saliva around the tablet will help it to dissolve more quickly. Do not eat or drink, smoke or chew tobacco while a tablet is dissolving. If you are not better within 5 minutes after taking ONE dose of nitroglycerin, call 9-1-1 immediately to seek emergency medical care. Do not take more than 3 nitroglycerin tablets over 15 minutes. If you take this medicine often to relieve symptoms of angina, your doctor or health care professional may provide you with different instructions to manage your symptoms. If symptoms do not go away after following these instructions, it is important to call 9-1-1 immediately. Do not take more than 3 nitroglycerin tablets over 15 minutes. Talk to your pediatrician regarding the use of this medicine in children. Special care may be needed. Overdosage: If you think you have taken too much of this medicine contact a poison control center or emergency room at once. NOTE: This medicine is only for you. Do not share this medicine with others. What if I miss a dose? This does not apply. This medicine is only used as needed. What may interact with this medicine? Do not take  this medicine with any of the following medications: -certain migraine medicines like ergotamine and dihydroergotamine (DHE) -medicines used to treat erectile dysfunction like sildenafil, tadalafil, and vardenafil -riociguat This medicine may also interact with the following  medications: -alteplase -aspirin -heparin -medicines for high blood pressure -medicines for mental depression -other medicines used to treat angina -phenothiazines like chlorpromazine, mesoridazine, prochlorperazine, thioridazine This list may not describe all possible interactions. Give your health care provider a list of all the medicines, herbs, non-prescription drugs, or dietary supplements you use. Also tell them if you smoke, drink alcohol, or use illegal drugs. Some items may interact with your medicine. What should I watch for while using this medicine? Tell your doctor or health care professional if you feel your medicine is no longer working. Keep this medicine with you at all times. Sit or lie down when you take your medicine to prevent falling if you feel dizzy or faint after using it. Try to remain calm. This will help you to feel better faster. If you feel dizzy, take several deep breaths and lie down with your feet propped up, or bend forward with your head resting between your knees. You may get drowsy or dizzy. Do not drive, use machinery, or do anything that needs mental alertness until you know how this drug affects you. Do not stand or sit up quickly, especially if you are an older patient. This reduces the risk of dizzy or fainting spells. Alcohol can make you more drowsy and dizzy. Avoid alcoholic drinks. Do not treat yourself for coughs, colds, or pain while you are taking this medicine without asking your doctor or health care professional for advice. Some ingredients may increase your blood pressure. What side effects may I notice from receiving this medicine? Side effects that you should report to your doctor or health care professional as soon as possible: -blurred vision -dry mouth -skin rash -sweating -the feeling of extreme pressure in the head -unusually weak or tired Side effects that usually do not require medical attention (report to your doctor or health care  professional if they continue or are bothersome): -flushing of the face or neck -headache -irregular heartbeat, palpitations -nausea, vomiting This list may not describe all possible side effects. Call your doctor for medical advice about side effects. You may report side effects to FDA at 1-800-FDA-1088. Where should I keep my medicine? Keep out of the reach of children. Store at room temperature between 20 and 25 degrees C (68 and 77 degrees F). Store in [employer]. Protect from light and moisture. Keep tightly closed. Throw away any unused medicine after the expiration date. NOTE: This sheet is a summary. It may not cover all possible information. If you have questions about this medicine, talk to your doctor, pharmacist, or health care provider.  2018 Elsevier/Gold Standard (2012-11-30 17:57:36)

## 2017-11-02 ENCOUNTER — Telehealth: Payer: Self-pay | Admitting: Cardiology

## 2017-11-02 NOTE — Telephone Encounter (Signed)
New Message:   Patient is calling for the EKG results

## 2017-11-02 NOTE — Telephone Encounter (Signed)
Spoke with pt. Adv pt that his EKG was performed during his ov with Dr.Hochrein on 10/31/17   Per Dr.Hochrein's note  EKG:  Sinus rhythm, rate 67, right bundle branch block, left anterior fascicular block, no acute ST-T wave changes.  PVCs no change from previous. 10/31/2017    Pt verbalized understanding and will f/u as planned in 1 year. Adv pt to contact the office sooner if cardiac symptoms develop

## 2017-11-14 ENCOUNTER — Other Ambulatory Visit: Payer: Self-pay | Admitting: *Deleted

## 2017-11-14 DIAGNOSIS — I712 Thoracic aortic aneurysm, without rupture, unspecified: Secondary | ICD-10-CM

## 2018-01-03 ENCOUNTER — Ambulatory Visit
Admission: RE | Admit: 2018-01-03 | Discharge: 2018-01-03 | Disposition: A | Discharge status: Home / Self Care | Payer: Medicare Other | Source: Ambulatory Visit | Attending: Thoracic Surgery (Cardiothoracic Vascular Surgery) | Admitting: Thoracic Surgery (Cardiothoracic Vascular Surgery)

## 2018-01-03 ENCOUNTER — Ambulatory Visit: Payer: Medicare Other | Admitting: Nurse Practitioner

## 2018-01-03 ENCOUNTER — Encounter: Payer: Self-pay | Admitting: Thoracic Surgery (Cardiothoracic Vascular Surgery)

## 2018-01-03 ENCOUNTER — Ambulatory Visit (INDEPENDENT_AMBULATORY_CARE_PROVIDER_SITE_OTHER): Payer: Medicare Other | Admitting: Thoracic Surgery (Cardiothoracic Vascular Surgery)

## 2018-01-03 ENCOUNTER — Other Ambulatory Visit: Payer: Self-pay

## 2018-01-03 VITALS — BP 116/52 | HR 91 | Resp 18 | Ht 67.5 in | Wt 145.0 lb

## 2018-01-03 DIAGNOSIS — I712 Thoracic aortic aneurysm, without rupture, unspecified: Secondary | ICD-10-CM

## 2018-01-03 DIAGNOSIS — I259 Chronic ischemic heart disease, unspecified: Secondary | ICD-10-CM | POA: Diagnosis not present

## 2018-01-03 MED ORDER — IOPAMIDOL (ISOVUE-370) INJECTION 76%
75.0000 mL | Freq: Once | INTRAVENOUS | Status: AC | PRN
  Administered 2018-01-03: 75 mL via INTRAVENOUS

## 2018-01-03 NOTE — Progress Notes (Signed)
[address]       [place] [address]             [phone]     HPI: [name] returns for an annual follow-up visit  Sadie Brian Austin is an 84 year old man with a past medical history significant for an ascending thoracic aneurysm, coronary artery disease, hypertension, hyperlipidemia, type 2 diabetes, lobectomy for squamous cell carcinoma of the lung, COPD, and gastroesophageal reflux.  He was first noted to have a 4.8 cm ascending aneurysm on a CT scan in 2010.  Looking back in reviewing previous scans that have been present in 2004.  It was the same size it just had not been noted.  He has been followed on a regular basis since 2010.  In the interim since his last visit he feels well.  He is not having any chest pain, pressure, or tightness.  He is not having any dizziness.  He does say it just takes him longer to do everything now.  He checks his blood pressure at least daily and is generally in the 086P systolics.  Past Medical History:  Diagnosis Date  . Arthritis   . BPH (benign prostatic hyperplasia)   . CAD (coronary artery disease)    s/p Xience stent ot the distal RAC 2010, PCI with Xience stent to distal RCA at the [facility] 2011  . COPD (chronic obstructive pulmonary disease) ([place]) 05/22/2008   FEV1/FVS 44.9  . Diabetes mellitus    Well controlled  . GERD (gastroesophageal reflux disease)   . Hyperlipidemia   . Hypertension   . Spontaneous pneumothorax   . Squamous cell carcinoma lung ([place]) 2000   CT w/ CM; 04/25/2009 COPD, s/p RLL, CAD, 5 cm ascending aortic aneurysm  . Thoracic ascending aortic aneurysm ([facility])    4.8 cm   . Type II or unspecified type diabetes mellitus without mention of complication, not stated as uncontrolled     Current Outpatient Medications  Medication Sig Dispense Refill  . aspirin EC 81 MG tablet Take 81 mg by mouth daily.    . Hypromellose (ARTIFICIAL TEARS OP) Place 1 drop into both eyes 2 (two) times daily as  needed (for dry eyes).    Marland Kitchen losartan (COZAAR) 25 MG tablet Take 25 mg by mouth daily as needed (for BP > 130).     . magnesium oxide (MAG-OX) 400 MG tablet Take 400 mg by mouth daily.    . nitroGLYCERIN (NITROSTAT) 0.4 MG SL tablet Place 1 tablet (0.4 mg total) under the tongue every 5 (five) minutes as needed. 25 tablet 3  . Omega-3 Fatty Acids (FISH OIL) 1200 MG CAPS Take 1,200 mg by mouth daily.     . pantoprazole (PROTONIX) 40 MG tablet Take 40 mg by mouth daily. Take 1 tablet daily  5  . Tamsulosin HCl (FLOMAX) 0.4 MG CAPS Take 0.4 mg by mouth daily after supper.      No current facility-administered medications for this visit.     Physical Exam BP (!) 116/52 (BP Location: Left Arm, Patient Position: Sitting, Cuff Size: Normal)   Pulse 91   Resp 18   Ht 5' 7.5" (1.715 m)   Wt 145 lb (65.8 kg)   SpO2 95% Comment: RA  BMI 22.36 kg/m  84 year old man in no acute distress Well-developed and well-nourished Alert and oriented x3 with no focal neurologic deficits Faint left carotid bruit, no cervical adenopathy Cardiac regular rate and rhythm with a 2/6  systolic murmur Lungs diminished at right base, otherwise clear  Diagnostic Tests: CT ANGIOGRAPHY CHEST WITH CONTRAST  TECHNIQUE: Multidetector CT imaging of the chest was performed using the standard protocol during bolus administration of intravenous contrast. Multiplanar CT image reconstructions and MIPs were obtained to evaluate the vascular anatomy.  CONTRAST:  64mL ISOVUE-370 IOPAMIDOL (ISOVUE-370) INJECTION 76%  Creatinine was obtained on site at [facility] at [address]  Results: Creatinine 0.8 mg/dL.  GFR 81  COMPARISON:  01/11/2017 and previous  FINDINGS: Cardiovascular: Heart size normal. Limited opacification of pulmonary artery branches; the exam was not optimized for detection of pulmonary emboli. Scattered coronary calcifications. Aortic valve leaflet coarse calcifications. There is  good contrast opacification of the thoracic aorta with transverse dimensions as follows:  4.5 cm sinuses of Valsalva  4 cm sino-tubular junction  4.7 cm mid ascending (previously 4.6)  3.5 cm distal ascending/proximal arch  2.7 cm distal arch  2.5 cm proximal descending  2.1 cm distal descending  No dissection or stenosis. Classic 3 vessel brachiocephalic arterial origin anatomy without proximal stenosis. Partially calcified plaque in the arch and descending thoracic segment. Visualized proximal abdominal aorta unremarkable.  Mediastinum/Nodes: No hilar or mediastinal adenopathy.  Lungs/Pleura: No pleural effusion. No pneumothorax. Emphysema (ICD10-J43.9). No nodule or infiltrate.  Upper Abdomen: No acute findings. 2.2 cm exophytic upper pole right renal cyst.  Musculoskeletal: Anterior vertebral endplate spurring at multiple levels in the mid and lower thoracic spine.  Review of the MIP images confirms the above findings.  IMPRESSION: 1. 4.7 cm ascending thoracic aortic aneurysm (previously 4.6). Recommend semi-annual imaging followup by CTA or MRA and referral to cardiothoracic surgery if not already obtained. This recommendation follows 2010 ACCF/AHA/AATS/ACR/ASA/SCA/SCAI/SIR/STS/SVM Guidelines for the Diagnosis and Management of Patients With Thoracic Aortic Disease. Circulation. 2010; 121: E423-N361 2. Aortic valve leaflet calcifications. Correlate with any echocardiographic evidence of stenosis or insufficiency. 3. Coronary calcifications. The severity of coronary artery disease and any potential stenosis cannot be assessed on this non-gated CT examination. Assessment for potential risk factor modification, dietary therapy or pharmacologic therapy may be warranted, if clinically indicated. 4. Pulmonary emphysema   Electronically Signed   By: Lucrezia Europe M.D.   On: 01/03/2018 13:39 I personally reviewed the CT images and concur with the  findings noted above  Impression: Brian Austin is an 84 year old gentleman with an ascending thoracic aneurysm, coronary artery disease, hypertension, hyperlipidemia, type 2 diabetes, lobectomy for squamous cell carcinoma of the lung, COPD, and gastroesophageal reflux.   Radiology first noted an ascending aneurysm on a CT in 2010.  It actually dates back to at least 2004.  It has been unchanged over the past 15 years.  I again discussed with him that the guidelines recommend every 6 months for an aneurysm of the size.  Given that he has been followed on an annual basis for 15 years without change he is not interested in increasing the surveillance at the current time.  I will plan to see him back in a year.  Hypertension-blood pressure well controlled on current regimen.  He follows this on a daily basis.  Left carotid bruit-no hemodynamically significant stenosis on an ultrasound last year.  Dr. Percival Spanish plans to re-image that in 2020.  Plan: Return in 1 year with CT Angio of chest  Brian Nakayama, MD [facility] [phone]

## 2018-01-17 ENCOUNTER — Emergency Department (HOSPITAL_COMMUNITY): Payer: Medicare Other

## 2018-01-17 ENCOUNTER — Observation Stay (HOSPITAL_COMMUNITY): Payer: Medicare Other

## 2018-01-17 ENCOUNTER — Other Ambulatory Visit: Payer: Self-pay

## 2018-01-17 ENCOUNTER — Observation Stay (HOSPITAL_COMMUNITY)
Admission: EM | Admit: 2018-01-17 | Discharge: 2018-01-18 | Disposition: A | Discharge status: Home / Self Care | Payer: Medicare Other | Attending: Internal Medicine | Admitting: Internal Medicine

## 2018-01-17 ENCOUNTER — Encounter (HOSPITAL_COMMUNITY): Payer: Self-pay | Admitting: Emergency Medicine

## 2018-01-17 DIAGNOSIS — E785 Hyperlipidemia, unspecified: Secondary | ICD-10-CM | POA: Diagnosis not present

## 2018-01-17 DIAGNOSIS — I10 Essential (primary) hypertension: Secondary | ICD-10-CM | POA: Diagnosis not present

## 2018-01-17 DIAGNOSIS — I2510 Atherosclerotic heart disease of native coronary artery without angina pectoris: Secondary | ICD-10-CM | POA: Diagnosis not present

## 2018-01-17 DIAGNOSIS — M6281 Muscle weakness (generalized): Secondary | ICD-10-CM | POA: Insufficient documentation

## 2018-01-17 DIAGNOSIS — E119 Type 2 diabetes mellitus without complications: Secondary | ICD-10-CM | POA: Insufficient documentation

## 2018-01-17 DIAGNOSIS — R42 Dizziness and giddiness: Secondary | ICD-10-CM | POA: Diagnosis present

## 2018-01-17 DIAGNOSIS — G459 Transient cerebral ischemic attack, unspecified: Secondary | ICD-10-CM | POA: Diagnosis present

## 2018-01-17 DIAGNOSIS — I712 Thoracic aortic aneurysm, without rupture: Secondary | ICD-10-CM | POA: Insufficient documentation

## 2018-01-17 DIAGNOSIS — Z955 Presence of coronary angioplasty implant and graft: Secondary | ICD-10-CM | POA: Diagnosis not present

## 2018-01-17 DIAGNOSIS — I77810 Thoracic aortic ectasia: Secondary | ICD-10-CM | POA: Diagnosis present

## 2018-01-17 DIAGNOSIS — K219 Gastro-esophageal reflux disease without esophagitis: Secondary | ICD-10-CM | POA: Insufficient documentation

## 2018-01-17 DIAGNOSIS — I6523 Occlusion and stenosis of bilateral carotid arteries: Secondary | ICD-10-CM | POA: Diagnosis not present

## 2018-01-17 DIAGNOSIS — Z85118 Personal history of other malignant neoplasm of bronchus and lung: Secondary | ICD-10-CM | POA: Diagnosis not present

## 2018-01-17 DIAGNOSIS — K5900 Constipation, unspecified: Secondary | ICD-10-CM | POA: Diagnosis not present

## 2018-01-17 DIAGNOSIS — Z87891 Personal history of nicotine dependence: Secondary | ICD-10-CM | POA: Insufficient documentation

## 2018-01-17 DIAGNOSIS — Z7982 Long term (current) use of aspirin: Secondary | ICD-10-CM | POA: Diagnosis not present

## 2018-01-17 DIAGNOSIS — J449 Chronic obstructive pulmonary disease, unspecified: Secondary | ICD-10-CM | POA: Diagnosis not present

## 2018-01-17 DIAGNOSIS — Z79899 Other long term (current) drug therapy: Secondary | ICD-10-CM | POA: Diagnosis not present

## 2018-01-17 DIAGNOSIS — N400 Enlarged prostate without lower urinary tract symptoms: Secondary | ICD-10-CM | POA: Diagnosis not present

## 2018-01-17 LAB — MAGNESIUM: Magnesium: 2.3 mg/dL (ref 1.7–2.4)

## 2018-01-17 LAB — URINALYSIS, ROUTINE W REFLEX MICROSCOPIC
Bilirubin Urine: NEGATIVE
GLUCOSE, UA: NEGATIVE mg/dL
HGB URINE DIPSTICK: NEGATIVE
Ketones, ur: NEGATIVE mg/dL
Leukocytes, UA: NEGATIVE
Nitrite: NEGATIVE
Protein, ur: NEGATIVE mg/dL
Specific Gravity, Urine: 1.031 — ABNORMAL HIGH (ref 1.005–1.030)
pH: 6 (ref 5.0–8.0)

## 2018-01-17 LAB — PROTIME-INR
INR: 1.03
Prothrombin Time: 13.4 seconds (ref 11.4–15.2)

## 2018-01-17 LAB — RAPID URINE DRUG SCREEN, HOSP PERFORMED
Amphetamines: NOT DETECTED
BENZODIAZEPINES: NOT DETECTED
Barbiturates: NOT DETECTED
Cocaine: NOT DETECTED
Opiates: NOT DETECTED
Tetrahydrocannabinol: NOT DETECTED

## 2018-01-17 LAB — CBC
HCT: 46.3 % (ref 39.0–52.0)
Hemoglobin: 14.5 g/dL (ref 13.0–17.0)
MCH: 28.5 pg (ref 26.0–34.0)
MCHC: 31.3 g/dL (ref 30.0–36.0)
MCV: 91 fL (ref 80.0–100.0)
Platelets: 188 10*3/uL (ref 150–400)
RBC: 5.09 MIL/uL (ref 4.22–5.81)
RDW: 13.8 % (ref 11.5–15.5)
WBC: 7.3 10*3/uL (ref 4.0–10.5)
nRBC: 0 % (ref 0.0–0.2)

## 2018-01-17 LAB — DIFFERENTIAL
Abs Immature Granulocytes: 0.01 10*3/uL (ref 0.00–0.07)
BASOS PCT: 1 %
Basophils Absolute: 0 10*3/uL (ref 0.0–0.1)
Eosinophils Absolute: 0.1 10*3/uL (ref 0.0–0.5)
Eosinophils Relative: 1 %
Immature Granulocytes: 0 %
Lymphocytes Relative: 16 %
Lymphs Abs: 1.2 10*3/uL (ref 0.7–4.0)
MONOS PCT: 9 %
Monocytes Absolute: 0.6 10*3/uL (ref 0.1–1.0)
NEUTROS PCT: 73 %
Neutro Abs: 5.4 10*3/uL (ref 1.7–7.7)

## 2018-01-17 LAB — COMPREHENSIVE METABOLIC PANEL
ALT: 17 U/L (ref 0–44)
AST: 19 U/L (ref 15–41)
Albumin: 4 g/dL (ref 3.5–5.0)
Alkaline Phosphatase: 65 U/L (ref 38–126)
Anion gap: 7 (ref 5–15)
BUN: 18 mg/dL (ref 8–23)
CO2: 26 mmol/L (ref 22–32)
Calcium: 9.2 mg/dL (ref 8.9–10.3)
Chloride: 107 mmol/L (ref 98–111)
Creatinine, Ser: 0.95 mg/dL (ref 0.61–1.24)
GFR calc Af Amer: >60 mL/min (ref >60)
Glucose, Bld: 110 mg/dL — ABNORMAL HIGH (ref 70–99)
Potassium: 3.9 mmol/L (ref 3.5–5.1)
Sodium: 140 mmol/L (ref 135–145)
Total Bilirubin: 0.4 mg/dL (ref 0.3–1.2)
Total Protein: 7.2 g/dL (ref 6.5–8.1)

## 2018-01-17 LAB — I-STAT CHEM 8, ED
BUN: 18 mg/dL (ref 8–23)
CREATININE: 0.9 mg/dL (ref 0.61–1.24)
Calcium, Ion: 1.2 mmol/L (ref 1.15–1.40)
Chloride: 103 mmol/L (ref 98–111)
Glucose, Bld: 121 mg/dL — ABNORMAL HIGH (ref 70–99)
HCT: 44 % (ref 39.0–52.0)
Hemoglobin: 15 g/dL (ref 13.0–17.0)
Potassium: 4.1 mmol/L (ref 3.5–5.1)
Sodium: 141 mmol/L (ref 135–145)
TCO2: 28 mmol/L (ref 22–32)

## 2018-01-17 LAB — I-STAT TROPONIN, ED: TROPONIN I, POC: 0 ng/mL (ref 0.00–0.08)

## 2018-01-17 LAB — APTT: aPTT: 32 seconds (ref 24–36)

## 2018-01-17 LAB — PHOSPHORUS: Phosphorus: 3.3 mg/dL (ref 2.5–4.6)

## 2018-01-17 LAB — ETHANOL: Alcohol, Ethyl (B): <10 mg/dL (ref <10)

## 2018-01-17 MED ORDER — STROKE: EARLY STAGES OF RECOVERY BOOK
Freq: Once | Status: AC
  Administered 2018-01-17
  Filled 2018-01-17 (×2): qty 1

## 2018-01-17 MED ORDER — ASPIRIN 300 MG RE SUPP: 300.0000 mg | Freq: Every day | RECTAL | Status: DC

## 2018-01-17 MED ORDER — SODIUM CHLORIDE 0.9 % IV SOLN
INTRAVENOUS | Status: AC
  Administered 2018-01-18: 02:00:00 via INTRAVENOUS

## 2018-01-17 MED ORDER — IOPAMIDOL (ISOVUE-370) INJECTION 76%
75.0000 mL | Freq: Once | INTRAVENOUS | Status: AC | PRN
  Administered 2018-01-17: 75 mL via INTRAVENOUS

## 2018-01-17 MED ORDER — ACETAMINOPHEN 160 MG/5ML PO SOLN: 650.0000 mg | ORAL | Status: DC | PRN

## 2018-01-17 MED ORDER — ENOXAPARIN SODIUM 40 MG/0.4ML ~~LOC~~ SOLN: 40.0000 mg | SUBCUTANEOUS | Status: DC

## 2018-01-17 MED ORDER — ACETAMINOPHEN 325 MG PO TABS: 650.0000 mg | ORAL_TABLET | ORAL | Status: DC | PRN

## 2018-01-17 MED ORDER — ASPIRIN 325 MG PO TABS
325.0000 mg | ORAL_TABLET | Freq: Every day | ORAL | Status: DC
  Administered 2018-01-18: 325 mg via ORAL
  Filled 2018-01-17: qty 1

## 2018-01-17 MED ORDER — ENOXAPARIN SODIUM 40 MG/0.4ML ~~LOC~~ SOLN
40.0000 mg | SUBCUTANEOUS | Status: DC
  Administered 2018-01-18: 40 mg via SUBCUTANEOUS
  Filled 2018-01-17: qty 0.4

## 2018-01-17 MED ORDER — ACETAMINOPHEN 650 MG RE SUPP: 650.0000 mg | RECTAL | Status: DC | PRN

## 2018-01-17 NOTE — ED Triage Notes (Signed)
Pt c/o of dizziness and dry mouth that started tonight. Pt states he feel groggy.

## 2018-01-17 NOTE — H&P (Signed)
History and Physical    Brian Austin NWG:[id number] DOB: 11-Apr-1930 DOA: 01/17/2018  PCP: Barry Dienes, NP   Patient coming from: Home.   I have personally briefly reviewed patient's old medical records in [place]  Chief Complaint: Dizziness and dry mouth.  HPI: Brian Austin is a 84 y.o. male with medical history significant of osteoarthritis, BPH, history of CAD, stent placement x2, type 2 diabetes, GERD, hyperlipidemia, hypertension, history of squamous cell lung carcinoma, history of spontaneous pneumothorax, history of thoracic ascending aortic aneurysm who is coming to the emergency department due to unsteady gait, grogginess and dry mouth as started earlier in the evening.  He has some trouble finding words, but this has since then resolved.  No headache, vision changes, chest pain, palpitations, diaphoresis, dyspnea, PND, orthopnea or pitting edema of the lower extremities.  He has frequent rhinorrhea, which she attributes to allergies.  He denies sore throat, wheezing, hemoptysis, abdominal pain, nausea, emesis, melena or hematochezia.  He has occasional diarrhea and relatively frequent constipation.  Positive nocturia, about 3 times at night, but denies dysuria or hematuria.  Denies polyuria, polydipsia or polyphagia.  ED Course: Initial vital signs temperature 97.2 F, pulse 77, respirations 17, blood pressure 139/62 mmHg and O2 sat 96% on room air.  His urinalysis showed mild increase on the specific gravity, but was otherwise normal.  UDS was negative.  His CBC, PT/INR/PTT were within normal values.  Troponin was normal.  Alcohol level unremarkable.  CMP showed a glucose of 110 mg/dL, all other values are within normal limits.  Magnesium and phosphorus were normal.  Review of Systems: As per HPI otherwise 10 point review of systems negative.  Past Medical History:  Diagnosis Date  . Arthritis   . BPH (benign prostatic hyperplasia)   . CAD (coronary artery disease)     s/p Xience stent ot the distal RAC 2010, PCI with Xience stent to distal RCA at the [facility] 2011  . COPD (chronic obstructive pulmonary disease) ([place]) 05/22/2008   FEV1/FVS 44.9  . Diabetes mellitus    Well controlled  . GERD (gastroesophageal reflux disease)   . Hyperlipidemia   . Hypertension   . Spontaneous pneumothorax   . Squamous cell carcinoma lung ([place]) 2000   CT w/ CM; 04/25/2009 COPD, s/p RLL, CAD, 5 cm ascending aortic aneurysm  . Thoracic ascending aortic aneurysm (HCC)    4.8 cm   . Type II or unspecified type diabetes mellitus without mention of complication, not stated as uncontrolled     Past Surgical History:  Procedure Laterality Date  . BACK SURGERY    . CARDIAC CATHETERIZATION  2010   Stent  . ESOPHAGOGASTRODUODENOSCOPY N/A 09/30/2017   Procedure: ESOPHAGOGASTRODUODENOSCOPY (EGD);  Surgeon: Daneil Dolin, MD;  [facility];  Service: Endoscopy;  Laterality: N/A;  2:15PM  . I&D EXTREMITY Left 11/10/2012   Procedure: IRRIGATION AND DEBRIDEMENT Left third-long finger ;  Surgeon: Jolyn Nap, MD;  Location: [facility];  Service: Orthopedics;  Laterality: Left;  . LOBECTOMY  2000   Right lower for squamous cell ca  . LUMBAR LAMINECTOMY  1978  . Lymph node resected  1983   Axilla  . MALONEY DILATION N/A 09/30/2017   Procedure: Venia Minks DILATION;  Surgeon: Daneil Dolin, MD;  [facility];  Service: Endoscopy;  Laterality: N/A;  . Needle removed from the right foot    . Spontaneous pneumothrax  1979     reports that he quit  smoking about 33 years ago. He has never used smokeless tobacco. He reports that he drinks alcohol. He reports that he does not use drugs.  No Known Allergies  Family History  Problem Relation Age of Onset  . Diabetes Father   . Alcohol abuse Father   . Heart disease Neg Hx   . Colon cancer Neg Hx   . Gastric cancer Neg Hx   . Esophageal cancer Neg Hx    Prior to Admission medications   Medication Sig Start Date  End Date Taking? Authorizing Provider  aspirin EC 81 MG tablet Take 81 mg by mouth daily.    [provider]  Hypromellose (ARTIFICIAL TEARS OP) Place 1 drop into both eyes 2 (two) times daily as needed (for dry eyes).    [provider]  losartan (COZAAR) 25 MG tablet Take 25 mg by mouth daily as needed (for BP > 130).  04/14/17   [provider]  magnesium oxide (MAG-OX) 400 MG tablet Take 400 mg by mouth daily.    [provider]  nitroGLYCERIN (NITROSTAT) 0.4 MG SL tablet Place 1 tablet (0.4 mg total) under the tongue every 5 (five) minutes as needed. 10/31/17   Minus Breeding, MD  Omega-3 Fatty Acids (FISH OIL) 1200 MG CAPS Take 1,200 mg by mouth daily.     [provider]  pantoprazole (PROTONIX) 40 MG tablet Take 40 mg by mouth daily. Take 1 tablet daily 10/03/17   [provider]  Tamsulosin HCl (FLOMAX) 0.4 MG CAPS Take 0.4 mg by mouth daily after supper.     [provider]    Physical Exam: Vitals:   01/17/18 2245 01/17/18 2300 01/17/18 2315 01/17/18 2330  BP: 136/67 133/77  135/75  Pulse: 78 85 80 80  Resp: 15 19 12 12   Temp:      SpO2: 97% 98% 98% 97%  Weight:      Height:        Constitutional: NAD, calm, comfortable Eyes: PERRL, lids and conjunctivae normal ENMT: Mucous membranes are moist. Posterior pharynx clear of any exudate or lesions. Neck: normal, supple, no masses, no thyromegaly Respiratory: clear to auscultation bilaterally, no wheezing, no crackles. Normal respiratory effort. No accessory muscle use.  Cardiovascular: Regular rate and rhythm, no murmurs / rubs / gallops. No extremity edema. 2+ pedal pulses. No carotid bruits.  Abdomen: Soft, no tenderness, no masses palpated. No hepatosplenomegaly. Bowel sounds positive.  Musculoskeletal: no clubbing / cyanosis. Good ROM, no contractures. Normal muscle tone.  Skin: There are some small comedone-like lesions on scalp and posterior cervical  area. Neurologic: CN 2-12 grossly intact. Sensation intact, DTR normal.  Coordination with nose to finger is intact.  There is no pronator drift.  Strength 5/5 in all 4.  Gait evaluation was deferred. Psychiatric: Normal judgment and insight. Alert and oriented x 4. Normal mood.   Labs on Admission: I have personally reviewed following labs and imaging studies  CBC: Recent Labs  Lab 01/17/18 2213 01/17/18 2306  WBC 7.3  --   NEUTROABS 5.4  --   HGB 14.5 15.0  HCT 46.3 44.0  MCV 91.0  --   PLT 188  --    Basic Metabolic Panel: Recent Labs  Lab 01/17/18 2213 01/17/18 2306  NA 140 141  K 3.9 4.1  CL 107 103  CO2 26  --   GLUCOSE 110* 121*  BUN 18 18  CREATININE 0.95 0.90  CALCIUM 9.2  --    GFR:  Estimated Creatinine Clearance: 55.8 mL/min (by C-G formula based on SCr of 0.9 mg/dL). Liver Function Tests: Recent Labs  Lab 01/17/18 2213  AST 19  ALT 17  ALKPHOS 65  BILITOT 0.4  PROT 7.2  ALBUMIN 4.0   No results for input(s): LIPASE, AMYLASE in the last 168 hours. No results for input(s): AMMONIA in the last 168 hours. Coagulation Profile: Recent Labs  Lab 01/17/18 2213  INR 1.03   Cardiac Enzymes: No results for input(s): CKTOTAL, CKMB, CKMBINDEX, TROPONINI in the last 168 hours. BNP (last 3 results) No results for input(s): PROBNP in the last 8760 hours. HbA1C: No results for input(s): HGBA1C in the last 72 hours. CBG: No results for input(s): GLUCAP in the last 168 hours. Lipid Profile: No results for input(s): CHOL, HDL, LDLCALC, TRIG, CHOLHDL, LDLDIRECT in the last 72 hours. Thyroid Function Tests: No results for input(s): TSH, T4TOTAL, FREET4, T3FREE, THYROIDAB in the last 72 hours. Anemia Panel: No results for input(s): VITAMINB12, FOLATE, FERRITIN, TIBC, IRON, RETICCTPCT in the last 72 hours. Urine analysis:    Component Value Date/Time   COLORURINE YELLOW 01/17/2018 2141   APPEARANCEUR CLEAR 01/17/2018 2141   LABSPEC 1.031 (H) 01/17/2018 2141    PHURINE 6.0 01/17/2018 2141   GLUCOSEU NEGATIVE 01/17/2018 2141   HGBUR NEGATIVE 01/17/2018 2141   BILIRUBINUR NEGATIVE 01/17/2018 2141   KETONESUR NEGATIVE 01/17/2018 2141   PROTEINUR NEGATIVE 01/17/2018 2141   UROBILINOGEN 0.2 10/08/2011 0925   NITRITE NEGATIVE 01/17/2018 2141   LEUKOCYTESUR NEGATIVE 01/17/2018 2141    Radiological Exams on Admission: Ct Angio Head W Or Wo Contrast  Result Date: 01/17/2018 CLINICAL DATA:  Dizziness after eating a pepper today. History of thoracic aortic aneurysm, hypertension and lung cancer. EXAM: CT ANGIOGRAPHY HEAD AND NECK TECHNIQUE: Multidetector CT imaging of the head and neck was performed using the standard protocol during bolus administration of intravenous contrast. Multiplanar CT image reconstructions and MIPs were obtained to evaluate the vascular anatomy. Carotid stenosis measurements (when applicable) are obtained utilizing NASCET criteria, using the distal internal carotid diameter as the denominator. CONTRAST:  44mL ISOVUE-370 IOPAMIDOL (ISOVUE-370) INJECTION 76% COMPARISON:  CT HEAD January 17, 2018 and CT angiogram chest January 03, 2018. FINDINGS: CTA NECK FINDINGS: AORTIC ARCH: Ascending aortic aneurysm, incompletely imaged. Moderate calcific atherosclerosis aortic arch. The origins of the innominate, left Common carotid artery and subclavian artery are patent. RIGHT CAROTID SYSTEM: Common carotid artery is patent. Mild calcific atherosclerosis of the carotid bifurcation without hemodynamically significant stenosis by NASCET criteria. Normal appearance of the internal carotid artery. LEFT CAROTID SYSTEM: Common carotid artery is patent. Calcific atherosclerosis resulting in subcentimeter segment of 50% stenosis LEFT ICA within 1.5 cm of the origin. Patent internal carotid artery. VERTEBRAL ARTERIES:RIGHT vertebral artery is dominant. Ectatic RIGHT V1 segment associated with hypertension. Patent vertebral arteries with mild extrinsic  compression due to degenerative cervical spine. SKELETON: No acute osseous process though bone windows have not been submitted. Advanced cervical spondylosis. OTHER NECK: Soft tissues of the neck are nonacute though, not tailored for evaluation. UPPER CHEST: Centrilobular emphysema. No superior mediastinal lymphadenopathy. CTA HEAD FINDINGS: ANTERIOR CIRCULATION: Patent cervical internal carotid arteries, petrous, cavernous and supra clinoid internal carotid arteries. Severe stenosis RIGHT paraclinoid ICA. Moderate stenosis LEFT paraclinoid ICA. Patent anterior communicating artery. Patent anterior and middle cerebral arteries, mild luminal irregularity compatible with atherosclerosis. No large vessel occlusion, contrast extravasation or aneurysm. POSTERIOR CIRCULATION: Patent vertebral arteries, vertebrobasilar junction and basilar artery, as well as main branch vessels. Patent posterior cerebral  arteries, mild luminal irregularity compatible with atherosclerosis. Small RIGHT posterior communicating artery present. Severe stenosis distal LEFT P2 segment. No large vessel occlusion, contrast extravasation or aneurysm. VENOUS SINUSES: Major dural venous sinuses are patent though not tailored for evaluation on this angiographic examination. ANATOMIC VARIANTS: None. DELAYED PHASE: No abnormal intracranial enhancement. MIP images reviewed. IMPRESSION: CTA NECK: 1. 50% stenosis LEFT ICA. No hemodynamically significant stenosis RIGHT ICA. 2. Patent vertebral arteries, nonacute. CTA HEAD: 1. No emergent large vessel occlusion. 2. Severe stenosis RIGHT paraclinoid ICA. Severe stenosis LEFT P2 segment. 3. Mild intracranial atherosclerosis. Emphysema (ICD10-J43.9).  Aortic Atherosclerosis (ICD10-I70.0). Electronically Signed   By: Elon Alas M.D.   On: 01/17/2018 22:53   Ct Angio Neck W Or Wo Contrast  Result Date: 01/17/2018 CLINICAL DATA:  Dizziness after eating a pepper today. History of thoracic aortic aneurysm,  hypertension and lung cancer. EXAM: CT ANGIOGRAPHY HEAD AND NECK TECHNIQUE: Multidetector CT imaging of the head and neck was performed using the standard protocol during bolus administration of intravenous contrast. Multiplanar CT image reconstructions and MIPs were obtained to evaluate the vascular anatomy. Carotid stenosis measurements (when applicable) are obtained utilizing NASCET criteria, using the distal internal carotid diameter as the denominator. CONTRAST:  86mL ISOVUE-370 IOPAMIDOL (ISOVUE-370) INJECTION 76% COMPARISON:  CT HEAD January 17, 2018 and CT angiogram chest January 03, 2018. FINDINGS: CTA NECK FINDINGS: AORTIC ARCH: Ascending aortic aneurysm, incompletely imaged. Moderate calcific atherosclerosis aortic arch. The origins of the innominate, left Common carotid artery and subclavian artery are patent. RIGHT CAROTID SYSTEM: Common carotid artery is patent. Mild calcific atherosclerosis of the carotid bifurcation without hemodynamically significant stenosis by NASCET criteria. Normal appearance of the internal carotid artery. LEFT CAROTID SYSTEM: Common carotid artery is patent. Calcific atherosclerosis resulting in subcentimeter segment of 50% stenosis LEFT ICA within 1.5 cm of the origin. Patent internal carotid artery. VERTEBRAL ARTERIES:RIGHT vertebral artery is dominant. Ectatic RIGHT V1 segment associated with hypertension. Patent vertebral arteries with mild extrinsic compression due to degenerative cervical spine. SKELETON: No acute osseous process though bone windows have not been submitted. Advanced cervical spondylosis. OTHER NECK: Soft tissues of the neck are nonacute though, not tailored for evaluation. UPPER CHEST: Centrilobular emphysema. No superior mediastinal lymphadenopathy. CTA HEAD FINDINGS: ANTERIOR CIRCULATION: Patent cervical internal carotid arteries, petrous, cavernous and supra clinoid internal carotid arteries. Severe stenosis RIGHT paraclinoid ICA. Moderate stenosis  LEFT paraclinoid ICA. Patent anterior communicating artery. Patent anterior and middle cerebral arteries, mild luminal irregularity compatible with atherosclerosis. No large vessel occlusion, contrast extravasation or aneurysm. POSTERIOR CIRCULATION: Patent vertebral arteries, vertebrobasilar junction and basilar artery, as well as main branch vessels. Patent posterior cerebral arteries, mild luminal irregularity compatible with atherosclerosis. Small RIGHT posterior communicating artery present. Severe stenosis distal LEFT P2 segment. No large vessel occlusion, contrast extravasation or aneurysm. VENOUS SINUSES: Major dural venous sinuses are patent though not tailored for evaluation on this angiographic examination. ANATOMIC VARIANTS: None. DELAYED PHASE: No abnormal intracranial enhancement. MIP images reviewed. IMPRESSION: CTA NECK: 1. 50% stenosis LEFT ICA. No hemodynamically significant stenosis RIGHT ICA. 2. Patent vertebral arteries, nonacute. CTA HEAD: 1. No emergent large vessel occlusion. 2. Severe stenosis RIGHT paraclinoid ICA. Severe stenosis LEFT P2 segment. 3. Mild intracranial atherosclerosis. Emphysema (ICD10-J43.9).  Aortic Atherosclerosis (ICD10-I70.0). Electronically Signed   By: Elon Alas M.D.   On: 01/17/2018 22:53   Ct Head Code Stroke Wo Contrast  Result Date: 01/17/2018 CLINICAL DATA:  Code stroke. 84 y/o M; severe ataxia and dizziness. History of thoracic aortic dissection.  EXAM: CT HEAD WITHOUT CONTRAST TECHNIQUE: Contiguous axial images were obtained from the base of the skull through the vertex without intravenous contrast. COMPARISON:  09/07/2010 CT head. FINDINGS: Brain: No evidence of acute infarction, hemorrhage, hydrocephalus, extra-axial collection or mass lesion/mass effect. Mild progression of chronic microvascular ischemic changes and volume loss of the brain. Vascular: Calcific atherosclerosis of the carotid siphons. No hyperdense vessel identified. Skull: Normal.  Negative for fracture or focal lesion. Sinuses/Orbits: No acute finding. Other: Bilateral intra-ocular lens replacement. ASPECTS [facility] Stroke Program Early CT Score) - Ganglionic level infarction (caudate, lentiform nuclei, internal capsule, insula, M1-M3 cortex): 7 - Supraganglionic infarction (M4-M6 cortex): 3 Total score (0-10 with 10 being normal): 10 IMPRESSION: 1. No acute intracranial abnormality identified. 2. ASPECTS is 10 3. Mild progression of chronic microvascular ischemic changes and volume loss of the brain. These results were called by telephone at the time of interpretation on 01/17/2018 at 10:05 pm to Dr. Noemi Chapel , who verbally acknowledged these results. Electronically Signed   By: Kristine Garbe M.D.   On: 01/17/2018 22:08    EKG: Independently reviewed.  Vent. rate 86 BPM PR interval * ms QRS duration 145 ms QT/QTc 404/484 ms P-R-T axes 73 -14 58 Sinus rhythm Right bundle branch block Baseline wander in lead(s) V6 since last tracing no significant change  Assessment/Plan Principal Problem:   TIA (transient ischemic attack) Observation/telemetry. Frequent neuro checks. PT/OT/SLP. Continue aspirin. Check fasting lipids and hemoglobin A1c. Check echocardiogram. Check MR MRA to brain. Consult neurology if MRI shows infarct.  Active Problems:   Essential hypertension Allow permissive hypertension. Monitor blood pressure.    COPD (chronic obstructive pulmonary disease) (HCC) Asymptomatic at this time. Supplemental oxygen and rescue bronchodilators as needed.    Type 2 diabetes mellitus (HCC) Diet control. Continue carbohydrate modified diet. Check hemoglobin A1c.    CAD (coronary artery disease) Continue aspirin.    Bilateral carotid artery stenosis Continue aspirin.    Aortic root dilatation (HCC) Continue interval imaging surveillance per CTS/PCP.    BPH (benign prostatic hyperplasia) Continue Flomax 0.4 mg p.o. every evening.     GERD (gastroesophageal reflux disease) Continue Protonix.    DVT prophylaxis: Lovenox SQ. Code Status: Full code. Family Communication: Disposition Plan: Observation for TIA/CVA work-up. Consults called: Admission status: Observation/telemetry.   Reubin Milan MD [facility] Pager [phone].  If 7PM-7AM, please contact night-coverage [email or web address] Password [email or web address]  01/17/2018, 11:50 PM

## 2018-01-17 NOTE — Consult Note (Addendum)
[facility] Consult Services   Date of Service:   01/17/2018 22:02:27  Impression:     .  Transient Ischemic Attack  Comments: Differential Diagnosis: 1. Cardioembolic stroke 2. Small vessel disease/lacune 3. Thromboembolic, artery-to-artery mechanism 4. Hypercoagulable state-related infarct 5. Transient ischemic attack 6. Thrombotic mechanism, large artery disease if there was a speech issue it resolved.  Mechanism of Stroke: Not Clear  Metrics: Last Known Well: 01/17/2018 18:30:00 [facility] Notification Time: 01/17/2018 22:01:32 Arrival Time: 01/17/2018 20:42:00 Stamp Time: 01/17/2018 22:02:27 Time First Login Attempt: 01/17/2018 22:06:00 Video Start Time: 01/17/2018 22:06:00  Symptoms: stumbling, trouble finding words NIHSS Start Assessment Time: 01/17/2018 22:25:57 Patient is not a candidate for tPA. Patient was not deemed candidate for tPA thrombolytics because of NIH = 0, no focal neurologic deficit. Video End Time: 01/17/2018 22:35:50  CT head showed no acute hemorrhage or acute core infarct. CT head was reviewed and results were: extensive chronic white matter ischemic changes  Advanced imaging is to be reviewed by ED physician and NIR. Advanced imaging CTA head and neck obtained.   Radiologist was not called back for review of advanced imaging because Image is not available at the time of this dictation; will amend note as necessary. ER Physician notified of the decision on thrombolytics management on 01/17/2018 22:34:00  Our recommendations are outlined below.  Recommendations:     .  Activate Stroke Protocol Admission/Order Set     .  Stroke/Telemetry Floor     .  Neuro Checks     .  Bedside Swallow Eval     .  DVT Prophylaxis     .  IV Fluids, Normal Saline     .  Head of Bed Below 30 Degrees     .  Euglycemia and Avoid Hyperthermia (PRN Acetaminophen)     .  Antiplatelet Therapy Recommended  Routine Consultation  with [facility] for Follow up Care  Sign Out:     .  Discussed with Emergency Department Provider    ------------------------------------------------------------------------------  History of Present Illness: Patient is a 84 year old Male.  Patient was brought by private transportation with symptoms of stumbling, trouble finding words  According to the neighbor who accompanied him, he ate dinner and took a nap at approximately 1830 and then called his friend because he was stumbling and had trouble finding words. He lives alone. He has a history of hypertension. He is diabetic. There is no history of coronary disease or atrial fibrillation but he has had a thoracic aortic dissection and history of lung cancer status unknown. He has hyperlipidemia. He quit smoking decades ago. He takes [place] but no anticoagulation.  CT head showed no acute hemorrhage or acute core infarct. CT head was reviewed.  Last seen normal was within 4.5 hours. There is no history of hemorrhagic complications or intracranial hemorrhage. There is no history of Recent Anticoagulants. There is no history of recent major surgery. There is no history of recent stroke.  Examination: BP(144/66), Pulse(86), Blood Glucose(None drawn) 1A: Level of Consciousness - Alert; keenly responsive + 0 1B: Ask Month and Age - Both Questions Right + 0 1C: Blink Eyes & Squeeze Hands - Performs Both Tasks + 0 2: Test Horizontal Extraocular Movements - Normal + 0 3: Test Visual Fields - No Visual Loss + 0 4: Test Facial Palsy (Use Grimace if Obtunded) - Normal symmetry + 0 5A: Test Left Arm Motor Drift - No Drift for 10 Seconds + 0 5B: Test Right  Arm Motor Drift - No Drift for 10 Seconds + 0 6A: Test Left Leg Motor Drift - No Drift for 5 Seconds + 0 6B: Test Right Leg Motor Drift - No Drift for 5 Seconds + 0 7: Test Limb Ataxia (FNF/Heel-Shin) - No Ataxia + 0 8: Test Sensation - Normal; No sensory loss + 0 9: Test  Language/Aphasia - Normal; No aphasia + 0 10: Test Dysarthria - Normal + 0 11: Test Extinction/Inattention - No abnormality + 0  NIHSS Score: 0  Patient was informed the Neurology Consult would happen via TeleHealth consult by way of interactive audio and video telecommunications and consented to receiving care in this manner.  Due to the immediate potential for life-threatening deterioration due to underlying acute neurologic illness, I spent 35 minutes providing critical care. This time includes time for face to face visit via telemedicine, review of medical records, imaging studies and discussion of findings with providers, the patient and/or family.   Dr Cindie Laroche  Addendum: I reviewed the CTA of the head and neck. There of large vessel occlusionor aneurysm. There was 50% stenosis of the internal carotid on the left but not on the right. There was severe stenosis of the right paraclinoid internal carotid and the left P2 segment. The patient is not a candidate for thrombectomy.  Consideration should be given to elective consultation with vascular surgery for possible right carotid stent if technically feasible.   [facility] [phone]  Case [id number]

## 2018-01-17 NOTE — Progress Notes (Signed)
CODE STROKE 2144 CALL TIME 2145 BEEPER TIME 2152 EXAM STARTED 2156 EXAM COMPLETE 2156 IMAGES SENT TO SOC 2159 EXAM COMPLETED IN EPIC 2159 [facility] CALLED

## 2018-01-17 NOTE — ED Provider Notes (Signed)
[facility] EMERGENCY DEPARTMENT Provider Note   CSN: [id number] Arrival date & time: 01/17/18  2042     History   Chief Complaint Chief Complaint  Patient presents with  . Dizziness    HPI Brian Austin is a 84 y.o. male.  HPI  84 year old male, he has a known history of COPD, coronary disease, CT scan showing a 5 cm a sending aortic aneurysm of the thoracic aorta, history of hypertension and a history of spontaneous pneumothorax.  He presents from home with acute onset of dizziness which occurred approximately 2 hours ago after eating dinner.  He reports that the dizziness is not vertigo, it is not syncope, it is a feeling of not having balance.  He has been unable to walk because of severe difficulty with balance.  He denies visual symptoms, denies facial droop, difficulty with speech or numbness or weakness of the arms or the legs.  This is been persistent, he comes in accompanied by his neighbor who drove him.  He denies chest pain shortness of breath fevers chills nausea vomiting diarrhea swelling of the legs.  Past Medical History:  Diagnosis Date  . Arthritis   . BPH (benign prostatic hyperplasia)   . CAD (coronary artery disease)    s/p Xience stent ot the distal RAC 2010, PCI with Xience stent to distal RCA at the [facility] 2011  . COPD (chronic obstructive pulmonary disease) ([place]) 05/22/2008   FEV1/FVS 44.9  . Diabetes mellitus    Well controlled  . GERD (gastroesophageal reflux disease)   . Hyperlipidemia   . Hypertension   . Spontaneous pneumothorax   . Squamous cell carcinoma lung ([place]) 2000   CT w/ CM; 04/25/2009 COPD, s/p RLL, CAD, 5 cm ascending aortic aneurysm  . Thoracic ascending aortic aneurysm ([facility])    4.8 cm   . Type II or unspecified type diabetes mellitus without mention of complication, not stated as uncontrolled     Patient Active Problem List   Diagnosis Date Noted  . TIA (transient ischemic attack) 01/17/2018  . BPH (benign prostatic  hyperplasia) 01/17/2018  . GERD (gastroesophageal reflux disease) 01/17/2018  . Dyslipidemia 10/31/2017  . Aortic root dilatation ([place]) 10/31/2017  . Dysphagia 06/23/2017  . Odynophagia 06/23/2017  . Loss of weight 06/23/2017  . Bilateral carotid artery stenosis 10/31/2016  . Nontraumatic incomplete tear of right rotator cuff 01/22/2013  . Pain in joint, shoulder region 01/22/2013  . Dog bite of hand 11/10/2012  . Cellulitis and abscess of hand 11/10/2012  . Hyperlipidemia   . Type 2 diabetes mellitus ([place])   . Jaw pain   . CAD (coronary artery disease)   . Thoracic aortic aneurysm (TAA) ([place]) 04/28/2009  . HEMOPTYSIS UNSPECIFIED 04/25/2009  . CHEST PAIN, ATYPICAL 04/25/2009  . LUNG CANCER, HX OF 04/25/2009  . Chronic ischemic heart disease 07/02/2008  . MITRAL REGURGITATION 06/28/2008  . Essential hypertension 06/28/2008  . COPD (chronic obstructive pulmonary disease) ([place]) 06/28/2008  . CARDIOVASCULAR FUNCTION STUDY, ABNORMAL 06/19/2008  . AODM 06/13/2008  . Hyperlipemia 06/13/2008  . JAW PAIN 06/13/2008  . PRECORDIAL PAIN 06/13/2008    Past Surgical History:  Procedure Laterality Date  . BACK SURGERY    . CARDIAC CATHETERIZATION  2010   Stent  . ESOPHAGOGASTRODUODENOSCOPY N/A 09/30/2017   Procedure: ESOPHAGOGASTRODUODENOSCOPY (EGD);  Surgeon: Daneil Dolin, MD;  [facility];  Service: Endoscopy;  Laterality: N/A;  2:15PM  . I&D EXTREMITY Left 11/10/2012   Procedure: IRRIGATION AND DEBRIDEMENT Left third-long  finger ;  Surgeon: Jolyn Nap, MD;  Location: [place];  Service: Orthopedics;  Laterality: Left;  . LOBECTOMY  2000   Right lower for squamous cell ca  . LUMBAR LAMINECTOMY  1978  . Lymph node resected  1983   Axilla  . MALONEY DILATION N/A 09/30/2017   Procedure: Venia Minks DILATION;  Surgeon: Daneil Dolin, MD;  Location: [facility];  Service: Endoscopy;  Laterality: N/A;  . Needle removed from the right foot    . Spontaneous pneumothrax  1979         Home Medications    Prior to Admission medications   Medication Sig Start Date End Date Taking? Authorizing Provider  aspirin EC 81 MG tablet Take 81 mg by mouth daily.    [provider]  Hypromellose (ARTIFICIAL TEARS OP) Place 1 drop into both eyes 2 (two) times daily as needed (for dry eyes).    [provider]  losartan (COZAAR) 25 MG tablet Take 25 mg by mouth daily as needed (for BP > 130).  04/14/17   [provider]  magnesium oxide (MAG-OX) 400 MG tablet Take 400 mg by mouth daily.    [provider]  nitroGLYCERIN (NITROSTAT) 0.4 MG SL tablet Place 1 tablet (0.4 mg total) under the tongue every 5 (five) minutes as needed. 10/31/17   Minus Breeding, MD  Omega-3 Fatty Acids (FISH OIL) 1200 MG CAPS Take 1,200 mg by mouth daily.     [provider]  pantoprazole (PROTONIX) 40 MG tablet Take 40 mg by mouth daily. Take 1 tablet daily 10/03/17   [provider]  Tamsulosin HCl (FLOMAX) 0.4 MG CAPS Take 0.4 mg by mouth daily after supper.     [provider]    Family History Family History  Problem Relation Age of Onset  . Diabetes Father   . Alcohol abuse Father   . Heart disease Neg Hx   . Colon cancer Neg Hx   . Gastric cancer Neg Hx   . Esophageal cancer Neg Hx     Social History Social History   Tobacco Use  . Smoking status: Former Smoker    Last attempt to quit: 02/16/1984    Years since quitting: 33.9  . Smokeless tobacco: Never Used  . Tobacco comment: Smoked for 30 years  Substance Use Topics  . Alcohol use: Yes    Comment: wine or beer with supper  . Drug use: No     Allergies   Patient has no known allergies.   Review of Systems Review of Systems  All other systems reviewed and are negative.    Physical Exam Updated Vital Signs BP 135/75   Pulse 80   Temp (!) 97.2 F (36.2 C)   Resp 12   Ht 1.715 m (5' 7.5")   Wt 65.7 kg   SpO2 97%   BMI 22.35 kg/m   Physical Exam    Constitutional: He appears well-developed and well-nourished. No distress.  HENT:  Head: Normocephalic and atraumatic.  Mouth/Throat: Oropharynx is clear and moist. No oropharyngeal exudate.  Eyes: Pupils are equal, round, and reactive to light. Conjunctivae and EOM are normal. Right eye exhibits no discharge. Left eye exhibits no discharge. No scleral icterus.  Neck: Normal range of motion. Neck supple. No JVD present. No thyromegaly present.  Cardiovascular: Normal rate, regular rhythm, normal heart sounds and intact distal pulses. Exam reveals no gallop and no friction rub.  No murmur heard. Pulmonary/Chest: Effort normal and  breath sounds normal. No respiratory distress. He has no wheezes. He has no rales.  Abdominal: Soft. Bowel sounds are normal. He exhibits no distension and no mass. There is no tenderness.  Musculoskeletal: Normal range of motion. He exhibits no edema or tenderness.  Lymphadenopathy:    He has no cervical adenopathy.  Neurological: He is alert. Coordination abnormal.  Speech is clear, finger-nose-finger is normal, heel shin is normal, the patient has 5 out of 5 strength in all 4 extremities, no pronator drift, no facial droop, when I get him up to walk he has poor balance and is having difficulty walking without short wide-based gait.  Skin: Skin is warm and dry. No rash noted. No erythema.  Psychiatric: He has a normal mood and affect. His behavior is normal.  Nursing note and vitals reviewed.    ED Treatments / Results  Labs (all labs ordered are listed, but only abnormal results are displayed) Labs Reviewed  COMPREHENSIVE METABOLIC PANEL - Abnormal; Notable for the following components:      Result Value   Glucose, Bld 110 (*)    All other components within normal limits  URINALYSIS, ROUTINE W REFLEX MICROSCOPIC - Abnormal; Notable for the following components:   Specific Gravity, Urine 1.031 (*)    All other components within normal limits  I-STAT CHEM  8, ED - Abnormal; Notable for the following components:   Glucose, Bld 121 (*)    All other components within normal limits  ETHANOL  PROTIME-INR  APTT  CBC  DIFFERENTIAL  RAPID URINE DRUG SCREEN, HOSP PERFORMED  PHOSPHORUS  MAGNESIUM  HEMOGLOBIN A1C  LIPID PANEL  I-STAT TROPONIN, ED    EKG EKG Interpretation  Date/Time:  Tuesday January 17 2018 22:35:39 EST Ventricular Rate:  86 PR Interval:    QRS Duration: 145 QT Interval:  404 QTC Calculation: 484 R Axis:   -14 Text Interpretation:  Sinus rhythm Right bundle branch block Baseline wander in lead(s) V6 since last tracing no significant change Confirmed by Noemi Chapel [phone]) on 01/17/2018 11:11:02 PM   Radiology Ct Angio Head W Or Wo Contrast  Result Date: 01/17/2018 CLINICAL DATA:  Dizziness after eating a pepper today. History of thoracic aortic aneurysm, hypertension and lung cancer. EXAM: CT ANGIOGRAPHY HEAD AND NECK TECHNIQUE: Multidetector CT imaging of the head and neck was performed using the standard protocol during bolus administration of intravenous contrast. Multiplanar CT image reconstructions and MIPs were obtained to evaluate the vascular anatomy. Carotid stenosis measurements (when applicable) are obtained utilizing NASCET criteria, using the distal internal carotid diameter as the denominator. CONTRAST:  21mL ISOVUE-370 IOPAMIDOL (ISOVUE-370) INJECTION 76% COMPARISON:  CT HEAD January 17, 2018 and CT angiogram chest January 03, 2018. FINDINGS: CTA NECK FINDINGS: AORTIC ARCH: Ascending aortic aneurysm, incompletely imaged. Moderate calcific atherosclerosis aortic arch. The origins of the innominate, left Common carotid artery and subclavian artery are patent. RIGHT CAROTID SYSTEM: Common carotid artery is patent. Mild calcific atherosclerosis of the carotid bifurcation without hemodynamically significant stenosis by NASCET criteria. Normal appearance of the internal carotid artery. LEFT CAROTID SYSTEM: Common  carotid artery is patent. Calcific atherosclerosis resulting in subcentimeter segment of 50% stenosis LEFT ICA within 1.5 cm of the origin. Patent internal carotid artery. VERTEBRAL ARTERIES:RIGHT vertebral artery is dominant. Ectatic RIGHT V1 segment associated with hypertension. Patent vertebral arteries with mild extrinsic compression due to degenerative cervical spine. SKELETON: No acute osseous process though bone windows have not been submitted. Advanced cervical spondylosis. OTHER NECK: Soft tissues of the  neck are nonacute though, not tailored for evaluation. UPPER CHEST: Centrilobular emphysema. No superior mediastinal lymphadenopathy. CTA HEAD FINDINGS: ANTERIOR CIRCULATION: Patent cervical internal carotid arteries, petrous, cavernous and supra clinoid internal carotid arteries. Severe stenosis RIGHT paraclinoid ICA. Moderate stenosis LEFT paraclinoid ICA. Patent anterior communicating artery. Patent anterior and middle cerebral arteries, mild luminal irregularity compatible with atherosclerosis. No large vessel occlusion, contrast extravasation or aneurysm. POSTERIOR CIRCULATION: Patent vertebral arteries, vertebrobasilar junction and basilar artery, as well as main branch vessels. Patent posterior cerebral arteries, mild luminal irregularity compatible with atherosclerosis. Small RIGHT posterior communicating artery present. Severe stenosis distal LEFT P2 segment. No large vessel occlusion, contrast extravasation or aneurysm. VENOUS SINUSES: Major dural venous sinuses are patent though not tailored for evaluation on this angiographic examination. ANATOMIC VARIANTS: None. DELAYED PHASE: No abnormal intracranial enhancement. MIP images reviewed. IMPRESSION: CTA NECK: 1. 50% stenosis LEFT ICA. No hemodynamically significant stenosis RIGHT ICA. 2. Patent vertebral arteries, nonacute. CTA HEAD: 1. No emergent large vessel occlusion. 2. Severe stenosis RIGHT paraclinoid ICA. Severe stenosis LEFT P2 segment.  3. Mild intracranial atherosclerosis. Emphysema (ICD10-J43.9).  Aortic Atherosclerosis (ICD10-I70.0). Electronically Signed   By: Elon Alas M.D.   On: 01/17/2018 22:53   Ct Angio Neck W Or Wo Contrast  Result Date: 01/17/2018 CLINICAL DATA:  Dizziness after eating a pepper today. History of thoracic aortic aneurysm, hypertension and lung cancer. EXAM: CT ANGIOGRAPHY HEAD AND NECK TECHNIQUE: Multidetector CT imaging of the head and neck was performed using the standard protocol during bolus administration of intravenous contrast. Multiplanar CT image reconstructions and MIPs were obtained to evaluate the vascular anatomy. Carotid stenosis measurements (when applicable) are obtained utilizing NASCET criteria, using the distal internal carotid diameter as the denominator. CONTRAST:  40mL ISOVUE-370 IOPAMIDOL (ISOVUE-370) INJECTION 76% COMPARISON:  CT HEAD January 17, 2018 and CT angiogram chest January 03, 2018. FINDINGS: CTA NECK FINDINGS: AORTIC ARCH: Ascending aortic aneurysm, incompletely imaged. Moderate calcific atherosclerosis aortic arch. The origins of the innominate, left Common carotid artery and subclavian artery are patent. RIGHT CAROTID SYSTEM: Common carotid artery is patent. Mild calcific atherosclerosis of the carotid bifurcation without hemodynamically significant stenosis by NASCET criteria. Normal appearance of the internal carotid artery. LEFT CAROTID SYSTEM: Common carotid artery is patent. Calcific atherosclerosis resulting in subcentimeter segment of 50% stenosis LEFT ICA within 1.5 cm of the origin. Patent internal carotid artery. VERTEBRAL ARTERIES:RIGHT vertebral artery is dominant. Ectatic RIGHT V1 segment associated with hypertension. Patent vertebral arteries with mild extrinsic compression due to degenerative cervical spine. SKELETON: No acute osseous process though bone windows have not been submitted. Advanced cervical spondylosis. OTHER NECK: Soft tissues of the neck are  nonacute though, not tailored for evaluation. UPPER CHEST: Centrilobular emphysema. No superior mediastinal lymphadenopathy. CTA HEAD FINDINGS: ANTERIOR CIRCULATION: Patent cervical internal carotid arteries, petrous, cavernous and supra clinoid internal carotid arteries. Severe stenosis RIGHT paraclinoid ICA. Moderate stenosis LEFT paraclinoid ICA. Patent anterior communicating artery. Patent anterior and middle cerebral arteries, mild luminal irregularity compatible with atherosclerosis. No large vessel occlusion, contrast extravasation or aneurysm. POSTERIOR CIRCULATION: Patent vertebral arteries, vertebrobasilar junction and basilar artery, as well as main branch vessels. Patent posterior cerebral arteries, mild luminal irregularity compatible with atherosclerosis. Small RIGHT posterior communicating artery present. Severe stenosis distal LEFT P2 segment. No large vessel occlusion, contrast extravasation or aneurysm. VENOUS SINUSES: Major dural venous sinuses are patent though not tailored for evaluation on this angiographic examination. ANATOMIC VARIANTS: None. DELAYED PHASE: No abnormal intracranial enhancement. MIP images reviewed. IMPRESSION: CTA NECK: 1.  50% stenosis LEFT ICA. No hemodynamically significant stenosis RIGHT ICA. 2. Patent vertebral arteries, nonacute. CTA HEAD: 1. No emergent large vessel occlusion. 2. Severe stenosis RIGHT paraclinoid ICA. Severe stenosis LEFT P2 segment. 3. Mild intracranial atherosclerosis. Emphysema (ICD10-J43.9).  Aortic Atherosclerosis (ICD10-I70.0). Electronically Signed   By: Elon Alas M.D.   On: 01/17/2018 22:53   Ct Head Code Stroke Wo Contrast  Result Date: 01/17/2018 CLINICAL DATA:  Code stroke. 84 y/o M; severe ataxia and dizziness. History of thoracic aortic dissection. EXAM: CT HEAD WITHOUT CONTRAST TECHNIQUE: Contiguous axial images were obtained from the base of the skull through the vertex without intravenous contrast. COMPARISON:  09/07/2010 CT  head. FINDINGS: Brain: No evidence of acute infarction, hemorrhage, hydrocephalus, extra-axial collection or mass lesion/mass effect. Mild progression of chronic microvascular ischemic changes and volume loss of the brain. Vascular: Calcific atherosclerosis of the carotid siphons. No hyperdense vessel identified. Skull: Normal. Negative for fracture or focal lesion. Sinuses/Orbits: No acute finding. Other: Bilateral intra-ocular lens replacement. ASPECTS [facility] Stroke Program Early CT Score) - Ganglionic level infarction (caudate, lentiform nuclei, internal capsule, insula, M1-M3 cortex): 7 - Supraganglionic infarction (M4-M6 cortex): 3 Total score (0-10 with 10 being normal): 10 IMPRESSION: 1. No acute intracranial abnormality identified. 2. ASPECTS is 10 3. Mild progression of chronic microvascular ischemic changes and volume loss of the brain. These results were called by telephone at the time of interpretation on 01/17/2018 at 10:05 pm to Dr. Noemi Chapel , who verbally acknowledged these results. Electronically Signed   By: Kristine Garbe M.D.   On: 01/17/2018 22:08    Procedures Procedures (including critical care time)  Medications Ordered in ED Medications   stroke: mapping our early stages of recovery book (has no administration in time range)  acetaminophen (TYLENOL) tablet 650 mg (has no administration in time range)    Or  acetaminophen (TYLENOL) solution 650 mg (has no administration in time range)    Or  acetaminophen (TYLENOL) suppository 650 mg (has no administration in time range)  enoxaparin (LOVENOX) injection 40 mg (has no administration in time range)  aspirin suppository 300 mg (has no administration in time range)    Or  aspirin tablet 325 mg (has no administration in time range)  0.9 %  sodium chloride infusion (has no administration in time range)  iopamidol (ISOVUE-370) 76 % injection 75 mL (75 mLs Intravenous Contrast Given 01/17/18 2206)     Initial  Impression / Assessment and Plan / ED Course  I have reviewed the triage vital signs and the nursing notes.  Pertinent labs & imaging results that were available during my care of the patient were reviewed by me and considered in my medical decision making (see chart for details).  Clinical Course as of Jan 17 2350  Tue Jan 17, 2018  2311 Patient has an NIH score of 0, he feels better but is still having a slight dizziness, CT scan and CT angiogram of the head and neck show no obvious large vessel occlusion or acute ischemic stroke.  Recommendations from tele-neurologist is to admit for stroke work-up.   [BM]    Clinical Course User Index [BM] Noemi Chapel, MD    The patient's EKG is unremarkable other than a mild sinus tachycardia, his vital signs are remarkable for only mild hypertension, will check labs and a CT scan, code stroke was activated in case this is a posterior circulation stroke.  We will engage the tele-neurologist immediately.  Initially the patient was complaining of  dry mouth and a vague sense of dizziness and thus a code stroke was not immediately initiated while he was in triage, soon as I saw the patient in the room when he arrived, I initiated this.  I discussed the care with Dr. Olevia Bowens who is willing to admit the patient to the hospital.  I appreciate his willingness to help  Final Clinical Impressions(s) / ED Diagnoses   Final diagnoses:  Dizziness  TIA (transient ischemic attack)      Noemi Chapel, MD 01/17/18 2351

## 2018-01-17 NOTE — ED Notes (Addendum)
Real time initial NIHSS at 2143

## 2018-01-17 NOTE — ED Notes (Signed)
[place] paged @ 2146

## 2018-01-18 ENCOUNTER — Observation Stay (HOSPITAL_COMMUNITY): Payer: Medicare Other

## 2018-01-18 ENCOUNTER — Observation Stay (HOSPITAL_BASED_OUTPATIENT_CLINIC_OR_DEPARTMENT_OTHER): Payer: Medicare Other

## 2018-01-18 DIAGNOSIS — G459 Transient cerebral ischemic attack, unspecified: Secondary | ICD-10-CM | POA: Diagnosis not present

## 2018-01-18 DIAGNOSIS — I6523 Occlusion and stenosis of bilateral carotid arteries: Secondary | ICD-10-CM | POA: Diagnosis not present

## 2018-01-18 LAB — LIPID PANEL
CHOLESTEROL: 141 mg/dL (ref 0–200)
HDL: 40 mg/dL — ABNORMAL LOW (ref >40)
LDL Cholesterol: 95 mg/dL (ref 0–99)
Total CHOL/HDL Ratio: 3.5 RATIO
Triglycerides: 31 mg/dL (ref <150)
VLDL: 6 mg/dL (ref 0–40)

## 2018-01-18 LAB — MRSA PCR SCREENING: MRSA by PCR: NEGATIVE

## 2018-01-18 LAB — HEMOGLOBIN A1C
Hgb A1c MFr Bld: 5.5 % (ref 4.8–5.6)
Mean Plasma Glucose: 111.15 mg/dL

## 2018-01-18 LAB — ECHOCARDIOGRAM COMPLETE
HEIGHTINCHES: 67.5 in
Weight: 2321 oz

## 2018-01-18 MED ORDER — POLYVINYL ALCOHOL 1.4 % OP SOLN
1.0000 [drp] | Freq: Two times a day (BID) | OPHTHALMIC | Status: DC | PRN
  Filled 2018-01-18: qty 15

## 2018-01-18 MED ORDER — SIMVASTATIN 10 MG PO TABS: 10.0000 mg | ORAL_TABLET | Freq: Every evening | ORAL | 11 refills | Status: DC

## 2018-01-18 MED ORDER — NITROGLYCERIN 0.4 MG SL SUBL: 0.4000 mg | SUBLINGUAL_TABLET | SUBLINGUAL | Status: DC | PRN

## 2018-01-18 MED ORDER — MAGNESIUM OXIDE 400 (241.3 MG) MG PO TABS
400.0000 mg | ORAL_TABLET | Freq: Every day | ORAL | Status: DC
  Administered 2018-01-18: 400 mg via ORAL
  Filled 2018-01-18: qty 1

## 2018-01-18 MED ORDER — PANTOPRAZOLE SODIUM 40 MG PO TBEC
40.0000 mg | DELAYED_RELEASE_TABLET | Freq: Every day | ORAL | Status: DC
  Administered 2018-01-18: 40 mg via ORAL
  Filled 2018-01-18: qty 1

## 2018-01-18 MED ORDER — TAMSULOSIN HCL 0.4 MG PO CAPS: 0.4000 mg | ORAL_CAPSULE | Freq: Every day | ORAL | Status: DC

## 2018-01-18 NOTE — Care Management Obs Status (Signed)
[facility] NOTIFICATION   Patient Details  Name: Brian Austin MRN: [id number] Date of Birth: December 10, 1930   Medicare Observation Status Notification Given:  Yes    Shelda Altes 01/18/2018, 12:46 PM

## 2018-01-18 NOTE — Discharge Summary (Signed)
Physician Discharge Summary  Brian Austin WER:[id number] DOB: 1930-11-07 DOA: 01/17/2018  PCP: Barry Dienes, NP  Admit date: 01/17/2018  Discharge date: 01/18/2018  Admitted From:Home  Disposition:  Home  Recommendations for Outpatient Follow-up:  1. Follow up with PCP in 1-2 weeks 2. F/U with CT surgeon Dr. Roxan Hockey regarding carotid stenosis in 1-2 weeks by scheduling appointment at his office  Home Health:None  Equipment/Devices:None  Discharge Condition:Stable  CODE STATUS: Full  Diet recommendation: Heart Healthy/Carb Modified  Brief/Interim Summary: Per HPI from Dr. Olevia Bowens: Brian Austin is a 84 y.o. male with medical history significant of osteoarthritis, BPH, history of CAD, stent placement x2, type 2 diabetes, GERD, hyperlipidemia, hypertension, history of squamous cell lung carcinoma, history of spontaneous pneumothorax, history of thoracic ascending aortic aneurysm who is coming to the emergency department due to unsteady gait, grogginess and dry mouth as started earlier in the evening.  He has some trouble finding words, but this has since then resolved.  No headache, vision changes, chest pain, palpitations, diaphoresis, dyspnea, PND, orthopnea or pitting edema of the lower extremities.  He has frequent rhinorrhea, which she attributes to allergies.  He denies sore throat, wheezing, hemoptysis, abdominal pain, nausea, emesis, melena or hematochezia.  He has occasional diarrhea and relatively frequent constipation.  Positive nocturia, about 3 times at night, but denies dysuria or hematuria.  Denies polyuria, polydipsia or polyphagia.  Patient was admitted for complaints of dizziness and dry mouth suspected likely due to a TIA.  He underwent full work-up with no findings on head CT or brain MRI aside from severe right carotid stenosis at the paraclinoid process as well as some stenosis to the left carotid.  His 2D echocardiogram does not demonstrate any acute  abnormalities.  He has ambulated with physical therapy and Occupational Therapy with no further dizziness or symptomatology at this time.  He has no speech or swallow deficits noted at this time.  He is recommended to remain on aspirin 81 mg as well as statin on discharge due to LDL of 95.  No other acute events have been noted during the course of this brief admission.  He is encouraged to follow-up with his CT surgeon in the next 2 weeks regarding his severe carotid stenosis which may very likely be contributing to his symptoms.  Discharge Diagnoses:  Principal Problem:   TIA (transient ischemic attack) Active Problems:   Essential hypertension   COPD (chronic obstructive pulmonary disease) (HCC)   Type 2 diabetes mellitus (HCC)   CAD (coronary artery disease)   Bilateral carotid artery stenosis   Aortic root dilatation (HCC)   BPH (benign prostatic hyperplasia)   GERD (gastroesophageal reflux disease)    Discharge Instructions  Discharge Instructions    Diet - low sodium heart healthy   Complete by:  As directed    Increase activity slowly   Complete by:  As directed      Allergies as of 01/18/2018   No Known Allergies     Medication List    TAKE these medications   ARTIFICIAL TEARS OP Place 1 drop into both eyes 2 (two) times daily as needed (for dry eyes).   aspirin EC 81 MG tablet Take 81 mg by mouth daily.   Fish Oil 1200 MG Caps Take 1,200 mg by mouth daily.   FLOMAX 0.4 MG Caps capsule Generic drug:  tamsulosin Take 0.4 mg by mouth daily after supper.   losartan 25 MG tablet Commonly known as:  COZAAR Take 25  mg by mouth daily as needed (for BP > 130).   magnesium oxide 400 MG tablet Commonly known as:  MAG-OX Take 400 mg by mouth daily.   nitroGLYCERIN 0.4 MG SL tablet Commonly known as:  NITROSTAT Place 1 tablet (0.4 mg total) under the tongue every 5 (five) minutes as needed.   pantoprazole 40 MG tablet Commonly known as:  PROTONIX Take 40 mg by  mouth daily. Take 1 tablet daily   simvastatin 10 MG tablet Commonly known as:  ZOCOR Take 1 tablet (10 mg total) by mouth every evening.      Follow-up Information    Barry Dienes, NP Follow up in 1 week(s).   Specialty:  Nurse Practitioner Contact information: [place] [address] [phone]        Melrose Nakayama, MD. Schedule an appointment as soon as possible for a visit in 2 week(s).   Specialty:  Cardiothoracic Surgery Contact information: [place] [address] [phone]          No Known Allergies  Consultations:  None   Procedures/Studies: Ct Angio Head W Or Wo Contrast  Result Date: 01/17/2018 CLINICAL DATA:  Dizziness after eating a pepper today. History of thoracic aortic aneurysm, hypertension and lung cancer. EXAM: CT ANGIOGRAPHY HEAD AND NECK TECHNIQUE: Multidetector CT imaging of the head and neck was performed using the standard protocol during bolus administration of intravenous contrast. Multiplanar CT image reconstructions and MIPs were obtained to evaluate the vascular anatomy. Carotid stenosis measurements (when applicable) are obtained utilizing NASCET criteria, using the distal internal carotid diameter as the denominator. CONTRAST:  29mL ISOVUE-370 IOPAMIDOL (ISOVUE-370) INJECTION 76% COMPARISON:  CT HEAD January 17, 2018 and CT angiogram chest January 03, 2018. FINDINGS: CTA NECK FINDINGS: AORTIC ARCH: Ascending aortic aneurysm, incompletely imaged. Moderate calcific atherosclerosis aortic arch. The origins of the innominate, left Common carotid artery and subclavian artery are patent. RIGHT CAROTID SYSTEM: Common carotid artery is patent. Mild calcific atherosclerosis of the carotid bifurcation without hemodynamically significant stenosis by NASCET criteria. Normal appearance of the internal carotid artery. LEFT CAROTID SYSTEM: Common carotid artery is patent. Calcific atherosclerosis resulting in  subcentimeter segment of 50% stenosis LEFT ICA within 1.5 cm of the origin. Patent internal carotid artery. VERTEBRAL ARTERIES:RIGHT vertebral artery is dominant. Ectatic RIGHT V1 segment associated with hypertension. Patent vertebral arteries with mild extrinsic compression due to degenerative cervical spine. SKELETON: No acute osseous process though bone windows have not been submitted. Advanced cervical spondylosis. OTHER NECK: Soft tissues of the neck are nonacute though, not tailored for evaluation. UPPER CHEST: Centrilobular emphysema. No superior mediastinal lymphadenopathy. CTA HEAD FINDINGS: ANTERIOR CIRCULATION: Patent cervical internal carotid arteries, petrous, cavernous and supra clinoid internal carotid arteries. Severe stenosis RIGHT paraclinoid ICA. Moderate stenosis LEFT paraclinoid ICA. Patent anterior communicating artery. Patent anterior and middle cerebral arteries, mild luminal irregularity compatible with atherosclerosis. No large vessel occlusion, contrast extravasation or aneurysm. POSTERIOR CIRCULATION: Patent vertebral arteries, vertebrobasilar junction and basilar artery, as well as main branch vessels. Patent posterior cerebral arteries, mild luminal irregularity compatible with atherosclerosis. Small RIGHT posterior communicating artery present. Severe stenosis distal LEFT P2 segment. No large vessel occlusion, contrast extravasation or aneurysm. VENOUS SINUSES: Major dural venous sinuses are patent though not tailored for evaluation on this angiographic examination. ANATOMIC VARIANTS: None. DELAYED PHASE: No abnormal intracranial enhancement. MIP images reviewed. IMPRESSION: CTA NECK: 1. 50% stenosis LEFT ICA. No hemodynamically significant stenosis RIGHT ICA. 2. Patent vertebral arteries, nonacute. CTA  HEAD: 1. No emergent large vessel occlusion. 2. Severe stenosis RIGHT paraclinoid ICA. Severe stenosis LEFT P2 segment. 3. Mild intracranial atherosclerosis. Emphysema (ICD10-J43.9).   Aortic Atherosclerosis (ICD10-I70.0). Electronically Signed   By: Elon Alas M.D.   On: 01/17/2018 22:53   Dg Chest 2 View  Result Date: 01/18/2018 CLINICAL DATA:  Stroke. History of thoracic aortic aneurysm, lung cancer and COPD. EXAM: CHEST - 2 VIEW COMPARISON:  CT chest January 03, 2018 FINDINGS: Cardiomediastinal silhouette is normal. Calcified aortic arch. Surgical clips project RIGHT hilum. Mild RIGHT lung volume loss. No pleural effusions or focal consolidations. Trachea projects midline and there is no pneumothorax. Soft tissue planes and included osseous structures are non-suspicious. IMPRESSION: 1. No acute cardiopulmonary process. 2.  Aortic Atherosclerosis (ICD10-I70.0). Electronically Signed   By: Elon Alas M.D.   On: 01/18/2018 00:13   Ct Angio Neck W Or Wo Contrast  Result Date: 01/17/2018 CLINICAL DATA:  Dizziness after eating a pepper today. History of thoracic aortic aneurysm, hypertension and lung cancer. EXAM: CT ANGIOGRAPHY HEAD AND NECK TECHNIQUE: Multidetector CT imaging of the head and neck was performed using the standard protocol during bolus administration of intravenous contrast. Multiplanar CT image reconstructions and MIPs were obtained to evaluate the vascular anatomy. Carotid stenosis measurements (when applicable) are obtained utilizing NASCET criteria, using the distal internal carotid diameter as the denominator. CONTRAST:  18mL ISOVUE-370 IOPAMIDOL (ISOVUE-370) INJECTION 76% COMPARISON:  CT HEAD January 17, 2018 and CT angiogram chest January 03, 2018. FINDINGS: CTA NECK FINDINGS: AORTIC ARCH: Ascending aortic aneurysm, incompletely imaged. Moderate calcific atherosclerosis aortic arch. The origins of the innominate, left Common carotid artery and subclavian artery are patent. RIGHT CAROTID SYSTEM: Common carotid artery is patent. Mild calcific atherosclerosis of the carotid bifurcation without hemodynamically significant stenosis by NASCET criteria.  Normal appearance of the internal carotid artery. LEFT CAROTID SYSTEM: Common carotid artery is patent. Calcific atherosclerosis resulting in subcentimeter segment of 50% stenosis LEFT ICA within 1.5 cm of the origin. Patent internal carotid artery. VERTEBRAL ARTERIES:RIGHT vertebral artery is dominant. Ectatic RIGHT V1 segment associated with hypertension. Patent vertebral arteries with mild extrinsic compression due to degenerative cervical spine. SKELETON: No acute osseous process though bone windows have not been submitted. Advanced cervical spondylosis. OTHER NECK: Soft tissues of the neck are nonacute though, not tailored for evaluation. UPPER CHEST: Centrilobular emphysema. No superior mediastinal lymphadenopathy. CTA HEAD FINDINGS: ANTERIOR CIRCULATION: Patent cervical internal carotid arteries, petrous, cavernous and supra clinoid internal carotid arteries. Severe stenosis RIGHT paraclinoid ICA. Moderate stenosis LEFT paraclinoid ICA. Patent anterior communicating artery. Patent anterior and middle cerebral arteries, mild luminal irregularity compatible with atherosclerosis. No large vessel occlusion, contrast extravasation or aneurysm. POSTERIOR CIRCULATION: Patent vertebral arteries, vertebrobasilar junction and basilar artery, as well as main branch vessels. Patent posterior cerebral arteries, mild luminal irregularity compatible with atherosclerosis. Small RIGHT posterior communicating artery present. Severe stenosis distal LEFT P2 segment. No large vessel occlusion, contrast extravasation or aneurysm. VENOUS SINUSES: Major dural venous sinuses are patent though not tailored for evaluation on this angiographic examination. ANATOMIC VARIANTS: None. DELAYED PHASE: No abnormal intracranial enhancement. MIP images reviewed. IMPRESSION: CTA NECK: 1. 50% stenosis LEFT ICA. No hemodynamically significant stenosis RIGHT ICA. 2. Patent vertebral arteries, nonacute. CTA HEAD: 1. No emergent large vessel  occlusion. 2. Severe stenosis RIGHT paraclinoid ICA. Severe stenosis LEFT P2 segment. 3. Mild intracranial atherosclerosis. Emphysema (ICD10-J43.9).  Aortic Atherosclerosis (ICD10-I70.0). Electronically Signed   By: Elon Alas M.D.   On: 01/17/2018 22:53   Mr  Brain Wo Contrast  Result Date: 01/18/2018 CLINICAL DATA:  TIA. Unsteady gait with trouble finding words. History of lung cancer. EXAM: MRI HEAD WITHOUT CONTRAST MRA HEAD WITHOUT CONTRAST TECHNIQUE: Multiplanar, multiecho pulse sequences of the brain and surrounding structures were obtained without intravenous contrast. Angiographic images of the head were obtained using MRA technique without contrast. COMPARISON:  CTA head neck from yesterday.  Brain MRI 12/27/2007 FINDINGS: MRI HEAD FINDINGS Brain: No acute infarction, hemorrhage, hydrocephalus, extra-axial collection or mass lesion. Generalized cortical atrophy, fairly mild for age. Minor, age congruent microvascular ischemic change in the periventricular white matter. Vascular: Vascular findings below. Increased T2 signal within the left transverse and sigmoid sinuses. These vessels are patent on CTA yesterday. Skull and upper cervical spine: No evidence of marrow lesion. Notable degenerative facet spurring on the left at C2-3. Sinuses/Orbits: Bilateral cataract resection. MRA HEAD FINDINGS Symmetric carotid arteries. Mild right vertebral artery dominance. No branch occlusion, proximal flow limiting stenosis, or aneurysm. There is generalized atheromatous irregularity of large vessels. Most notable is a moderate right paraclinoid ICA narrowing and a high-grade left P2/3 segment stenosis. IMPRESSION: Brain MRI: Senescent changes without acute finding. Intracranial MRA: 1. No emergent finding or change from CTA yesterday. 2. Intracranial atherosclerosis as described. Electronically Signed   By: Monte Fantasia M.D.   On: 01/18/2018 08:44   Mr Jodene Nam Head/brain QQ Cm  Result Date:  01/18/2018 CLINICAL DATA:  TIA. Unsteady gait with trouble finding words. History of lung cancer. EXAM: MRI HEAD WITHOUT CONTRAST MRA HEAD WITHOUT CONTRAST TECHNIQUE: Multiplanar, multiecho pulse sequences of the brain and surrounding structures were obtained without intravenous contrast. Angiographic images of the head were obtained using MRA technique without contrast. COMPARISON:  CTA head neck from yesterday.  Brain MRI 12/27/2007 FINDINGS: MRI HEAD FINDINGS Brain: No acute infarction, hemorrhage, hydrocephalus, extra-axial collection or mass lesion. Generalized cortical atrophy, fairly mild for age. Minor, age congruent microvascular ischemic change in the periventricular white matter. Vascular: Vascular findings below. Increased T2 signal within the left transverse and sigmoid sinuses. These vessels are patent on CTA yesterday. Skull and upper cervical spine: No evidence of marrow lesion. Notable degenerative facet spurring on the left at C2-3. Sinuses/Orbits: Bilateral cataract resection. MRA HEAD FINDINGS Symmetric carotid arteries. Mild right vertebral artery dominance. No branch occlusion, proximal flow limiting stenosis, or aneurysm. There is generalized atheromatous irregularity of large vessels. Most notable is a moderate right paraclinoid ICA narrowing and a high-grade left P2/3 segment stenosis. IMPRESSION: Brain MRI: Senescent changes without acute finding. Intracranial MRA: 1. No emergent finding or change from CTA yesterday. 2. Intracranial atherosclerosis as described. Electronically Signed   By: Monte Fantasia M.D.   On: 01/18/2018 08:44   Ct Angio Chest Aorta W &/or Wo Contrast  Result Date: 01/03/2018 CLINICAL DATA:  F/u to [facility]. Dx 2005 H/o lung ca 2000, RLL removed Prev in pacs Former smoker^ EXAM: CT ANGIOGRAPHY CHEST WITH CONTRAST TECHNIQUE: Multidetector CT imaging of the chest was performed using the standard protocol during bolus administration of intravenous contrast. Multiplanar  CT image reconstructions and MIPs were obtained to evaluate the vascular anatomy. CONTRAST:  10mL ISOVUE-370 IOPAMIDOL (ISOVUE-370) INJECTION 76% Creatinine was obtained on site at [facility] at [address] Results: Creatinine 0.8 mg/dL.  GFR 81 COMPARISON:  01/11/2017 and previous FINDINGS: Cardiovascular: Heart size normal. Limited opacification of pulmonary artery branches; the exam was not optimized for detection of pulmonary emboli. Scattered coronary calcifications. Aortic valve leaflet coarse calcifications. There is good contrast opacification  of the thoracic aorta with transverse dimensions as follows: 4.5 cm sinuses of Valsalva 4 cm sino-tubular junction 4.7 cm mid ascending (previously 4.6) 3.5 cm distal ascending/proximal arch 2.7 cm distal arch 2.5 cm proximal descending 2.1 cm distal descending No dissection or stenosis. Classic 3 vessel brachiocephalic arterial origin anatomy without proximal stenosis. Partially calcified plaque in the arch and descending thoracic segment. Visualized proximal abdominal aorta unremarkable. Mediastinum/Nodes: No hilar or mediastinal adenopathy. Lungs/Pleura: No pleural effusion. No pneumothorax. Emphysema (ICD10-J43.9). No nodule or infiltrate. Upper Abdomen: No acute findings. 2.2 cm exophytic upper pole right renal cyst. Musculoskeletal: Anterior vertebral endplate spurring at multiple levels in the mid and lower thoracic spine. Review of the MIP images confirms the above findings. IMPRESSION: 1. 4.7 cm ascending thoracic aortic aneurysm (previously 4.6). Recommend semi-annual imaging followup by CTA or MRA and referral to cardiothoracic surgery if not already obtained. This recommendation follows 2010 ACCF/AHA/AATS/ACR/ASA/SCA/SCAI/SIR/STS/SVM Guidelines for the Diagnosis and Management of Patients With Thoracic Aortic Disease. Circulation. 2010; 121: W109-N235 2. Aortic valve leaflet calcifications. Correlate with any echocardiographic evidence of  stenosis or insufficiency. 3. Coronary calcifications. The severity of coronary artery disease and any potential stenosis cannot be assessed on this non-gated CT examination. Assessment for potential risk factor modification, dietary therapy or pharmacologic therapy may be warranted, if clinically indicated. 4. Pulmonary emphysema Electronically Signed   By: Lucrezia Europe M.D.   On: 01/03/2018 13:39   Ct Head Code Stroke Wo Contrast  Result Date: 01/17/2018 CLINICAL DATA:  Code stroke. 84 y/o M; severe ataxia and dizziness. History of thoracic aortic dissection. EXAM: CT HEAD WITHOUT CONTRAST TECHNIQUE: Contiguous axial images were obtained from the base of the skull through the vertex without intravenous contrast. COMPARISON:  09/07/2010 CT head. FINDINGS: Brain: No evidence of acute infarction, hemorrhage, hydrocephalus, extra-axial collection or mass lesion/mass effect. Mild progression of chronic microvascular ischemic changes and volume loss of the brain. Vascular: Calcific atherosclerosis of the carotid siphons. No hyperdense vessel identified. Skull: Normal. Negative for fracture or focal lesion. Sinuses/Orbits: No acute finding. Other: Bilateral intra-ocular lens replacement. ASPECTS [facility] Stroke Program Early CT Score) - Ganglionic level infarction (caudate, lentiform nuclei, internal capsule, insula, M1-M3 cortex): 7 - Supraganglionic infarction (M4-M6 cortex): 3 Total score (0-10 with 10 being normal): 10 IMPRESSION: 1. No acute intracranial abnormality identified. 2. ASPECTS is 10 3. Mild progression of chronic microvascular ischemic changes and volume loss of the brain. These results were called by telephone at the time of interpretation on 01/17/2018 at 10:05 pm to Dr. Noemi Chapel , who verbally acknowledged these results. Electronically Signed   By: Kristine Garbe M.D.   On: 01/17/2018 22:08    Discharge Exam: Vitals:   01/18/18 1200 01/18/18 1400  BP: (!) 164/66 (!) 133/31   Pulse:    Resp: 14 13  Temp:    SpO2:     Vitals:   01/18/18 0900 01/18/18 1000 01/18/18 1200 01/18/18 1400  BP:   (!) 164/66 (!) 133/31  Pulse:      Resp: 14 15 14 13   Temp: 98 F (36.7 C)     TempSrc: Oral     SpO2:      Weight:      Height:        General: Pt is alert, awake, not in acute distress Cardiovascular: RRR, S1/S2 +, no rubs, no gallops Respiratory: CTA bilaterally, no wheezing, no rhonchi Abdominal: Soft, NT, ND, bowel sounds + Extremities: no edema, no cyanosis    The results  of significant diagnostics from this hospitalization (including imaging, microbiology, ancillary and laboratory) are listed below for reference.     Microbiology: No results found for this or any previous visit (from the past 240 hour(s)).   Labs: BNP (last 3 results) No results for input(s): BNP in the last 8760 hours. Basic Metabolic Panel: Recent Labs  Lab 01/17/18 2213 01/17/18 2306  NA 140 141  K 3.9 4.1  CL 107 103  CO2 26  --   GLUCOSE 110* 121*  BUN 18 18  CREATININE 0.95 0.90  CALCIUM 9.2  --   MG 2.3  --   PHOS 3.3  --    Liver Function Tests: Recent Labs  Lab 01/17/18 2213  AST 19  ALT 17  ALKPHOS 65  BILITOT 0.4  PROT 7.2  ALBUMIN 4.0   No results for input(s): LIPASE, AMYLASE in the last 168 hours. No results for input(s): AMMONIA in the last 168 hours. CBC: Recent Labs  Lab 01/17/18 2213 01/17/18 2306  WBC 7.3  --   NEUTROABS 5.4  --   HGB 14.5 15.0  HCT 46.3 44.0  MCV 91.0  --   PLT 188  --    Cardiac Enzymes: No results for input(s): CKTOTAL, CKMB, CKMBINDEX, TROPONINI in the last 168 hours. BNP: Invalid input(s): POCBNP CBG: No results for input(s): GLUCAP in the last 168 hours. D-Dimer No results for input(s): DDIMER in the last 72 hours. Hgb A1c No results for input(s): HGBA1C in the last 72 hours. Lipid Profile Recent Labs    01/18/18 0444  CHOL 141  HDL 40*  LDLCALC 95  TRIG 31  CHOLHDL 3.5   Thyroid function  studies No results for input(s): TSH, T4TOTAL, T3FREE, THYROIDAB in the last 72 hours.  Invalid input(s): FREET3 Anemia work up No results for input(s): VITAMINB12, FOLATE, FERRITIN, TIBC, IRON, RETICCTPCT in the last 72 hours. Urinalysis    Component Value Date/Time   COLORURINE YELLOW 01/17/2018 2141   APPEARANCEUR CLEAR 01/17/2018 2141   LABSPEC 1.031 (H) 01/17/2018 2141   PHURINE 6.0 01/17/2018 2141   GLUCOSEU NEGATIVE 01/17/2018 2141   HGBUR NEGATIVE 01/17/2018 2141   BILIRUBINUR NEGATIVE 01/17/2018 2141   KETONESUR NEGATIVE 01/17/2018 2141   PROTEINUR NEGATIVE 01/17/2018 2141   UROBILINOGEN 0.2 10/08/2011 0925   NITRITE NEGATIVE 01/17/2018 2141   LEUKOCYTESUR NEGATIVE 01/17/2018 2141   Sepsis Labs Invalid input(s): PROCALCITONIN,  WBC,  LACTICIDVEN Microbiology No results found for this or any previous visit (from the past 240 hour(s)).   Time coordinating discharge: 35 minutes  SIGNED:   Rodena Goldmann, DO [facility] 01/18/2018, 3:04 PM Pager [phone]  If 7PM-7AM, please contact night-coverage [email or web address] Password [email or web address]

## 2018-01-18 NOTE — Evaluation (Signed)
Occupational Therapy Evaluation Patient Details Name: Brian Austin MRN: [id number] DOB: 1930/08/29 Today's Date: 01/18/2018    History of Present Illness Brian Austin is a 84 y.o. male with medical history significant of osteoarthritis, BPH, history of CAD, stent placement x2, type 2 diabetes, GERD, hyperlipidemia, hypertension, history of squamous cell lung carcinoma, history of spontaneous pneumothorax, history of thoracic ascending aortic aneurysm who is coming to the emergency department due to unsteady gait, grogginess and dry mouth as started earlier in the evening.  He has some trouble finding words, but this has since then resolved.  No headache, vision changes, chest pain, palpitations, diaphoresis, dyspnea, PND, orthopnea or pitting edema of the lower extremities.  He has frequent rhinorrhea, which she attributes to allergies.  He denies sore throat, wheezing, hemoptysis, abdominal pain, nausea, emesis, melena or hematochezia.  He has occasional diarrhea and relatively frequent constipation.  Positive nocturia, about 3 times at night, but denies dysuria or hematuria.  Denies polyuria, polydipsia or polyphagia.   Clinical Impression   Pt received supine in bed, easily awakened. Pt reports dizziness is much improved, now only experiencing occasional lightheadedness. Nsg reports they have been walking with him this morning without difficulty. Pt demonstrates BUE strength WNL, coordination and sensation intact. Pt is completing ADLs independently. No further acute OT services required at pt is at baseline with ADL completion.     Follow Up Recommendations  No OT follow up    Equipment Recommendations  None recommended by OT       Precautions / Restrictions Precautions Precautions: None Restrictions Weight Bearing Restrictions: No      Mobility Bed Mobility Overal bed mobility: Modified Independent                Transfers Overall transfer level: Modified  independent Equipment used: None                      ADL either performed or assessed with clinical judgement   ADL Overall ADL's : Modified independent;At baseline                                       General ADL Comments: Pt completing ADLs without difficulty     Vision Baseline Vision/History: No visual deficits Patient Visual Report: No change from baseline Vision Assessment?: No apparent visual deficits            Pertinent Vitals/Pain Pain Assessment: No/denies pain     Hand Dominance Right   Extremity/Trunk Assessment Upper Extremity Assessment Upper Extremity Assessment: Overall WFL for tasks assessed   Lower Extremity Assessment Lower Extremity Assessment: Defer to PT evaluation   Cervical / Trunk Assessment Cervical / Trunk Assessment: Normal   Communication Communication Communication: HOH   Cognition Arousal/Alertness: Awake/alert Behavior During Therapy: WFL for tasks assessed/performed Overall Cognitive Status: Within Functional Limits for tasks assessed                                                Home Living Family/patient expects to be discharged to:: Private residence Living Arrangements: Alone   Type of Home: Mobile home Home Access: Stairs to enter [employer] of Steps: 5 Entrance Stairs-Rails: Right;Left;Can reach both Home Layout: One level     Bathroom Shower/Tub:  Tub/shower unit   Bathroom Toilet: Standard     Home Equipment: Walker - 2 wheels          Prior Functioning/Environment Level of Independence: Independent        Comments: Independent with ADLs, driving        OT Problem List: Decreased safety awareness    AM-PAC OT "6 Clicks" Daily Activity     Outcome Measure Help from another person eating meals?: None Help from another person taking care of personal grooming?: None Help from another person toileting, which includes using toliet, bedpan, or  urinal?: None Help from another person bathing (including washing, rinsing, drying)?: None Help from another person to put on and taking off regular upper body clothing?: None Help from another person to put on and taking off regular lower body clothing?: None 6 Click Score: 24   End of Session    Activity Tolerance: Patient tolerated treatment well Patient left: in bed;with call bell/phone within reach  OT Visit Diagnosis: Muscle weakness (generalized) (M62.81)                Time: 7290-2111 OT Time Calculation (min): 19 min Charges:  OT General Charges $OT Visit: 1 Visit OT Evaluation $OT Eval Low Complexity: [place], OTR/L  [phone] 01/18/2018, 9:39 AM

## 2018-01-18 NOTE — Progress Notes (Signed)
Discharge instructions given to patient. Patient verbalized understanding. Peripheral IV removed per order. Patient walked down with nurse to lobby to wait for ride. Being discharged to home.

## 2018-01-18 NOTE — Evaluation (Signed)
Physical Therapy Evaluation Patient Details Name: Brian Austin MRN: [id number] DOB: [birth year] Today's Date: 01/18/2018   History of Present Illness  Brian Austin is a 84 y.o. male with medical history significant of osteoarthritis, BPH, history of CAD, stent placement x2, type 2 diabetes, GERD, hyperlipidemia, hypertension, history of squamous cell lung carcinoma, history of spontaneous pneumothorax, history of thoracic ascending aortic aneurysm who is coming to the emergency department due to unsteady gait, grogginess and dry mouth as started earlier in the evening.  He has some trouble finding words, but this has since then resolved.  No headache, vision changes, chest pain, palpitations, diaphoresis, dyspnea, PND, orthopnea or pitting edema of the lower extremities.  He has frequent rhinorrhea, which she attributes to allergies.  He denies sore throat, wheezing, hemoptysis, abdominal pain, nausea, emesis, melena or hematochezia.  He has occasional diarrhea and relatively frequent constipation.  Positive nocturia, about 3 times at night, but denies dysuria or hematuria.  Denies polyuria, polydipsia or polyphagia.    Clinical Impression  Patient functioning near baseline for functional mobility and gait other than slightly slower than normal speed of cadence, no loss of balance and demonstrates good return for going up/down stairs using bilateral side rails.  Plan:  Patient discharged from physical therapy to care of nursing for ambulation daily as tolerated for length of stay.    Follow Up Recommendations No PT follow up    Equipment Recommendations  None recommended by PT    Recommendations for Other Services       Precautions / Restrictions Precautions Precautions: None Restrictions Weight Bearing Restrictions: No      Mobility  Bed Mobility Overal bed mobility: Modified Independent                Transfers Overall transfer level: Modified independent Equipment  used: None                Ambulation/Gait Ambulation/Gait assistance: Modified independent (Device/Increase time) Gait Distance (Feet): 150 Feet Assistive device: None Gait Pattern/deviations: WFL(Within Functional Limits) Gait velocity: decreased   General Gait Details: demonstrates good return for ambulation in hallways without loss of balance  Stairs Stairs: Yes Stairs assistance: Modified independent (Device/Increase time) Stair Management: Two rails;Step to pattern Number of Stairs: 9 General stair comments: demonstrates good return for going up/down steps using bilateral siderails without loss of balance  Wheelchair Mobility    Modified Rankin (Stroke Patients Only)       Balance Overall balance assessment: Mild deficits observed, not formally tested                                           Pertinent Vitals/Pain Pain Assessment: No/denies pain    Home Living Family/patient expects to be discharged to:: Private residence Living Arrangements: Alone Available Help at Discharge: Neighbor Type of Home: Mobile home Home Access: Stairs to enter Entrance Stairs-Rails: Right;Left;Can reach both Entrance Stairs-Number of Steps: 5 Home Layout: One level Home Equipment: Environmental consultant - 2 wheels;Crutches      Prior Function Level of Independence: Independent         Comments: community ambulator, drives     Hand Dominance   Dominant Hand: Right    Extremity/Trunk Assessment   Upper Extremity Assessment Upper Extremity Assessment: Defer to OT evaluation    Lower Extremity Assessment Lower Extremity Assessment: Overall WFL for tasks assessed  Cervical / Trunk Assessment Cervical / Trunk Assessment: Normal  Communication   Communication: No difficulties;HOH  Cognition Arousal/Alertness: Awake/alert Behavior During Therapy: WFL for tasks assessed/performed Overall Cognitive Status: Within Functional Limits for tasks assessed                                         General Comments      Exercises     Assessment/Plan    PT Assessment Patent does not need any further PT services  PT Problem List         PT Treatment Interventions      PT Goals (Current goals can be found in the Care Plan section)  Acute Rehab PT Goals Patient Stated Goal: return home PT Goal Formulation: With patient Time For Goal Achievement: 01/18/18 Potential to Achieve Goals: Good    Frequency     Barriers to discharge        Co-evaluation               AM-PAC PT "6 Clicks" Mobility  Outcome Measure Help needed turning from your back to your side while in a flat bed without using bedrails?: None Help needed moving from lying on your back to sitting on the side of a flat bed without using bedrails?: None Help needed moving to and from a bed to a chair (including a wheelchair)?: None Help needed standing up from a chair using your arms (e.g., wheelchair or bedside chair)?: None Help needed to walk in hospital room?: None Help needed climbing 3-5 steps with a railing? : None 6 Click Score: 24    End of Session   Activity Tolerance: Patient tolerated treatment well Patient left: in chair;with call bell/phone within reach Nurse Communication: Mobility status PT Visit Diagnosis: Unsteadiness on feet (R26.81);Other abnormalities of gait and mobility (R26.89);Muscle weakness (generalized) (M62.81)    Time: 7915-0569 PT Time Calculation (min) (ACUTE ONLY): 22 min   Charges:   PT Evaluation $PT Eval Moderate Complexity: 1 Mod PT Treatments $Therapeutic Activity: 23-37 mins        12:16 PM, 01/18/18 Lonell Grandchild, MPT Physical Therapist with [facility] [phone] office [phone] mobile phone

## 2018-01-18 NOTE — Progress Notes (Signed)
SLP Cancellation Note  Patient Details Name: Brian Austin MRN: [id number] DOB: March 23, 1930   Cancelled treatment:       Reason Eval/Treat Not Completed: SLP screened, Pt is at baseline for speech and cognition. No needs identified, Thank you for this referral, ST will sign off.  October Peery H. Roddie Mc, CCC-SLP Speech Language Pathologist    Wende Bushy 01/18/2018, 12:37 PM

## 2018-01-18 NOTE — Care Management Note (Signed)
Case Management Note  Patient Details  Name: AILTON VALLEY MRN: [id number] Date of Birth: [birth year]  Subjective/Objective:      TIA workup. MRI -. From home independent. Has PCP. Seen by PT and OT with no recommendations.               Action/Plan: DC home.   Expected Discharge Date:    01/18/2018              Expected Discharge Plan:  Home/Self Care  In-House Referral:     Discharge planning Services  CM Consult  Post Acute Care Choice:  NA Choice offered to:  NA  DME Arranged:    DME Agency:     HH Arranged:    HH Agency:     Status of Service:  Completed, signed off  If discussed at [employer] of Stay Meetings, dates discussed:    Additional Comments:  Treylon Henard, Chauncey Reading, RN 01/18/2018, 12:06 PM

## 2018-01-18 NOTE — Progress Notes (Signed)
*  PRELIMINARY RESULTS* Echocardiogram 2D Echocardiogram has been performed.  Samuel Germany 01/18/2018, 12:42 PM

## 2018-02-13 ENCOUNTER — Ambulatory Visit (INDEPENDENT_AMBULATORY_CARE_PROVIDER_SITE_OTHER): Payer: Medicare Other | Admitting: Vascular Surgery

## 2018-02-13 ENCOUNTER — Encounter: Payer: Self-pay | Admitting: Vascular Surgery

## 2018-02-13 VITALS — BP 132/57 | HR 64 | Temp 99.5°F | Resp 20 | Ht 67.5 in | Wt 152.0 lb

## 2018-02-13 DIAGNOSIS — I6522 Occlusion and stenosis of left carotid artery: Secondary | ICD-10-CM | POA: Diagnosis not present

## 2018-02-13 DIAGNOSIS — I259 Chronic ischemic heart disease, unspecified: Secondary | ICD-10-CM | POA: Diagnosis not present

## 2018-02-13 NOTE — Progress Notes (Signed)
[facility]ist of [place]  Patient name: Brian Austin MRN: [id number] DOB: 28-Jun-1930 Sex: male  REASON FOR CONSULT: Discuss extracranial cerebrovascular occlusive disease  Seen today in our [place] office  HPI: Brian Austin is a 84 y.o. male, who is seen today to discuss recent CT angiogram of his head and neck.  He has had an evaluation for dizziness which is been present for approximately 1 year.  He underwent CT scan of his head and neck for further evaluation of this.  This revealed a left 50% internal carotid artery stenosis at the bifurcation.  Had intracranial right internal carotid artery stenosis.  He specifically denies any prior episodes of amaurosis fugax, a aphasia or transient ischemic attack or stroke.  He does have a known ascending arch aneurysm and this is being followed with yearly CT scan with Dr. Roxan Hockey.  Past Medical History:  Diagnosis Date  . Arthritis   . BPH (benign prostatic hyperplasia)   . CAD (coronary artery disease)    s/p Xience stent ot the distal RAC 2010, PCI with Xience stent to distal RCA at the [facility] 2011  . COPD (chronic obstructive pulmonary disease) ([place]) 05/22/2008   FEV1/FVS 44.9  . Diabetes mellitus    Well controlled  . GERD (gastroesophageal reflux disease)   . Hyperlipidemia   . Hypertension   . Spontaneous pneumothorax   . Squamous cell carcinoma lung ([place]) 2000   CT w/ CM; 04/25/2009 COPD, s/p RLL, CAD, 5 cm ascending aortic aneurysm  . Thoracic ascending aortic aneurysm ([facility])    4.8 cm   . Type II or unspecified type diabetes mellitus without mention of complication, not stated as uncontrolled     Family History  Problem Relation Age of Onset  . Diabetes Father   . Alcohol abuse Father   . Heart disease Neg Hx   . Colon cancer Neg Hx   . Gastric cancer Neg Hx   . Esophageal cancer Neg Hx     SOCIAL HISTORY: Social History   Socioeconomic History  .  Marital status: Divorced    Spouse name: Not on file  . Number of children: Not on file  . Years of education: Not on file  . Highest education level: Not on file  Occupational History  . Occupation: Retired  Scientific laboratory technician  . Financial resource strain: Not on file  . Food insecurity:    Worry: Not on file    Inability: Not on file  . Transportation needs:    Medical: Not on file    Non-medical: Not on file  Tobacco Use  . Smoking status: Former Smoker    Last attempt to quit: 02/16/1984    Years since quitting: 34.0  . Smokeless tobacco: Never Used  . Tobacco comment: Smoked for 30 years  Substance and Sexual Activity  . Alcohol use: Yes    Comment: wine or beer with supper  . Drug use: No  . Sexual activity: Never  Lifestyle  . Physical activity:    Days per week: Not on file    Minutes per session: Not on file  . Stress: Not on file  Relationships  . Social connections:    Talks on phone: Not on file    Gets together: Not on file    Attends religious service: Not on file    Active member of club or organization: Not on file    Attends meetings of clubs or organizations: Not on file  Relationship status: Not on file  . Intimate partner violence:    Fear of current or ex partner: Not on file    Emotionally abused: Not on file    Physically abused: Not on file    Forced sexual activity: Not on file  Other Topics Concern  . Not on file  Social History Narrative   Retired [employer]   Divorced four times   3 children   Single    No Known Allergies  Current Outpatient Medications  Medication Sig Dispense Refill  . aspirin EC 81 MG tablet Take 81 mg by mouth daily.    . Hypromellose (ARTIFICIAL TEARS OP) Place 1 drop into both eyes 2 (two) times daily as needed (for dry eyes).    Marland Kitchen losartan (COZAAR) 25 MG tablet Take 25 mg by mouth daily as needed (for BP > 130).     . magnesium oxide (MAG-OX) 400 MG tablet Take 400 mg by mouth daily.    . nitroGLYCERIN  (NITROSTAT) 0.4 MG SL tablet Place 1 tablet (0.4 mg total) under the tongue every 5 (five) minutes as needed. 25 tablet 3  . Omega-3 Fatty Acids (FISH OIL) 1200 MG CAPS Take 1,200 mg by mouth daily.     . pantoprazole (PROTONIX) 40 MG tablet Take 40 mg by mouth daily. Take 1 tablet daily  5  . Tamsulosin HCl (FLOMAX) 0.4 MG CAPS Take 0.4 mg by mouth daily after supper.      No current facility-administered medications for this visit.     REVIEW OF SYSTEMS:  [X]  denotes positive finding, [ ]  denotes negative finding Cardiac  Comments:  Chest pain or chest pressure:    Shortness of breath upon exertion:    Short of breath when lying flat:    Irregular heart rhythm:        Vascular    Pain in calf, thigh, or hip brought on by ambulation:    Pain in feet at night that wakes you up from your sleep:     Blood clot in your veins:    Leg swelling:         Pulmonary    Oxygen at home:    Productive cough:     Wheezing:         Neurologic    Sudden weakness in arms or legs:     Sudden numbness in arms or legs:     Sudden onset of difficulty speaking or slurred speech:    Temporary loss of vision in one eye:  x   Problems with dizziness:  x       Gastrointestinal    Blood in stool:     Vomited blood:         Genitourinary    Burning when urinating:     Blood in urine:        Psychiatric    Major depression:         Hematologic    Bleeding problems:    Problems with blood clotting too easily:        Skin    Rashes or ulcers:        Constitutional    Fever or chills:      PHYSICAL EXAM: Vitals:   02/13/18 0922  BP: (!) 132/57  Pulse: 64  Resp: 20  Temp: 99.5 F (37.5 C)  Weight: 152 lb (68.9 kg)  Height: 5' 7.5" (1.715 m)    GENERAL: The patient is a well-nourished male, in no  acute distress. The vital signs are documented above. CARDIOVASCULAR: Carotid arteries without bruits bilaterally.  He has 2+ radial and 2+ posterior tibial pulses bilaterally PULMONARY:  There is good air exchange  ABDOMEN: Soft and non-tender  MUSCULOSKELETAL: There are no major deformities or cyanosis. NEUROLOGIC: No focal weakness or paresthesias are detected. SKIN: There are no ulcers or rashes noted. PSYCHIATRIC: The patient has a normal affect.  DATA:  I reviewed the CT scan of his chest and also CT scan of his carotids.  This does show extensive atherosclerotic plaque but moderate left internal carotid stenosis at the bifurcation and no evidence of stenosis on the right carotid.  He does have intracranial carotid disease  MEDICAL ISSUES: Discussed this in detail with the patient.  I explained that his moderate stenosis does not put him at any statistically increased risk for stroke.  I have recommended yearly carotid duplex to rule out progression of disease.  I did explain symptoms of carotid disease with him and he will present immediately should this occur.  I do not see any vascular cause of his dizziness.  He will see [place] in our [place] office in 1 year with repeat carotid duplex   Rosetta Posner, MD [facility] Tel [phone] Pager [phone]

## 2018-03-05 ENCOUNTER — Other Ambulatory Visit: Payer: Self-pay

## 2018-03-05 ENCOUNTER — Emergency Department (HOSPITAL_COMMUNITY)
Admission: EM | Admit: 2018-03-05 | Discharge: 2018-03-05 | Disposition: A | Discharge status: Home / Self Care | Payer: Medicare Other | Attending: Emergency Medicine | Admitting: Emergency Medicine

## 2018-03-05 ENCOUNTER — Encounter (HOSPITAL_COMMUNITY): Payer: Self-pay | Admitting: Emergency Medicine

## 2018-03-05 DIAGNOSIS — J449 Chronic obstructive pulmonary disease, unspecified: Secondary | ICD-10-CM | POA: Diagnosis not present

## 2018-03-05 DIAGNOSIS — Z87891 Personal history of nicotine dependence: Secondary | ICD-10-CM | POA: Insufficient documentation

## 2018-03-05 DIAGNOSIS — I10 Essential (primary) hypertension: Secondary | ICD-10-CM | POA: Diagnosis not present

## 2018-03-05 DIAGNOSIS — N401 Enlarged prostate with lower urinary tract symptoms: Secondary | ICD-10-CM | POA: Insufficient documentation

## 2018-03-05 DIAGNOSIS — N390 Urinary tract infection, site not specified: Secondary | ICD-10-CM | POA: Diagnosis not present

## 2018-03-05 DIAGNOSIS — R339 Retention of urine, unspecified: Secondary | ICD-10-CM | POA: Diagnosis present

## 2018-03-05 DIAGNOSIS — I2510 Atherosclerotic heart disease of native coronary artery without angina pectoris: Secondary | ICD-10-CM | POA: Insufficient documentation

## 2018-03-05 LAB — URINALYSIS, ROUTINE W REFLEX MICROSCOPIC
Bilirubin Urine: NEGATIVE
GLUCOSE, UA: NEGATIVE mg/dL
Ketones, ur: NEGATIVE mg/dL
Nitrite: POSITIVE — AB
Protein, ur: NEGATIVE mg/dL
Specific Gravity, Urine: 1.024 (ref 1.005–1.030)
WBC, UA: >50 WBC/hpf — ABNORMAL HIGH (ref 0–5)
pH: 6 (ref 5.0–8.0)

## 2018-03-05 MED ORDER — CEPHALEXIN 500 MG PO CAPS
500.0000 mg | ORAL_CAPSULE | Freq: Once | ORAL | Status: AC
  Administered 2018-03-05: 500 mg via ORAL
  Filled 2018-03-05: qty 1

## 2018-03-05 MED ORDER — CEPHALEXIN 500 MG PO CAPS: 500.0000 mg | ORAL_CAPSULE | Freq: Two times a day (BID) | ORAL | 0 refills | Status: AC

## 2018-03-05 NOTE — Discharge Instructions (Signed)
Thank you for letting us take care of you today!  Please obtain all of your results from medical records or have your doctors office obtain the results - share them with your doctor - you should be seen at your doctors office in the next 2 days. Call today to arrange your follow up. Take the medications as prescribed. Please review all of the medicines and only take them if you do not have an allergy to them. Please be aware that if you are taking birth control pills, taking other prescriptions, ESPECIALLY ANTIBIOTICS may make the birth control ineffective - if this is the case, either do not engage in sexual activity or use alternative methods of birth control such as condoms until you have finished the medicine and your family doctor says it is OK to restart them. If you are on a blood thinner such as COUMADIN, be aware that any other medicine that you take may cause the coumadin to either work too much, or not enough - you should have your coumadin level rechecked in next 7 days if this is the case.  ?  It is also a possibility that you have an allergic reaction to any of the medicines that you have been prescribed - Everybody reacts differently to medications and while MOST people have no trouble with most medicines, you may have a reaction such as nausea, vomiting, rash, swelling, shortness of breath. If this is the case, please stop taking the medicine immediately and contact your physician.   If you were given a medication in the ED such as percocet, vicodin, or morphine, be aware that these medicines are sedating and may change your ability to take care of yourself adequately for several hours after being given this medicines - you should not drive or take care of small children if you were given this medicine in the Emergency Department or if you have been prescribed these types of medicines. ?   You should return to the ER IMMEDIATELY if you develop severe or worsening symptoms.

## 2018-03-05 NOTE — ED Triage Notes (Signed)
Pt reports sudden onset of chills last night and difficulty urinating. Pt reports several episodes of same through the night. Pt reports was able to void this am but reports "still feels like something is blocking it." pt denies any pain at present.

## 2018-03-05 NOTE — ED Provider Notes (Signed)
[facility] EMERGENCY DEPARTMENT Provider Note   CSN: [id number] Arrival date & time: 03/05/18  1142     History   Chief Complaint Chief Complaint  Patient presents with  . Urinary Retention    HPI Brian Austin is a 85 y.o. male.  HPI  85 year old male with a known history of prostatic hyperplasia, has a history of coronary disease with stenting in the past as well as COPD.  He has had a history of squamous cell carcinoma of the lung and is known to have a history of, infrequent infections  He reports that since last night after dinner he has had the feeling of urgency to urinate, frequency but cannot maintain a steady stream and feels like he needs to go but nothing comes out.  He denies any abdominal pain, nausea vomiting or diarrhea.  He has no pain in his penis scrotum or testicles.  He does report some subjective fevers and chills last night but not this morning.  He has not done anything for the symptoms at this point, denies any frequent urinary tract infections  Past Medical History:  Diagnosis Date  . Arthritis   . BPH (benign prostatic hyperplasia)   . CAD (coronary artery disease)    s/p Xience stent ot the distal RAC 2010, PCI with Xience stent to distal RCA at the [facility] 2011  . COPD (chronic obstructive pulmonary disease) ([place]) 05/22/2008   FEV1/FVS 44.9  . Diabetes mellitus    Well controlled  . GERD (gastroesophageal reflux disease)   . Hyperlipidemia   . Hypertension   . Spontaneous pneumothorax   . Squamous cell carcinoma lung ([place]) 2000   CT w/ CM; 04/25/2009 COPD, s/p RLL, CAD, 5 cm ascending aortic aneurysm  . Thoracic ascending aortic aneurysm ([facility])    4.8 cm   . Type II or unspecified type diabetes mellitus without mention of complication, not stated as uncontrolled     Patient Active Problem List   Diagnosis Date Noted  . TIA (transient ischemic attack) 01/17/2018  . BPH (benign prostatic hyperplasia) 01/17/2018  . GERD (gastroesophageal  reflux disease) 01/17/2018  . Dyslipidemia 10/31/2017  . Aortic root dilatation ([place]) 10/31/2017  . Dysphagia 06/23/2017  . Odynophagia 06/23/2017  . Loss of weight 06/23/2017  . Bilateral carotid artery stenosis 10/31/2016  . Nontraumatic incomplete tear of right rotator cuff 01/22/2013  . Pain in joint, shoulder region 01/22/2013  . Dog bite of hand 11/10/2012  . Cellulitis and abscess of hand 11/10/2012  . Hyperlipidemia   . Type 2 diabetes mellitus ([place])   . Jaw pain   . CAD (coronary artery disease)   . Thoracic aortic aneurysm (TAA) ([place]) 04/28/2009  . HEMOPTYSIS UNSPECIFIED 04/25/2009  . CHEST PAIN, ATYPICAL 04/25/2009  . LUNG CANCER, HX OF 04/25/2009  . Chronic ischemic heart disease 07/02/2008  . MITRAL REGURGITATION 06/28/2008  . Essential hypertension 06/28/2008  . COPD (chronic obstructive pulmonary disease) ([place]) 06/28/2008  . CARDIOVASCULAR FUNCTION STUDY, ABNORMAL 06/19/2008  . AODM 06/13/2008  . Hyperlipemia 06/13/2008  . JAW PAIN 06/13/2008  . PRECORDIAL PAIN 06/13/2008    Past Surgical History:  Procedure Laterality Date  . BACK SURGERY    . CARDIAC CATHETERIZATION  2010   Stent  . ESOPHAGOGASTRODUODENOSCOPY N/A 09/30/2017   Procedure: ESOPHAGOGASTRODUODENOSCOPY (EGD);  Surgeon: Daneil Dolin, MD;  Location[facility];  Service: Endoscopy;  Laterality: N/A;  2:15PM  . I&D EXTREMITY Left 11/10/2012   Procedure: IRRIGATION AND DEBRIDEMENT Left third-long finger ;  Surgeon: Jolyn Nap, MD;  Location: [place];  Service: Orthopedics;  Laterality: Left;  . LOBECTOMY  2000   Right lower for squamous cell ca  . LUMBAR LAMINECTOMY  1978  . Lymph node resected  1983   Axilla  . MALONEY DILATION N/A 09/30/2017   Procedure: Venia Minks DILATION;  Surgeon: Daneil Dolin, MD;  Location: [facility];  Service: Endoscopy;  Laterality: N/A;  . Needle removed from the right foot    . Spontaneous pneumothrax  1979        Home Medications    Prior to  Admission medications   Medication Sig Start Date End Date Taking? Authorizing Provider  aspirin EC 81 MG tablet Take 81 mg by mouth daily.    [provider]  Hypromellose (ARTIFICIAL TEARS OP) Place 1 drop into both eyes 2 (two) times daily as needed (for dry eyes).    [provider]  losartan (COZAAR) 25 MG tablet Take 25 mg by mouth daily as needed (for BP > 130).  04/14/17   [provider]  magnesium oxide (MAG-OX) 400 MG tablet Take 400 mg by mouth daily.    [provider]  nitroGLYCERIN (NITROSTAT) 0.4 MG SL tablet Place 1 tablet (0.4 mg total) under the tongue every 5 (five) minutes as needed. 10/31/17   Minus Breeding, MD  Omega-3 Fatty Acids (FISH OIL) 1200 MG CAPS Take 1,200 mg by mouth daily.     [provider]  pantoprazole (PROTONIX) 40 MG tablet Take 40 mg by mouth daily. Take 1 tablet daily 10/03/17   [provider]  Tamsulosin HCl (FLOMAX) 0.4 MG CAPS Take 0.4 mg by mouth daily after supper.     [provider]    Family History Family History  Problem Relation Age of Onset  . Diabetes Father   . Alcohol abuse Father   . Heart disease Neg Hx   . Colon cancer Neg Hx   . Gastric cancer Neg Hx   . Esophageal cancer Neg Hx     Social History Social History   Tobacco Use  . Smoking status: Former Smoker    Last attempt to quit: 02/16/1984    Years since quitting: 34.0  . Smokeless tobacco: Never Used  . Tobacco comment: Smoked for 30 years  Substance Use Topics  . Alcohol use: Yes    Comment: wine or beer with supper  . Drug use: No     Allergies   Patient has no known allergies.   Review of Systems Review of Systems  All other systems reviewed and are negative.    Physical Exam Updated Vital Signs BP 102/78 (BP Location: Left Arm)   Pulse 86   Temp 98 F (36.7 C) (Oral)   Resp 18   Ht 1.715 m (5' 7.5")   Wt 65.8 kg   SpO2 96%   BMI 22.38 kg/m   Physical Exam Vitals signs and  nursing note reviewed.  Constitutional:      General: He is not in acute distress.    Appearance: He is well-developed.  HENT:     Head: Normocephalic and atraumatic.     Mouth/Throat:     Pharynx: No oropharyngeal exudate.  Eyes:     General: No scleral icterus.       Right eye: No discharge.        Left eye: No discharge.     Conjunctiva/sclera: Conjunctivae normal.     Pupils: Pupils are equal, round,  and reactive to light.  Neck:     Musculoskeletal: Normal range of motion and neck supple.     Thyroid: No thyromegaly.     Vascular: No JVD.  Cardiovascular:     Rate and Rhythm: Normal rate and regular rhythm.     Heart sounds: Normal heart sounds. No murmur. No friction rub. No gallop.   Pulmonary:     Effort: Pulmonary effort is normal. No respiratory distress.     Breath sounds: Normal breath sounds. No wheezing or rales.  Abdominal:     General: Bowel sounds are normal. There is no distension.     Palpations: Abdomen is soft. There is no mass.     Tenderness: There is no abdominal tenderness.     Comments: Normal abdomen without tenderness distention tympanitic sounds to percussion or abnormal bowel sounds.  Genitourinary:    Comments: Normal-appearing uncircumcised penis, foreskin is easily retracted, normal-appearing corona, urethral meatus is clear without discharge, bleeding or urinary incontinence, normal-appearing scrotum and testicles without tenderness or masses. Musculoskeletal: Normal range of motion.        General: No tenderness.  Lymphadenopathy:     Cervical: No cervical adenopathy.  Skin:    General: Skin is warm and dry.     Findings: No erythema or rash.  Neurological:     Mental Status: He is alert.     Coordination: Coordination normal.  Psychiatric:        Behavior: Behavior normal.      ED Treatments / Results  Labs (all labs ordered are listed, but only abnormal results are displayed) Labs Reviewed  URINE CULTURE  URINALYSIS, ROUTINE W  REFLEX MICROSCOPIC    EKG None  Radiology No results found.  Procedures Procedures (including critical care time)  Medications Ordered in ED Medications - No data to display   Initial Impression / Assessment and Plan / ED Course  I have reviewed the triage vital signs and the nursing notes.  Pertinent labs & imaging results that were available during my care of the patient were reviewed by me and considered in my medical decision making (see chart for details).  Clinical Course as of Mar 06 1343  Sun Mar 05, 2018  1343 UTi present - culture sent - no abnormal VS.   [BM]    Clinical Course User Index [BM] Noemi Chapel, MD    The exam is very unremarkable, question whether this could be a urinary tract infection, cystitis, he has no pelvic pain or abdominal pain or fever or tachycardia, doubt prostatitis.  Final Clinical Impressions(s) / ED Diagnoses   Final diagnoses:  Lower urinary tract infectious disease      Noemi Chapel, MD 03/05/18 1345

## 2018-03-07 LAB — URINE CULTURE: Culture: >=100000 — AB

## 2018-03-08 ENCOUNTER — Telehealth: Payer: Self-pay

## 2018-03-08 NOTE — Telephone Encounter (Signed)
Post ED Visit - Positive Culture Follow-up: Unsuccessful Patient Follow-up  Culture assessed and recommendations reviewed by:  []  Elenor Quinones, Pharm.D. []  Heide Guile, Pharm.D., BCPS AQ-ID []  Parks Neptune, Pharm.D., BCPS []  Alycia Rossetti, Pharm.D., BCPS []  [place], [place].D., BCPS, AAHIVP []  Legrand Como, Pharm.D., BCPS, AAHIVP []  Wynell Balloon, PharmD []  Vincenza Hews, PharmD, BCPS  Apolonio Schneiders [place] Pharm D Positive urine culture  []  Patient discharged without antimicrobial prescription and treatment is now indicated [x]  Organism is resistant to prescribed ED discharge antimicrobial []  Patient with positive blood cultures   Unable to contact patient after 3 attempts, letter will be sent to address on file  Brian Austin 03/08/2018, 10:10 AM

## 2018-03-08 NOTE — Progress Notes (Signed)
ED Antimicrobial Stewardship Positive Culture Follow Up   Brian Austin is an 85 y.o. male who presented to [facility] on 03/05/2018 with a chief complaint of  Chief Complaint  Patient presents with  . Urinary Retention    Recent Results (from the past 720 hour(s))  Urine Culture     Status: Abnormal   Collection Time: 03/05/18 12:14 PM  Result Value Ref Range Status   Specimen Description   Final    URINE, CLEAN CATCH Performed at [facility], [address] [place], [place] [address]    Special Requests   Final    NONE Performed at [facility], [address], [place], [place] [address]    Culture >=100,000 COLONIES/mL ENTEROBACTER AEROGENES (A)  Final   Report Status 03/07/2018 FINAL  Final   Organism ID, Bacteria ENTEROBACTER AEROGENES (A)  Final      Susceptibility   Enterobacter aerogenes - MIC*    CEFAZOLIN >=64 RESISTANT Resistant     CEFTRIAXONE <=1 SENSITIVE Sensitive     CIPROFLOXACIN <=0.25 SENSITIVE Sensitive     GENTAMICIN <=1 SENSITIVE Sensitive     IMIPENEM 2 SENSITIVE Sensitive     NITROFURANTOIN 128 RESISTANT Resistant     TRIMETH/SULFA <=20 SENSITIVE Sensitive     PIP/TAZO <=4 SENSITIVE Sensitive     * >=100,000 COLONIES/mL ENTEROBACTER AEROGENES    [x]  Treated with cephalexin, organism resistant to prescribed antimicrobial []  Patient discharged originally without antimicrobial agent and treatment is now indicated  New antibiotic prescription: DC cephalexin, start cefpodoxime 200mg  PO BID x 10 days  ED Provider: Lenn Sink, PA   Kaysen Deal, Rande Lawman 03/08/2018, 8:46 AM Clinical Pharmacist Monday - Friday phone -  [phone] Saturday - Sunday phone - [phone]

## 2018-03-10 ENCOUNTER — Emergency Department (HOSPITAL_COMMUNITY)
Admission: EM | Admit: 2018-03-10 | Discharge: 2018-03-11 | Disposition: A | Discharge status: Home / Self Care | Payer: Medicare Other | Attending: Emergency Medicine | Admitting: Emergency Medicine

## 2018-03-10 ENCOUNTER — Encounter (HOSPITAL_COMMUNITY): Payer: Self-pay

## 2018-03-10 ENCOUNTER — Other Ambulatory Visit: Payer: Self-pay

## 2018-03-10 DIAGNOSIS — J449 Chronic obstructive pulmonary disease, unspecified: Secondary | ICD-10-CM | POA: Insufficient documentation

## 2018-03-10 DIAGNOSIS — Z85118 Personal history of other malignant neoplasm of bronchus and lung: Secondary | ICD-10-CM | POA: Insufficient documentation

## 2018-03-10 DIAGNOSIS — Z87891 Personal history of nicotine dependence: Secondary | ICD-10-CM | POA: Insufficient documentation

## 2018-03-10 DIAGNOSIS — I2510 Atherosclerotic heart disease of native coronary artery without angina pectoris: Secondary | ICD-10-CM | POA: Insufficient documentation

## 2018-03-10 DIAGNOSIS — M25462 Effusion, left knee: Secondary | ICD-10-CM | POA: Insufficient documentation

## 2018-03-10 DIAGNOSIS — E119 Type 2 diabetes mellitus without complications: Secondary | ICD-10-CM | POA: Insufficient documentation

## 2018-03-10 DIAGNOSIS — I10 Essential (primary) hypertension: Secondary | ICD-10-CM | POA: Insufficient documentation

## 2018-03-10 DIAGNOSIS — Z79899 Other long term (current) drug therapy: Secondary | ICD-10-CM | POA: Insufficient documentation

## 2018-03-10 NOTE — ED Triage Notes (Signed)
Pt reports left knee pain and swelling.  Pt states he fell yesterday and then the swelling worsened.

## 2018-03-11 ENCOUNTER — Emergency Department (HOSPITAL_COMMUNITY): Payer: Medicare Other

## 2018-03-11 ENCOUNTER — Inpatient Hospital Stay (HOSPITAL_COMMUNITY): Payer: Medicare Other

## 2018-03-11 ENCOUNTER — Encounter (HOSPITAL_COMMUNITY): Payer: Self-pay | Admitting: Radiology

## 2018-03-11 ENCOUNTER — Inpatient Hospital Stay (HOSPITAL_COMMUNITY)
Admission: EM | Admit: 2018-03-11 | Discharge: 2018-04-16 | DRG: 082 | Disposition: E | Payer: Medicare Other | Attending: Surgery | Admitting: Surgery

## 2018-03-11 DIAGNOSIS — Z66 Do not resuscitate: Secondary | ICD-10-CM | POA: Diagnosis not present

## 2018-03-11 DIAGNOSIS — S0219XA Other fracture of base of skull, initial encounter for closed fracture: Secondary | ICD-10-CM | POA: Diagnosis present

## 2018-03-11 DIAGNOSIS — S92101A Unspecified fracture of right talus, initial encounter for closed fracture: Secondary | ICD-10-CM | POA: Diagnosis present

## 2018-03-11 DIAGNOSIS — R402142 Coma scale, eyes open, spontaneous, at arrival to emergency department: Secondary | ICD-10-CM | POA: Diagnosis present

## 2018-03-11 DIAGNOSIS — Z8673 Personal history of transient ischemic attack (TIA), and cerebral infarction without residual deficits: Secondary | ICD-10-CM

## 2018-03-11 DIAGNOSIS — J449 Chronic obstructive pulmonary disease, unspecified: Secondary | ICD-10-CM | POA: Diagnosis present

## 2018-03-11 DIAGNOSIS — I10 Essential (primary) hypertension: Secondary | ICD-10-CM | POA: Diagnosis present

## 2018-03-11 DIAGNOSIS — S0181XA Laceration without foreign body of other part of head, initial encounter: Secondary | ICD-10-CM | POA: Diagnosis present

## 2018-03-11 DIAGNOSIS — I2510 Atherosclerotic heart disease of native coronary artery without angina pectoris: Secondary | ICD-10-CM | POA: Diagnosis present

## 2018-03-11 DIAGNOSIS — R402352 Coma scale, best motor response, localizes pain, at arrival to emergency department: Secondary | ICD-10-CM | POA: Diagnosis present

## 2018-03-11 DIAGNOSIS — Z85118 Personal history of other malignant neoplasm of bronchus and lung: Secondary | ICD-10-CM | POA: Diagnosis not present

## 2018-03-11 DIAGNOSIS — S82841A Displaced bimalleolar fracture of right lower leg, initial encounter for closed fracture: Secondary | ICD-10-CM | POA: Diagnosis present

## 2018-03-11 DIAGNOSIS — N390 Urinary tract infection, site not specified: Secondary | ICD-10-CM | POA: Diagnosis present

## 2018-03-11 DIAGNOSIS — Y92410 Unspecified street and highway as the place of occurrence of the external cause: Secondary | ICD-10-CM

## 2018-03-11 DIAGNOSIS — Z515 Encounter for palliative care: Secondary | ICD-10-CM | POA: Diagnosis not present

## 2018-03-11 DIAGNOSIS — J22 Unspecified acute lower respiratory infection: Secondary | ICD-10-CM | POA: Diagnosis not present

## 2018-03-11 DIAGNOSIS — R0902 Hypoxemia: Secondary | ICD-10-CM

## 2018-03-11 DIAGNOSIS — Z23 Encounter for immunization: Secondary | ICD-10-CM | POA: Diagnosis present

## 2018-03-11 DIAGNOSIS — N400 Enlarged prostate without lower urinary tract symptoms: Secondary | ICD-10-CM | POA: Diagnosis present

## 2018-03-11 DIAGNOSIS — S52601A Unspecified fracture of lower end of right ulna, initial encounter for closed fracture: Secondary | ICD-10-CM | POA: Diagnosis present

## 2018-03-11 DIAGNOSIS — Z7189 Other specified counseling: Secondary | ICD-10-CM | POA: Diagnosis not present

## 2018-03-11 DIAGNOSIS — T148XXA Other injury of unspecified body region, initial encounter: Secondary | ICD-10-CM

## 2018-03-11 DIAGNOSIS — S066X9A Traumatic subarachnoid hemorrhage with loss of consciousness of unspecified duration, initial encounter: Principal | ICD-10-CM | POA: Diagnosis present

## 2018-03-11 DIAGNOSIS — S0240FA Zygomatic fracture, left side, initial encounter for closed fracture: Secondary | ICD-10-CM | POA: Diagnosis present

## 2018-03-11 DIAGNOSIS — E785 Hyperlipidemia, unspecified: Secondary | ICD-10-CM | POA: Diagnosis present

## 2018-03-11 DIAGNOSIS — J9621 Acute and chronic respiratory failure with hypoxia: Secondary | ICD-10-CM | POA: Diagnosis not present

## 2018-03-11 DIAGNOSIS — M25539 Pain in unspecified wrist: Secondary | ICD-10-CM

## 2018-03-11 DIAGNOSIS — E119 Type 2 diabetes mellitus without complications: Secondary | ICD-10-CM | POA: Diagnosis present

## 2018-03-11 DIAGNOSIS — R52 Pain, unspecified: Secondary | ICD-10-CM | POA: Diagnosis not present

## 2018-03-11 DIAGNOSIS — I712 Thoracic aortic aneurysm, without rupture: Secondary | ICD-10-CM | POA: Diagnosis present

## 2018-03-11 DIAGNOSIS — R402232 Coma scale, best verbal response, inappropriate words, at arrival to emergency department: Secondary | ICD-10-CM | POA: Diagnosis present

## 2018-03-11 DIAGNOSIS — S065X9A Traumatic subdural hemorrhage with loss of consciousness of unspecified duration, initial encounter: Secondary | ICD-10-CM | POA: Diagnosis present

## 2018-03-11 DIAGNOSIS — R509 Fever, unspecified: Secondary | ICD-10-CM

## 2018-03-11 DIAGNOSIS — B9689 Other specified bacterial agents as the cause of diseases classified elsewhere: Secondary | ICD-10-CM | POA: Diagnosis present

## 2018-03-11 DIAGNOSIS — Z4659 Encounter for fitting and adjustment of other gastrointestinal appliance and device: Secondary | ICD-10-CM

## 2018-03-11 DIAGNOSIS — Z955 Presence of coronary angioplasty implant and graft: Secondary | ICD-10-CM

## 2018-03-11 DIAGNOSIS — R17 Unspecified jaundice: Secondary | ICD-10-CM | POA: Diagnosis not present

## 2018-03-11 DIAGNOSIS — R0602 Shortness of breath: Secondary | ICD-10-CM

## 2018-03-11 DIAGNOSIS — I609 Nontraumatic subarachnoid hemorrhage, unspecified: Secondary | ICD-10-CM

## 2018-03-11 HISTORY — DX: Urinary tract infection, site not specified: N39.0

## 2018-03-11 HISTORY — DX: Diverticulosis of intestine, part unspecified, without perforation or abscess without bleeding: K57.90

## 2018-03-11 LAB — COMPREHENSIVE METABOLIC PANEL
ALT: 36 U/L (ref 0–44)
ANION GAP: 13 (ref 5–15)
AST: 50 U/L — ABNORMAL HIGH (ref 15–41)
Albumin: 3.4 g/dL — ABNORMAL LOW (ref 3.5–5.0)
Alkaline Phosphatase: 48 U/L (ref 38–126)
BUN: 18 mg/dL (ref 8–23)
CO2: 24 mmol/L (ref 22–32)
Calcium: 8.7 mg/dL — ABNORMAL LOW (ref 8.9–10.3)
Chloride: 99 mmol/L (ref 98–111)
Creatinine, Ser: 0.78 mg/dL (ref 0.61–1.24)
GFR calc non Af Amer: >60 mL/min (ref >60)
Glucose, Bld: 178 mg/dL — ABNORMAL HIGH (ref 70–99)
Potassium: 3.8 mmol/L (ref 3.5–5.1)
Sodium: 136 mmol/L (ref 135–145)
Total Bilirubin: 1 mg/dL (ref 0.3–1.2)
Total Protein: 6.5 g/dL (ref 6.5–8.1)

## 2018-03-11 LAB — TYPE AND SCREEN
ABO/RH(D): A POS
Antibody Screen: NEGATIVE
UNIT DIVISION: 0
Unit division: 0

## 2018-03-11 LAB — PREPARE FRESH FROZEN PLASMA
Unit division: 0
Unit division: 0

## 2018-03-11 LAB — CBC
HCT: 41.6 % (ref 39.0–52.0)
Hemoglobin: 13 g/dL (ref 13.0–17.0)
MCH: 28.4 pg (ref 26.0–34.0)
MCHC: 31.3 g/dL (ref 30.0–36.0)
MCV: 91 fL (ref 80.0–100.0)
Platelets: 212 10*3/uL (ref 150–400)
RBC: 4.57 MIL/uL (ref 4.22–5.81)
RDW: 13.7 % (ref 11.5–15.5)
WBC: 17 10*3/uL — ABNORMAL HIGH (ref 4.0–10.5)
nRBC: 0 % (ref 0.0–0.2)

## 2018-03-11 LAB — BPAM RBC
BLOOD PRODUCT EXPIRATION DATE: 202002262359
Blood Product Expiration Date: 202002262359
ISSUE DATE / TIME: 202001251525
ISSUE DATE / TIME: 202001251525
Unit Type and Rh: 5100
Unit Type and Rh: 5100

## 2018-03-11 LAB — GLUCOSE, CAPILLARY
Glucose-Capillary: 168 mg/dL — ABNORMAL HIGH (ref 70–99)
Glucose-Capillary: 171 mg/dL — ABNORMAL HIGH (ref 70–99)
Glucose-Capillary: 136 mg/dL — ABNORMAL HIGH (ref 70–99)

## 2018-03-11 LAB — SYNOVIAL CELL COUNT + DIFF, W/ CRYSTALS
Crystals, Fluid: NONE SEEN
EOSINOPHILS-SYNOVIAL: 0 % (ref 0–1)
Lymphocytes-Synovial Fld: 31 % — ABNORMAL HIGH (ref 0–20)
Monocyte-Macrophage-Synovial Fluid: 2 % — ABNORMAL LOW (ref 50–90)
Neutrophil, Synovial: 67 % — ABNORMAL HIGH (ref 0–25)
Other Cells-SYN: 0
WBC, Synovial: 32667 /mm3 — ABNORMAL HIGH (ref 0–200)

## 2018-03-11 LAB — BPAM FFP
Blood Product Expiration Date: 202002142359
Blood Product Expiration Date: 202002142359
ISSUE DATE / TIME: 202001251525
ISSUE DATE / TIME: 202001251525
Unit Type and Rh: 6200
Unit Type and Rh: 6200

## 2018-03-11 LAB — GRAM STAIN: Gram Stain: NONE SEEN

## 2018-03-11 LAB — PROTIME-INR
INR: 1.2
PROTHROMBIN TIME: 15 s (ref 11.4–15.2)

## 2018-03-11 LAB — ABO/RH: ABO/RH(D): A POS

## 2018-03-11 LAB — ETHANOL: Alcohol, Ethyl (B): <10 mg/dL (ref <10)

## 2018-03-11 LAB — LACTIC ACID, PLASMA: LACTIC ACID, VENOUS: 2 mmol/L — AB (ref 0.5–1.9)

## 2018-03-11 LAB — MRSA PCR SCREENING: MRSA by PCR: NEGATIVE

## 2018-03-11 LAB — CDS SEROLOGY

## 2018-03-11 MED ORDER — HYDROMORPHONE HCL 1 MG/ML IJ SOLN
1.0000 mg | INTRAMUSCULAR | Status: DC | PRN
  Administered 2018-03-11 – 2018-03-18 (×40): 1 mg via INTRAVENOUS
  Filled 2018-03-11 (×40): qty 1

## 2018-03-11 MED ORDER — ORAL CARE MOUTH RINSE
15.0000 mL | Freq: Two times a day (BID) | OROMUCOSAL | Status: DC
  Administered 2018-03-11 – 2018-03-21 (×19): 15 mL via OROMUCOSAL

## 2018-03-11 MED ORDER — TETANUS-DIPHTH-ACELL PERTUSSIS 5-2.5-18.5 LF-MCG/0.5 IM SUSP
0.5000 mL | Freq: Once | INTRAMUSCULAR | Status: AC
  Administered 2018-03-11: 0.5 mL via INTRAMUSCULAR
  Filled 2018-03-11: qty 0.5

## 2018-03-11 MED ORDER — ONDANSETRON 4 MG PO TBDP: 4.0000 mg | ORAL_TABLET | Freq: Four times a day (QID) | ORAL | Status: DC | PRN

## 2018-03-11 MED ORDER — PHENYLEPHRINE 40 MCG/ML (10ML) SYRINGE FOR IV PUSH (FOR BLOOD PRESSURE SUPPORT)
PREFILLED_SYRINGE | INTRAVENOUS | Status: AC
  Filled 2018-03-11: qty 10

## 2018-03-11 MED ORDER — PANTOPRAZOLE SODIUM 40 MG IV SOLR
40.0000 mg | Freq: Every day | INTRAVENOUS | Status: DC
  Administered 2018-03-11 – 2018-03-18 (×7): 40 mg via INTRAVENOUS
  Filled 2018-03-11 (×7): qty 40

## 2018-03-11 MED ORDER — NAPROXEN 375 MG PO TABS: 375.0000 mg | ORAL_TABLET | Freq: Two times a day (BID) | ORAL | 0 refills | Status: AC | PRN

## 2018-03-11 MED ORDER — LORAZEPAM 2 MG/ML IJ SOLN
0.5000 mg | INTRAMUSCULAR | Status: DC | PRN
  Filled 2018-03-11 (×2): qty 1

## 2018-03-11 MED ORDER — DEXTROSE-NACL 5-0.9 % IV SOLN
INTRAVENOUS | Status: DC
  Administered 2018-03-11 – 2018-03-13 (×4): via INTRAVENOUS

## 2018-03-11 MED ORDER — LIDOCAINE HCL (PF) 2 % IJ SOLN
5.0000 mL | Freq: Once | INTRAMUSCULAR | Status: AC
  Administered 2018-03-11: 5 mL

## 2018-03-11 MED ORDER — METOPROLOL TARTRATE 5 MG/5ML IV SOLN
5.0000 mg | Freq: Four times a day (QID) | INTRAVENOUS | Status: DC | PRN
  Administered 2018-03-14: 5 mg via INTRAVENOUS
  Filled 2018-03-11: qty 5

## 2018-03-11 MED ORDER — TRAMADOL HCL 50 MG PO TABS: 50.0000 mg | ORAL_TABLET | Freq: Four times a day (QID) | ORAL | 0 refills | Status: AC | PRN

## 2018-03-11 MED ORDER — LIDOCAINE HCL (PF) 2 % IJ SOLN
INTRAMUSCULAR | Status: AC
  Filled 2018-03-11: qty 20

## 2018-03-11 MED ORDER — LEVETIRACETAM IN NACL 500 MG/100ML IV SOLN
500.0000 mg | Freq: Two times a day (BID) | INTRAVENOUS | Status: AC
  Administered 2018-03-11 – 2018-03-18 (×14): 500 mg via INTRAVENOUS
  Filled 2018-03-11 (×14): qty 100

## 2018-03-11 MED ORDER — IOHEXOL 300 MG/ML  SOLN
100.0000 mL | Freq: Once | INTRAMUSCULAR | Status: AC | PRN
  Administered 2018-03-11: 100 mL via INTRAVENOUS

## 2018-03-11 MED ORDER — PANTOPRAZOLE SODIUM 40 MG PO TBEC
40.0000 mg | DELAYED_RELEASE_TABLET | Freq: Every day | ORAL | Status: DC
  Filled 2018-03-11: qty 1

## 2018-03-11 MED ORDER — MIDAZOLAM HCL 2 MG/2ML IJ SOLN
INTRAMUSCULAR | Status: AC
  Filled 2018-03-11: qty 2

## 2018-03-11 MED ORDER — ONDANSETRON HCL 4 MG/2ML IJ SOLN: 4.0000 mg | Freq: Four times a day (QID) | INTRAMUSCULAR | Status: DC | PRN

## 2018-03-11 MED ORDER — INSULIN ASPART 100 UNIT/ML ~~LOC~~ SOLN
0.0000 [IU] | SUBCUTANEOUS | Status: DC
  Administered 2018-03-11: 3 [IU] via SUBCUTANEOUS
  Administered 2018-03-11: 2 [IU] via SUBCUTANEOUS
  Administered 2018-03-11: 3 [IU] via SUBCUTANEOUS
  Administered 2018-03-12 – 2018-03-18 (×9): 2 [IU] via SUBCUTANEOUS

## 2018-03-11 NOTE — ED Notes (Signed)
Pt family at bedside, updated by MD and neurosurgery. Family states the pt was at [facility] yesterday for left knee pain and diagnosed with a UTI.
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
## 2018-03-11 NOTE — ED Provider Notes (Signed)
[facility] EMERGENCY DEPARTMENT Provider Note   CSN: [id number] Arrival date & time: 03/10/18  2355     History   Chief Complaint Chief Complaint  Patient presents with  . Knee Pain    HPI Brian Austin is a 85 y.o. male.  Patient presents to the emergency department for evaluation of left knee injury.  Patient reports that he stepped in a hole while walking yesterday.  This caused him to fall and land on his in the left knee.  He reports immediate pain after the fall, but over the course of today he has noticed increasing pain and swelling of the left knee.     Past Medical History:  Diagnosis Date  . Arthritis   . BPH (benign prostatic hyperplasia)   . CAD (coronary artery disease)    s/p Xience stent ot the distal RAC 2010, PCI with Xience stent to distal RCA at the [facility] 2011  . COPD (chronic obstructive pulmonary disease) ([place]) 05/22/2008   FEV1/FVS 44.9  . Diabetes mellitus    Well controlled  . GERD (gastroesophageal reflux disease)   . Hyperlipidemia   . Hypertension   . Spontaneous pneumothorax   . Squamous cell carcinoma lung ([place]) 2000   CT w/ CM; 04/25/2009 COPD, s/p RLL, CAD, 5 cm ascending aortic aneurysm  . Thoracic ascending aortic aneurysm ([facility])    4.8 cm   . Type II or unspecified type diabetes mellitus without mention of complication, not stated as uncontrolled     Patient Active Problem List   Diagnosis Date Noted  . TIA (transient ischemic attack) 01/17/2018  . BPH (benign prostatic hyperplasia) 01/17/2018  . GERD (gastroesophageal reflux disease) 01/17/2018  . Dyslipidemia 10/31/2017  . Aortic root dilatation ([place]) 10/31/2017  . Dysphagia 06/23/2017  . Odynophagia 06/23/2017  . Loss of weight 06/23/2017  . Bilateral carotid artery stenosis 10/31/2016  . Nontraumatic incomplete tear of right rotator cuff 01/22/2013  . Pain in joint, shoulder region 01/22/2013  . Dog bite of hand 11/10/2012  . Cellulitis and abscess of hand 11/10/2012    . Hyperlipidemia   . Type 2 diabetes mellitus ([place])   . Jaw pain   . CAD (coronary artery disease)   . Thoracic aortic aneurysm (TAA) ([place]) 04/28/2009  . HEMOPTYSIS UNSPECIFIED 04/25/2009  . CHEST PAIN, ATYPICAL 04/25/2009  . LUNG CANCER, HX OF 04/25/2009  . Chronic ischemic heart disease 07/02/2008  . MITRAL REGURGITATION 06/28/2008  . Essential hypertension 06/28/2008  . COPD (chronic obstructive pulmonary disease) ([place]) 06/28/2008  . CARDIOVASCULAR FUNCTION STUDY, ABNORMAL 06/19/2008  . AODM 06/13/2008  . Hyperlipemia 06/13/2008  . JAW PAIN 06/13/2008  . PRECORDIAL PAIN 06/13/2008    Past Surgical History:  Procedure Laterality Date  . BACK SURGERY    . CARDIAC CATHETERIZATION  2010   Stent  . ESOPHAGOGASTRODUODENOSCOPY N/A 09/30/2017   Procedure: ESOPHAGOGASTRODUODENOSCOPY (EGD);  Surgeon: Daneil Dolin, MD;  [facility];  Service: Endoscopy;  Laterality: N/A;  2:15PM  . I&D EXTREMITY Left 11/10/2012   Procedure: IRRIGATION AND DEBRIDEMENT Left third-long finger ;  Surgeon: Jolyn Nap, MD;  Location: [place];  Service: Orthopedics;  Laterality: Left;  . LOBECTOMY  2000   Right lower for squamous cell ca  . LUMBAR LAMINECTOMY  1978  . Lymph node resected  1983   Axilla  . MALONEY DILATION N/A 09/30/2017   Procedure: Venia Minks DILATION;  Surgeon: Daneil Dolin, MD;  Location: [facility];  Service: Endoscopy;  Laterality:  N/A;  . Needle removed from the right foot    . Spontaneous pneumothrax  1979        Home Medications    Prior to Admission medications   Medication Sig Start Date End Date Taking? Authorizing Provider  aspirin EC 81 MG tablet Take 81 mg by mouth daily.    [provider]  cephALEXin (KEFLEX) 500 MG capsule Take 1 capsule (500 mg total) by mouth 2 (two) times daily for 7 days. 03/05/18 03/12/18  Noemi [place], MD  Hypromellose (ARTIFICIAL TEARS OP) Place 1 drop into both eyes 2 (two) times daily as needed (for dry eyes).     [provider]  losartan (COZAAR) 25 MG tablet Take 25 mg by mouth daily as needed (for BP > 130).  04/14/17   [provider]  magnesium oxide (MAG-OX) 400 MG tablet Take 400 mg by mouth daily.    [provider]  naproxen (NAPROSYN) 375 MG tablet Take 1 tablet (375 mg total) by mouth 2 (two) times daily as needed for mild pain or moderate pain. 02/28/2018   Orpah Greek, MD  nitroGLYCERIN (NITROSTAT) 0.4 MG SL tablet Place 1 tablet (0.4 mg total) under the tongue every 5 (five) minutes as needed. 10/31/17   Minus Breeding, MD  Omega-3 Fatty Acids (FISH OIL) 1200 MG CAPS Take 1,200 mg by mouth daily.     [provider]  pantoprazole (PROTONIX) 40 MG tablet Take 40 mg by mouth daily. Take 1 tablet daily 10/03/17   [provider]  Tamsulosin HCl (FLOMAX) 0.4 MG CAPS Take 0.4 mg by mouth daily after supper.     [provider]  traMADol (ULTRAM) 50 MG tablet Take 1 tablet (50 mg total) by mouth every 6 (six) hours as needed for moderate pain or severe pain. 02/28/2018   Orpah Greek, MD    Family History Family History  Problem Relation Age of Onset  . Diabetes Father   . Alcohol abuse Father   . Heart disease Neg Hx   . Colon cancer Neg Hx   . Gastric cancer Neg Hx   . Esophageal cancer Neg Hx     Social History Social History   Tobacco Use  . Smoking status: Former Smoker    Last attempt to quit: 02/16/1984    Years since quitting: 34.0  . Smokeless tobacco: Never Used  . Tobacco comment: Smoked for 30 years  Substance Use Topics  . Alcohol use: Yes    Comment: wine or beer with supper  . Drug use: No     Allergies   Patient has no known allergies.   Review of Systems Review of Systems  Musculoskeletal: Positive for arthralgias.  All other systems reviewed and are negative.    Physical Exam Updated Vital Signs BP 119/73 (BP Location: Right Arm)   Pulse 74   Temp 99.4 F (37.4 C) (Oral)   Resp 20    SpO2 97%   Physical Exam Vitals signs and nursing note reviewed.  Constitutional:      General: He is not in acute distress.    Appearance: Normal appearance. He is well-developed.  HENT:     Head: Normocephalic and atraumatic.     Right Ear: Hearing normal.     Left Ear: Hearing normal.     Nose: Nose normal.  Eyes:     Conjunctiva/sclera: Conjunctivae normal.     Pupils: Pupils are equal, round, and reactive to light.  Neck:  Musculoskeletal: Normal range of motion and neck supple.  Cardiovascular:     Rate and Rhythm: Regular rhythm.     Pulses:          Dorsalis pedis pulses are 2+ on the left side.     Heart sounds: S1 normal and S2 normal. No murmur. No friction rub. No gallop.   Pulmonary:     Effort: Pulmonary effort is normal. No respiratory distress.     Breath sounds: Normal breath sounds.  Chest:     Chest wall: No tenderness.  Abdominal:     General: Bowel sounds are normal.     Palpations: Abdomen is soft.     Tenderness: There is no abdominal tenderness. There is no guarding or rebound. Negative signs include Murphy's sign and McBurney's sign.     Hernia: No hernia is present.  Musculoskeletal:     Left knee: He exhibits decreased range of motion and effusion. He exhibits no deformity, no erythema and normal alignment. Tenderness found.  Skin:    General: Skin is warm and dry.     Findings: No rash.  Neurological:     Mental Status: He is alert and oriented to person, place, and time.     GCS: GCS eye subscore is 4. GCS verbal subscore is 5. GCS motor subscore is 6.     Cranial Nerves: No cranial nerve deficit.     Sensory: No sensory deficit.     Coordination: Coordination normal.  Psychiatric:        Speech: Speech normal.        Behavior: Behavior normal.        Thought Content: Thought content normal.      ED Treatments / Results  Labs (all labs ordered are listed, but only abnormal results are displayed) Labs Reviewed  SYNOVIAL CELL  COUNT + DIFF, W/ CRYSTALS - Abnormal; Notable for the following components:      Result Value   Color, Synovial RED (*)    Appearance-Synovial TURBID (*)    WBC, Synovial 32,667 (*)    Neutrophil, Synovial 67 (*)    Lymphocytes-Synovial Fld 31 (*)    Monocyte-Macrophage-Synovial Fluid 2 (*)    All other components within normal limits  GRAM STAIN  BODY FLUID CULTURE  GLUCOSE, BODY FLUID OTHER  PROTEIN, BODY FLUID (OTHER)    EKG None  Radiology Dg Knee Complete 4 Views Left  Result Date: 03/12/2018 CLINICAL DATA:  General knee pain EXAM: LEFT KNEE - COMPLETE 4+ VIEW COMPARISON:  01/21/2015 FINDINGS: Progression of femorotibial joint space narrowing, worst in the medial femorotibial space. Intermediate sized knee effusion. Mild chondrocalcinosis. No fracture or dislocation. IMPRESSION: Intermediate sized suprapatellar effusion with moderate femorotibial osteoarthrosis. Electronically Signed   By: Ulyses Jarred M.D.   On: 03/04/2018 00:27    Procedures .Joint Aspiration/Arthrocentesis Date/Time: 03/14/2018 3:33 AM Performed by: Orpah Greek, MD Authorized by: Orpah Greek, MD   Consent:    Consent obtained:  Written   Consent given by:  Patient   Risks discussed:  Bleeding, infection and pain Universal protocol:    Procedure explained and questions answered to patient or proxy's satisfaction: yes     Relevant documents present and verified: yes     Test results available and properly labeled: yes     Imaging studies available: yes     Required blood products, implants, devices, and special equipment available: yes     Site/side marked: yes     Immediately prior  to procedure, a time out was called: yes     Patient identity confirmed:  Verbally with patient Location:    Location:  Knee   Knee:  L knee Anesthesia (see MAR for exact dosages):    Anesthesia method:  Local infiltration   Local anesthetic:  Lidocaine 2% w/o epi Procedure details:     Preparation: Patient was prepped and draped in usual sterile fashion     Needle gauge:  18 G   Ultrasound guidance: no     Approach:  Medial   Aspirate amount:  70   Aspirate characteristics:  Bloody   Steroid injected: no     Specimen collected: yes   Post-procedure details:    Dressing:  Adhesive bandage   Patient tolerance of procedure:  Tolerated well, no immediate complications   (including critical care time)  Medications Ordered in ED Medications  lidocaine (XYLOCAINE) 2 % injection 5 mL (5 mLs Infiltration Given 03/14/2018 0144)     Initial Impression / Assessment and Plan / ED Course  I have reviewed the triage vital signs and the nursing notes.  Pertinent labs & imaging results that were available during my care of the patient were reviewed by me and considered in my medical decision making (see chart for details).     Patient presents to the emergency department for evaluation of painful swollen left knee.  Patient reports injury yesterday.  Examination reveals large effusion.  X-ray does not show any evidence of fracture.  I recommended arthrocentesis to help with his pain but also to ensure that there was no concomitant infection.  This was performed without difficulty.  I obtained 70 mL of bloody synovial fluid.  This was not gross blood, does not appear to be primary hemarthrosis.  Initial studies reveal 32,667 white blood cells which is very consistent with an inflammatory response, not infection.  Gram stain did not show any organisms.  Ace wrap applied, patient will rest the knee, minimal ambulation and follow-up with orthopedics.  Return to the ER for worsening pain, redness or fever.  Fluid culture pending.  Final Clinical Impressions(s) / ED Diagnoses   Final diagnoses:  Effusion of left knee    ED Discharge Orders         Ordered    naproxen (NAPROSYN) 375 MG tablet  2 times daily PRN     02/15/2018 0337    traMADol (ULTRAM) 50 MG tablet  Every 6 hours PRN       03/04/2018 0337           Orpah Greek, MD 02/24/2018 [phone]

## 2018-03-11 NOTE — Discharge Instructions (Addendum)
Return to the ER if your pain and swelling worsens, leg becomes red and more warm to the touch or you develop a fever.
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
## 2018-03-12 ENCOUNTER — Inpatient Hospital Stay (HOSPITAL_COMMUNITY): Payer: Medicare Other

## 2018-03-12 LAB — COMPREHENSIVE METABOLIC PANEL
ALBUMIN: 2.7 g/dL — AB (ref 3.5–5.0)
ALT: 42 U/L (ref 0–44)
AST: 67 U/L — ABNORMAL HIGH (ref 15–41)
Alkaline Phosphatase: 35 U/L — ABNORMAL LOW (ref 38–126)
Anion gap: 10 (ref 5–15)
BILIRUBIN TOTAL: 1 mg/dL (ref 0.3–1.2)
BUN: 15 mg/dL (ref 8–23)
CO2: 22 mmol/L (ref 22–32)
Calcium: 8.3 mg/dL — ABNORMAL LOW (ref 8.9–10.3)
Chloride: 107 mmol/L (ref 98–111)
Creatinine, Ser: 0.66 mg/dL (ref 0.61–1.24)
GFR calc Af Amer: >60 mL/min (ref >60)
GFR calc non Af Amer: >60 mL/min (ref >60)
GLUCOSE: 157 mg/dL — AB (ref 70–99)
Potassium: 3.9 mmol/L (ref 3.5–5.1)
Sodium: 139 mmol/L (ref 135–145)
Total Protein: 5.6 g/dL — ABNORMAL LOW (ref 6.5–8.1)

## 2018-03-12 LAB — GLUCOSE, CAPILLARY
Glucose-Capillary: 105 mg/dL — ABNORMAL HIGH (ref 70–99)
Glucose-Capillary: 110 mg/dL — ABNORMAL HIGH (ref 70–99)
Glucose-Capillary: 115 mg/dL — ABNORMAL HIGH (ref 70–99)
Glucose-Capillary: 118 mg/dL — ABNORMAL HIGH (ref 70–99)
Glucose-Capillary: 138 mg/dL — ABNORMAL HIGH (ref 70–99)
Glucose-Capillary: 143 mg/dL — ABNORMAL HIGH (ref 70–99)

## 2018-03-12 LAB — CBC
HCT: 33.2 % — ABNORMAL LOW (ref 39.0–52.0)
Hemoglobin: 10.5 g/dL — ABNORMAL LOW (ref 13.0–17.0)
MCH: 28.8 pg (ref 26.0–34.0)
MCHC: 31.6 g/dL (ref 30.0–36.0)
MCV: 91 fL (ref 80.0–100.0)
Platelets: 183 10*3/uL (ref 150–400)
RBC: 3.65 MIL/uL — ABNORMAL LOW (ref 4.22–5.81)
RDW: 13.7 % (ref 11.5–15.5)
WBC: 12.3 10*3/uL — ABNORMAL HIGH (ref 4.0–10.5)
nRBC: 0 % (ref 0.0–0.2)

## 2018-03-12 LAB — GLUCOSE, BODY FLUID OTHER: Glucose, Body Fluid Other: 62 mg/dL

## 2018-03-12 LAB — PROTEIN, BODY FLUID (OTHER): Total Protein, Body Fluid Other: 3.6 g/dL
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
## 2018-03-12 NOTE — Plan of Care (Signed)
  Problem: Nutrition: Goal: Adequate nutrition will be maintained Outcome: Progressing   

## 2018-03-12 NOTE — Progress Notes (Signed)
Orthopedic Tech Progress Note Patient Details:  Brian Austin [birth year] [id number]  Ortho Devices Type of Ortho Device: Ace wrap, Short leg splint, Stirrup splint Ortho Device/Splint Location: RLE Ortho Device/Splint Interventions: Ordered, Application   Post Interventions Patient Tolerated: Well Instructions Provided: Care of device   Braulio Bosch 03/12/2018, 6:35 PM

## 2018-03-13 ENCOUNTER — Other Ambulatory Visit: Payer: Self-pay

## 2018-03-13 ENCOUNTER — Encounter: Payer: Self-pay | Admitting: Internal Medicine

## 2018-03-13 ENCOUNTER — Ambulatory Visit: Payer: Medicare Other | Admitting: Nurse Practitioner

## 2018-03-13 ENCOUNTER — Telehealth: Payer: Self-pay | Admitting: Nurse Practitioner

## 2018-03-13 LAB — GLUCOSE, CAPILLARY
GLUCOSE-CAPILLARY: 107 mg/dL — AB (ref 70–99)
Glucose-Capillary: 134 mg/dL — ABNORMAL HIGH (ref 70–99)
Glucose-Capillary: 115 mg/dL — ABNORMAL HIGH (ref 70–99)
Glucose-Capillary: 108 mg/dL — ABNORMAL HIGH (ref 70–99)
Glucose-Capillary: 114 mg/dL — ABNORMAL HIGH (ref 70–99)

## 2018-03-13 MED ORDER — POTASSIUM CHLORIDE IN NACL 20-0.9 MEQ/L-% IV SOLN
INTRAVENOUS | Status: DC
  Administered 2018-03-13 – 2018-03-14 (×3): via INTRAVENOUS
  Administered 2018-03-15: 1 mL via INTRAVENOUS
  Administered 2018-03-15 – 2018-03-17 (×4): via INTRAVENOUS
  Filled 2018-03-13 (×9): qty 1000

## 2018-03-13 MED ORDER — SODIUM CHLORIDE 0.9 % IV SOLN: INTRAVENOUS | Status: DC

## 2018-03-13 MED ORDER — ACETAMINOPHEN 325 MG PO TABS: 650.0000 mg | ORAL_TABLET | Freq: Four times a day (QID) | ORAL | Status: DC | PRN
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
## 2018-03-13 NOTE — Progress Notes (Signed)
Paged Trauma MD Grandville Silos about patient's pupil size and reactivity. Pupils 4-5, very sluggish. Patient still follows some commands, and moves all extremities. Alert or easily arousable with voice. No new orders received at this time, will continue to monitor.
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
## 2018-03-13 NOTE — Telephone Encounter (Signed)
PATIENT WAS A NO SHOW AND LETTER SENT  °

## 2018-03-13 NOTE — Progress Notes (Deleted)
Referring Provider: Barry Dienes, NP Primary Care Physician:  Barry Dienes, NP Primary GI:  Dr.   Rayne Du chief complaint on file.   HPI:   Brian Austin is a 85 y.o. male who presents for follow-up on dysphasia.  The patient's last visit dated 09/26/2017 was canceled.  Most recent visit before then was 06/23/2017 for the same as well as odynophagia and weight loss.  At that time he stated he had had a colonoscopy 2 years prior and told he would need one due to age.  Intermittent constipation controlled with over-the-counter medicines.  Odynophagia for the past several years with some recent worsening, noted to be intermittent.  Also noted frequent solid food dysphagia; during episodes worried about aspiration.  Noted occasional regurgitation.  Subjective weight loss of 15 pounds in 6 months despite good appetite.  No other GI symptoms.  Recommended EGD, request previous colonoscopy records from [facility], follow-up in 3 months.  EGD initially scheduled for 08/10/2017 but deferred to 09/30/2017.  EGD found extensive erosive reflux esophagitis, Schatzki's ring status post dilation, normal duodenum.  Recommended begin Protonix 40 mg daily, no need for repeat EGD, follow-up in 3 months.  Today he states   Past Medical History:  Diagnosis Date  . Arthritis   . BPH (benign prostatic hyperplasia)   . CAD (coronary artery disease)    s/p Xience stent ot the distal RAC 2010, PCI with Xience stent to distal RCA at the [facility] 2011  . COPD (chronic obstructive pulmonary disease) ([place]) 05/22/2008   FEV1/FVS 44.9  . Diabetes mellitus    Well controlled  . GERD (gastroesophageal reflux disease)   . Hyperlipidemia   . Hypertension   . Spontaneous pneumothorax   . Squamous cell carcinoma lung ([place]) 2000   CT w/ CM; 04/25/2009 COPD, s/p RLL, CAD, 5 cm ascending aortic aneurysm  . Thoracic ascending aortic aneurysm (HCC)    4.8 cm   . Type II or unspecified type diabetes mellitus without mention of  complication, not stated as uncontrolled     Past Surgical History:  Procedure Laterality Date  . BACK SURGERY    . CARDIAC CATHETERIZATION  2010   Stent  . ESOPHAGOGASTRODUODENOSCOPY N/A 09/30/2017   Procedure: ESOPHAGOGASTRODUODENOSCOPY (EGD);  Surgeon: Daneil Dolin, MD;  [facility];  Service: Endoscopy;  Laterality: N/A;  2:15PM  . I&D EXTREMITY Left 11/10/2012   Procedure: IRRIGATION AND DEBRIDEMENT Left third-long finger ;  Surgeon: Jolyn Nap, MD;  Location: [place];  Service: Orthopedics;  Laterality: Left;  . LOBECTOMY  2000   Right lower for squamous cell ca  . LUMBAR LAMINECTOMY  1978  . Lymph node resected  1983   Axilla  . MALONEY DILATION N/A 09/30/2017   Procedure: Venia Minks DILATION;  Surgeon: Daneil Dolin, MD;  [facility];  Service: Endoscopy;  Laterality: N/A;  . Needle removed from the right foot    . Spontaneous pneumothrax  1979    Current Outpatient Medications  Medication Sig Dispense Refill  . aspirin EC 81 MG tablet Take 81 mg by mouth daily.    . Hypromellose (ARTIFICIAL TEARS OP) Place 1 drop into both eyes 2 (two) times daily as needed (for dry eyes).    Marland Kitchen losartan (COZAAR) 25 MG tablet Take 25 mg by mouth daily as needed (for BP > 130).     . magnesium oxide (MAG-OX) 400 MG tablet Take 400 mg by mouth daily.    . naproxen (  NAPROSYN) 375 MG tablet Take 1 tablet (375 mg total) by mouth 2 (two) times daily as needed for mild pain or moderate pain. 20 tablet 0  . nitroGLYCERIN (NITROSTAT) 0.4 MG SL tablet Place 1 tablet (0.4 mg total) under the tongue every 5 (five) minutes as needed. 25 tablet 3  . Omega-3 Fatty Acids (FISH OIL) 1200 MG CAPS Take 1,200 mg by mouth daily.     . pantoprazole (PROTONIX) 40 MG tablet Take 40 mg by mouth daily. Take 1 tablet daily  5  . Tamsulosin HCl (FLOMAX) 0.4 MG CAPS Take 0.4 mg by mouth daily after supper.     . traMADol (ULTRAM) 50 MG tablet Take 1 tablet (50 mg total) by mouth every 6 (six)  hours as needed for moderate pain or severe pain. 15 tablet 0   No current facility-administered medications for this visit.     Allergies as of 03/13/2018  . (No Known Allergies)    Family History  Problem Relation Age of Onset  . Diabetes Father   . Alcohol abuse Father   . Heart disease Neg Hx   . Colon cancer Neg Hx   . Gastric cancer Neg Hx   . Esophageal cancer Neg Hx     Social History   Socioeconomic History  . Marital status: Divorced    Spouse name: Not on file  . Number of children: Not on file  . Years of education: Not on file  . Highest education level: Not on file  Occupational History  . Occupation: Retired  Scientific laboratory technician  . Financial resource strain: Not on file  . Food insecurity:    Worry: Not on file    Inability: Not on file  . Transportation needs:    Medical: Not on file    Non-medical: Not on file  Tobacco Use  . Smoking status: Former Smoker    Last attempt to quit: 02/16/1984    Years since quitting: 34.0  . Smokeless tobacco: Never Used  . Tobacco comment: Smoked for 30 years  Substance and Sexual Activity  . Alcohol use: Yes    Comment: wine or beer with supper  . Drug use: No  . Sexual activity: Never  Lifestyle  . Physical activity:    Days per week: Not on file    Minutes per session: Not on file  . Stress: Not on file  Relationships  . Social connections:    Talks on phone: Not on file    Gets together: Not on file    Attends religious service: Not on file    Active member of club or organization: Not on file    Attends meetings of clubs or organizations: Not on file    Relationship status: Not on file  Other Topics Concern  . Not on file  Social History Narrative   Retired [employer]   Divorced four times   3 children   Single    Review of Systems: General: Negative for anorexia, weight loss, fever, chills, fatigue, weakness. Eyes: Negative for vision changes.  ENT: Negative for hoarseness, difficulty swallowing ,  nasal congestion. CV: Negative for chest pain, angina, palpitations, dyspnea on exertion, peripheral edema.  Respiratory: Negative for dyspnea at rest, dyspnea on exertion, cough, sputum, wheezing.  GI: See history of present illness. GU:  Negative for dysuria, hematuria, urinary incontinence, urinary frequency, nocturnal urination.  MS: Negative for joint pain, low back pain.  Derm: Negative for rash or itching.  Neuro: Negative for weakness,  abnormal sensation, seizure, frequent headaches, memory loss, confusion.  Psych: Negative for anxiety, depression, suicidal ideation, hallucinations.  Endo: Negative for unusual weight change.  Heme: Negative for bruising or bleeding. Allergy: Negative for rash or hives.   Physical Exam: There were no vitals taken for this visit. General:   Alert and oriented. Pleasant and cooperative. Well-nourished and well-developed.  Head:  Normocephalic and atraumatic. Eyes:  Without icterus, sclera clear and conjunctiva pink.  Ears:  Normal auditory acuity. Mouth:  No deformity or lesions, oral mucosa pink.  Throat/Neck:  Supple, without mass or thyromegaly. Cardiovascular:  S1, S2 present without murmurs appreciated. Normal pulses noted. Extremities without clubbing or edema. Respiratory:  Clear to auscultation bilaterally. No wheezes, rales, or rhonchi. No distress.  Gastrointestinal:  +BS, soft, non-tender and non-distended. No HSM noted. No guarding or rebound. No masses appreciated.  Rectal:  Deferred  Musculoskalatal:  Symmetrical without gross deformities. Normal posture. Skin:  Intact without significant lesions or rashes. Neurologic:  Alert and oriented x4;  grossly normal neurologically. Psych:  Alert and cooperative. Normal mood and affect. Heme/Lymph/Immune: No significant cervical adenopathy. No excessive bruising noted.    03/13/2018 7:57 AM   Disclaimer: This note was dictated with voice recognition software. Similar sounding words can  inadvertently be transcribed and may not be corrected upon review.

## 2018-03-14 ENCOUNTER — Inpatient Hospital Stay (HOSPITAL_COMMUNITY): Payer: Medicare Other

## 2018-03-14 LAB — URINALYSIS, ROUTINE W REFLEX MICROSCOPIC
Bilirubin Urine: NEGATIVE
Glucose, UA: NEGATIVE mg/dL
Ketones, ur: 20 mg/dL — AB
Nitrite: NEGATIVE
Protein, ur: NEGATIVE mg/dL
SPECIFIC GRAVITY, URINE: 1.016 (ref 1.005–1.030)
WBC, UA: >50 WBC/hpf — ABNORMAL HIGH (ref 0–5)
pH: 7 (ref 5.0–8.0)

## 2018-03-14 LAB — BLOOD GAS, ARTERIAL
Acid-Base Excess: 1.4 mmol/L (ref 0.0–2.0)
Bicarbonate: 24.7 mmol/L (ref 20.0–28.0)
Drawn by: 35849
O2 SAT: 91.9 %
Patient temperature: 98.6
pCO2 arterial: 33.9 mmHg (ref 32.0–48.0)
pH, Arterial: 7.475 — ABNORMAL HIGH (ref 7.350–7.450)
pO2, Arterial: 61.6 mmHg — ABNORMAL LOW (ref 83.0–108.0)

## 2018-03-14 LAB — GLUCOSE, CAPILLARY
Glucose-Capillary: 104 mg/dL — ABNORMAL HIGH (ref 70–99)
Glucose-Capillary: 123 mg/dL — ABNORMAL HIGH (ref 70–99)
Glucose-Capillary: 76 mg/dL (ref 70–99)
Glucose-Capillary: 80 mg/dL (ref 70–99)
Glucose-Capillary: 86 mg/dL (ref 70–99)
Glucose-Capillary: 89 mg/dL (ref 70–99)

## 2018-03-14 LAB — BASIC METABOLIC PANEL
ANION GAP: 11 (ref 5–15)
BUN: 12 mg/dL (ref 8–23)
CO2: 25 mmol/L (ref 22–32)
Calcium: 8.2 mg/dL — ABNORMAL LOW (ref 8.9–10.3)
Chloride: 103 mmol/L (ref 98–111)
Creatinine, Ser: 0.56 mg/dL — ABNORMAL LOW (ref 0.61–1.24)
GFR calc non Af Amer: >60 mL/min (ref >60)
Glucose, Bld: 112 mg/dL — ABNORMAL HIGH (ref 70–99)
POTASSIUM: 3.9 mmol/L (ref 3.5–5.1)
Sodium: 139 mmol/L (ref 135–145)

## 2018-03-14 LAB — CBC
HCT: 29.4 % — ABNORMAL LOW (ref 39.0–52.0)
Hemoglobin: 9.2 g/dL — ABNORMAL LOW (ref 13.0–17.0)
MCH: 28.4 pg (ref 26.0–34.0)
MCHC: 31.3 g/dL (ref 30.0–36.0)
MCV: 90.7 fL (ref 80.0–100.0)
NRBC: 0 % (ref 0.0–0.2)
Platelets: 237 10*3/uL (ref 150–400)
RBC: 3.24 MIL/uL — AB (ref 4.22–5.81)
RDW: 13.5 % (ref 11.5–15.5)
WBC: 13.3 10*3/uL — ABNORMAL HIGH (ref 4.0–10.5)

## 2018-03-14 LAB — RAPID URINE DRUG SCREEN, HOSP PERFORMED
Amphetamines: NOT DETECTED
Barbiturates: NOT DETECTED
Benzodiazepines: NOT DETECTED
Cocaine: NOT DETECTED
Opiates: POSITIVE — AB
TETRAHYDROCANNABINOL: NOT DETECTED

## 2018-03-14 LAB — BODY FLUID CULTURE: CULTURE: NO GROWTH

## 2018-03-14 LAB — MAGNESIUM: Magnesium: 2.1 mg/dL (ref 1.7–2.4)

## 2018-03-14 NOTE — Progress Notes (Addendum)
Pt noted to have persistent abnormal mental status unchanged from Dr. Amie Portland evaluation this AM. with frequent PVCs and then tachycardia 120-130s, asymptomatic  -EKG shows sinus tach with RBBB, which has been present on prior EKGs viewable in the (as yet) unmerged chart for same patient -CXR is unremarkable -ABG 7.48/ 34/ 61.6 -Mag wnl  -UA + leukocytes, was apparently being treated for UTI prior to admission  -Urine culture pending  Plan repeat CT head   From unmerged chart:  Past Medical History:  Diagnosis Date  . Arthritis   . BPH (benign prostatic hyperplasia)   . CAD (coronary artery disease)    s/p Xience stent ot the distal RAC 2010, PCI with Xience stent to distal RCA at the [facility] 2011  . COPD (chronic obstructive pulmonary disease) ([place]) 05/22/2008   FEV1/FVS 44.9  . Diabetes mellitus    Well controlled  . GERD (gastroesophageal reflux disease)   . Hyperlipidemia   . Hypertension   . Spontaneous pneumothorax   . Squamous cell carcinoma lung ([place]) 2000   CT w/ CM; 04/25/2009 COPD, s/p RLL, CAD, 5 cm ascending aortic aneurysm  . Thoracic ascending aortic aneurysm (HCC)    4.8 cm   . Type II or unspecified type diabetes mellitus without mention of complication, not stated as uncontrolled     Patient Active Problem List   Diagnosis Date Noted  . TIA (transient ischemic attack) 01/17/2018  . BPH (benign prostatic hyperplasia) 01/17/2018  . GERD (gastroesophageal reflux disease) 01/17/2018  . Dyslipidemia 10/31/2017  . Aortic root dilatation ([place]) 10/31/2017  . Dysphagia 06/23/2017  . Odynophagia 06/23/2017  . Loss of weight 06/23/2017  . Bilateral carotid artery stenosis 10/31/2016  . Nontraumatic incomplete tear of right rotator cuff 01/22/2013  . Pain in joint, shoulder region 01/22/2013  . Dog bite of hand 11/10/2012  . Cellulitis and abscess of hand 11/10/2012  . Hyperlipidemia   . Type 2 diabetes mellitus ([place])   . Jaw pain   .  CAD (coronary artery disease)   . Thoracic aortic aneurysm (TAA) ([place]) 04/28/2009  . HEMOPTYSIS UNSPECIFIED 04/25/2009  . CHEST PAIN, ATYPICAL 04/25/2009  . LUNG CANCER, HX OF 04/25/2009  . Chronic ischemic heart disease 07/02/2008  . MITRAL REGURGITATION 06/28/2008  . Essential hypertension 06/28/2008  . COPD (chronic obstructive pulmonary disease) ([place]) 06/28/2008  . CARDIOVASCULAR FUNCTION STUDY, ABNORMAL 06/19/2008  . AODM 06/13/2008  . Hyperlipemia 06/13/2008  . JAW PAIN 06/13/2008  . PRECORDIAL PAIN 06/13/2008         Past Surgical History:  Procedure Laterality Date  . BACK SURGERY    . CARDIAC CATHETERIZATION  2010   Stent  . ESOPHAGOGASTRODUODENOSCOPY N/A 09/30/2017   Procedure: ESOPHAGOGASTRODUODENOSCOPY (EGD);  Surgeon: Daneil Dolin, MD;  [facility];  Service: Endoscopy;  Laterality: N/A;  2:15PM  . I&D EXTREMITY Left 11/10/2012   Procedure: IRRIGATION AND DEBRIDEMENT Left third-long finger ;  Surgeon: Jolyn Nap, MD;  Location: [place];  Service: Orthopedics;  Laterality: Left;  . LOBECTOMY  2000   Right lower for squamous cell ca  . LUMBAR LAMINECTOMY  1978  . Lymph node resected  1983   Axilla  . MALONEY DILATION N/A 09/30/2017   Procedure: Venia Minks DILATION;  Surgeon: Daneil Dolin, MD;  [facility];  Service: Endoscopy;  Laterality: N/A;  . Needle removed from the right foot    . Spontaneous pneumothrax  1979
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
## 2018-03-14 NOTE — Progress Notes (Signed)
Patient evaluated at bedside.  He is somnolent, but seems agitated at times secondary to pain.  He is unable to localize.  He seems tender to his right wrist and lower abdomen to palpation.  His bladder scan showed 160cc of urine earlier.  He made good urine last night, but none since before 12pm.  Will I&O cath to see if some of his pain may be related to urinary retention if bladder scan is incorrect.  Will also check wrist films given pain elicited during palpation of his right wrist.  He does not follow commands.  Pupils seems equal, but aren't reactive.  We will repeat CT head to assure no further progression of TBI.  All discussed with Dr. Kae Heller.  Henreitta Cea 4:45 PM 03/14/2018

## 2018-03-15 ENCOUNTER — Inpatient Hospital Stay (HOSPITAL_COMMUNITY): Payer: Medicare Other

## 2018-03-15 LAB — GLUCOSE, CAPILLARY
Glucose-Capillary: 150 mg/dL — ABNORMAL HIGH (ref 70–99)
Glucose-Capillary: 84 mg/dL (ref 70–99)
Glucose-Capillary: 95 mg/dL (ref 70–99)
Glucose-Capillary: 98 mg/dL (ref 70–99)
Glucose-Capillary: 99 mg/dL (ref 70–99)
Glucose-Capillary: 99 mg/dL (ref 70–99)

## 2018-03-15 LAB — CBC
HCT: 27.4 % — ABNORMAL LOW (ref 39.0–52.0)
Hemoglobin: 8.9 g/dL — ABNORMAL LOW (ref 13.0–17.0)
MCH: 29.3 pg (ref 26.0–34.0)
MCHC: 32.5 g/dL (ref 30.0–36.0)
MCV: 90.1 fL (ref 80.0–100.0)
Platelets: 285 10*3/uL (ref 150–400)
RBC: 3.04 MIL/uL — AB (ref 4.22–5.81)
RDW: 13.8 % (ref 11.5–15.5)
WBC: 15.6 10*3/uL — ABNORMAL HIGH (ref 4.0–10.5)
nRBC: 0.1 % (ref 0.0–0.2)

## 2018-03-15 LAB — BASIC METABOLIC PANEL
Anion gap: 9 (ref 5–15)
BUN: 16 mg/dL (ref 8–23)
CO2: 23 mmol/L (ref 22–32)
Calcium: 8.1 mg/dL — ABNORMAL LOW (ref 8.9–10.3)
Chloride: 107 mmol/L (ref 98–111)
Creatinine, Ser: 0.53 mg/dL — ABNORMAL LOW (ref 0.61–1.24)
GFR calc Af Amer: >60 mL/min (ref >60)
GFR calc non Af Amer: >60 mL/min (ref >60)
Glucose, Bld: 104 mg/dL — ABNORMAL HIGH (ref 70–99)
Potassium: 3.9 mmol/L (ref 3.5–5.1)
Sodium: 139 mmol/L (ref 135–145)

## 2018-03-15 LAB — PHOSPHORUS: Phosphorus: 2.7 mg/dL (ref 2.5–4.6)

## 2018-03-15 LAB — MAGNESIUM: MAGNESIUM: 2.2 mg/dL (ref 1.7–2.4)

## 2018-03-15 MED ORDER — PIVOT 1.5 CAL PO LIQD
1000.0000 mL | ORAL | Status: DC
  Administered 2018-03-15: 1000 mL
  Filled 2018-03-15 (×4): qty 1000

## 2018-03-15 MED ORDER — SODIUM CHLORIDE 0.9 % IV SOLN
1.0000 g | Freq: Two times a day (BID) | INTRAVENOUS | Status: DC
  Administered 2018-03-15 – 2018-03-18 (×5): 1 g via INTRAVENOUS
  Filled 2018-03-15 (×7): qty 1

## 2018-03-15 NOTE — Progress Notes (Signed)
Patient arrived to unit, vitals stable. Pt disoriented x4. Continue to monitor patient.
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
## 2018-03-15 NOTE — Progress Notes (Signed)
Orthopedic Tech Progress Note Patient Details:  Brian Austin 09-Oct-1930 [id number]  Ortho Devices Type of Ortho Device: Ace wrap, Sugartong splint, Arm sling Ortho Device/Splint Location: RLE Ortho Device/Splint Interventions: Application   Post Interventions Patient Tolerated: Poor Instructions Provided: Care of device   [place] Pink 03/15/2018, 10:36 AM

## 2018-03-15 NOTE — Progress Notes (Signed)
Patient ID: Brian Austin, male   DOB: March 10, 1930, 85 y.o.   MRN: [id number]   LOS: 4 days   Subjective: Pt non-communicative   Objective: Vital signs in last 24 hours: Temp:  [98.5 F (36.9 C)-100.7 F (38.2 C)] 98.5 F (36.9 C) (01/29 0800) Pulse Rate:  [78-116] 78 (01/29 0900) Resp:  [12-26] 15 (01/29 0900) BP: (75-174)/(43-108) 128/51 (01/29 0900) SpO2:  [90 %-99 %] 99 % (01/29 0900) Last BM Date: (PTA)   Laboratory  CBC Recent Labs    03/14/18 0636  WBC 13.3*  HGB 9.2*  HCT 29.4*  PLT 237   BMET Recent Labs    03/14/18 0636  NA 139  K 3.9  CL 103  CO2 25  GLUCOSE 112*  BUN 12  CREATININE 0.56*  CALCIUM 8.2*     Physical Exam General appearance: alert and no distress  Right shoulder, elbow, wrist, digits- no skin wounds, grimace w/palpation of ulna, no instability, no blocks to motion  Sens  Ax/R/M/U could not assess  Mot   Ax/ R/ PIN/ M/ AIN/ U could not assess  Rad 2+    Assessment/Plan: Right ulna fx -- Sugar tong splint, NWB.    Brian Abu, PA-C Orthopedic Surgery [phone] 03/15/2018
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
## 2018-03-15 NOTE — Procedures (Signed)
Cortrak  Tube Type:  Cortrak - 43 inches Tube Location:  Right nare Initial Placement:  Stomach Secured by: Bridle Technique Used to Measure Tube Placement:  Documented cm marking at nare/ corner of mouth Cortrak Secured At:  62 cm    Cortrak Tube Team Note:  Consult received to place a Cortrak feeding tube.   X-ray is required, abdominal x-ray has been ordered by the Cortrak team. Please confirm tube placement before using the Cortrak tube.   If the tube becomes dislodged please keep the tube and contact the Cortrak team at [email or web address] (password [email or web address]) for replacement.  If after hours and replacement cannot be delayed, place a NG tube and confirm placement with an abdominal x-ray.    Koleen Distance MS, RD, LDN Pager #- [phone] Office#- [phone] After Hours Pager: [phone]
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
## 2018-03-16 ENCOUNTER — Inpatient Hospital Stay (HOSPITAL_COMMUNITY): Payer: Medicare Other

## 2018-03-16 LAB — GLUCOSE, CAPILLARY
Glucose-Capillary: 145 mg/dL — ABNORMAL HIGH (ref 70–99)
Glucose-Capillary: 130 mg/dL — ABNORMAL HIGH (ref 70–99)
Glucose-Capillary: 98 mg/dL (ref 70–99)
Glucose-Capillary: 112 mg/dL — ABNORMAL HIGH (ref 70–99)
Glucose-Capillary: 98 mg/dL (ref 70–99)
Glucose-Capillary: 116 mg/dL — ABNORMAL HIGH (ref 70–99)

## 2018-03-16 LAB — CBC
HCT: 27.4 % — ABNORMAL LOW (ref 39.0–52.0)
Hemoglobin: 8.5 g/dL — ABNORMAL LOW (ref 13.0–17.0)
MCH: 28.1 pg (ref 26.0–34.0)
MCHC: 31 g/dL (ref 30.0–36.0)
MCV: 90.4 fL (ref 80.0–100.0)
Platelets: 292 10*3/uL (ref 150–400)
RBC: 3.03 MIL/uL — ABNORMAL LOW (ref 4.22–5.81)
RDW: 13.9 % (ref 11.5–15.5)
WBC: 15.6 10*3/uL — AB (ref 4.0–10.5)
nRBC: 0.1 % (ref 0.0–0.2)

## 2018-03-16 LAB — PHOSPHORUS
PHOSPHORUS: 2.5 mg/dL (ref 2.5–4.6)
Phosphorus: 2.9 mg/dL (ref 2.5–4.6)

## 2018-03-16 LAB — URINE CULTURE: Culture: >=100000 — AB

## 2018-03-16 LAB — MAGNESIUM
Magnesium: 2.2 mg/dL (ref 1.7–2.4)
Magnesium: 2.2 mg/dL (ref 1.7–2.4)

## 2018-03-16 MED ORDER — ACETAMINOPHEN 160 MG/5ML PO SOLN
650.0000 mg | Freq: Four times a day (QID) | ORAL | Status: DC | PRN
  Administered 2018-03-16 – 2018-03-18 (×7): 650 mg
  Filled 2018-03-16 (×7): qty 20.3

## 2018-03-16 MED ORDER — IPRATROPIUM-ALBUTEROL 0.5-2.5 (3) MG/3ML IN SOLN
3.0000 mL | Freq: Four times a day (QID) | RESPIRATORY_TRACT | Status: DC
  Administered 2018-03-16 – 2018-03-18 (×7): 3 mL via RESPIRATORY_TRACT
  Filled 2018-03-16 (×3): qty 3
  Filled 2018-03-16: qty 9
  Filled 2018-03-16 (×4): qty 3

## 2018-03-16 MED ORDER — GUAIFENESIN 100 MG/5ML PO SOLN
15.0000 mL | Freq: Four times a day (QID) | ORAL | Status: DC
  Administered 2018-03-16 – 2018-03-18 (×7): 300 mg
  Filled 2018-03-16 (×3): qty 20
  Filled 2018-03-16 (×3): qty 10
  Filled 2018-03-16 (×2): qty 20

## 2018-03-16 MED ORDER — ACETAMINOPHEN 325 MG PO TABS: 650.0000 mg | ORAL_TABLET | Freq: Four times a day (QID) | ORAL | Status: DC | PRN

## 2018-03-16 MED ORDER — BACITRACIN ZINC 500 UNIT/GM EX OINT
TOPICAL_OINTMENT | Freq: Two times a day (BID) | CUTANEOUS | Status: DC
  Administered 2018-03-16 – 2018-03-21 (×12): via TOPICAL
  Filled 2018-03-16: qty 28.35
  Filled 2018-03-16: qty 28.4

## 2018-03-16 NOTE — Progress Notes (Addendum)
1915 Bedside shift report, patient on NRB mask at 15L, labored breathing, O2 sats in high 90s, titrated O2 down to 6L, patient arouses to voice, moans, not following commands. Fall precautions in place, [facility]   2000 RN at bedside to assess patient, see flowsheet. Pt arouses to voice, moaning, agitated with breathing treatment mask. Patient not following commands, moans, yells, with slight touches to extremities. Pt keeps attempting to take off breathing treatment mask. BS checked, 116, patient with low grade temp. Patient turned and repositioned. Fall precautions in place, [facility]   2100 Patient started desaturating on 4L [place], changed to venti mask, patient is breathing through mouth, increased O2 to 12L with O2 sats in low 90s. RN called respiratory to make aware of change.   2230 Trauma MD at bedside, asked why patient not on BiPap. Informed MD of previous RN's note. Pt tolerating venti mask, [facility].

## 2018-03-16 NOTE — Progress Notes (Signed)
Patient presented with a fever and coughing up light brown mucous at 0000 03/16/18.  Stopped tube feedings, gave patient tylenol and ordered xray. Results showed lung infection, cortrak was below diaphragm but the tip was not visible. Called Dr. Redmond Pulling and he said to stop tube feedings for tonight. Marlene Bast, RN

## 2018-03-16 NOTE — Progress Notes (Signed)
Patient decrease saturations down to mid 70s, increase demand for O2. Nurse tried with nasal canula then nonrebreather @ 15 liters per minute.  Trauma paged and verbal order a bipap. Respiratory paged and are coming to assess patient. Patient with thick secretions and no gag reflex RT do not believe bipap is a good idea.

## 2018-03-16 NOTE — Progress Notes (Signed)
Patient given dilaudid which helped with SP02.  Nurse titrated nonrebreather from 15-6 liters/minute.   3rd shift told.
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
## 2018-03-16 NOTE — Progress Notes (Signed)
Pt. Extremely agitated and does not tolerate tx. Rn and RT at bedside.
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
## 2018-03-16 NOTE — Progress Notes (Signed)
SLP Cancellation Note  Patient Details Name: Brian Austin MRN: [id number] DOB: Jul 12, 1930   Cancelled treatment:        Spoke with RN tech who states pt's alertness continues to fluctuate. Will continue treatment for swallow and cognition.    [name] 03/16/2018, 4:09 PM  Orbie Pyo Colvin Caroli.Ed Risk analyst [phone] Office [phone]

## 2018-03-16 NOTE — Progress Notes (Signed)
Patient family requesting frequent suction.  Nurse stacked room with trach suction because bulb is too large.  Phelgm is a whitish yellowish color and very thick. Patient responds better to pain stimuli, I have been pinching his shoulders which makes him yell and in turn helps to bring up the sputum even though he is not expelling it by himself.   Spoke with Dr. Grandville Silos about an order for chest pt as well as something to thin out secretions. New orders have been placed.   Family educated and are at bedside.

## 2018-03-17 ENCOUNTER — Inpatient Hospital Stay (HOSPITAL_COMMUNITY): Payer: Medicare Other

## 2018-03-17 LAB — CBC
HCT: 27.8 % — ABNORMAL LOW (ref 39.0–52.0)
Hemoglobin: 8.8 g/dL — ABNORMAL LOW (ref 13.0–17.0)
MCH: 29.2 pg (ref 26.0–34.0)
MCHC: 31.7 g/dL (ref 30.0–36.0)
MCV: 92.4 fL (ref 80.0–100.0)
Platelets: 343 10*3/uL (ref 150–400)
RBC: 3.01 MIL/uL — AB (ref 4.22–5.81)
RDW: 14.3 % (ref 11.5–15.5)
WBC: 16.7 10*3/uL — ABNORMAL HIGH (ref 4.0–10.5)
nRBC: 0.1 % (ref 0.0–0.2)

## 2018-03-17 LAB — COMPREHENSIVE METABOLIC PANEL
ALT: 38 U/L (ref 0–44)
AST: 26 U/L (ref 15–41)
Albumin: 2.3 g/dL — ABNORMAL LOW (ref 3.5–5.0)
Alkaline Phosphatase: 58 U/L (ref 38–126)
Anion gap: 10 (ref 5–15)
BUN: 15 mg/dL (ref 8–23)
CO2: 24 mmol/L (ref 22–32)
Calcium: 8.4 mg/dL — ABNORMAL LOW (ref 8.9–10.3)
Chloride: 111 mmol/L (ref 98–111)
Creatinine, Ser: 0.56 mg/dL — ABNORMAL LOW (ref 0.61–1.24)
GFR calc Af Amer: >60 mL/min (ref >60)
Glucose, Bld: 114 mg/dL — ABNORMAL HIGH (ref 70–99)
Potassium: 4.3 mmol/L (ref 3.5–5.1)
Sodium: 145 mmol/L (ref 135–145)
Total Bilirubin: 1.9 mg/dL — ABNORMAL HIGH (ref 0.3–1.2)
Total Protein: 5.9 g/dL — ABNORMAL LOW (ref 6.5–8.1)

## 2018-03-17 LAB — BLOOD GAS, ARTERIAL
Acid-Base Excess: 3.4 mmol/L — ABNORMAL HIGH (ref 0.0–2.0)
Bicarbonate: 27.5 mmol/L (ref 20.0–28.0)
Drawn by: 54887
O2 Content: 15 L/min
O2 Saturation: 94.9 %
Patient temperature: 98.6
pCO2 arterial: 42.3 mmHg (ref 32.0–48.0)
pH, Arterial: 7.428 (ref 7.350–7.450)
pO2, Arterial: 72.3 mmHg — ABNORMAL LOW (ref 83.0–108.0)

## 2018-03-17 LAB — PHOSPHORUS: Phosphorus: 3 mg/dL (ref 2.5–4.6)

## 2018-03-17 LAB — MAGNESIUM: Magnesium: 2.2 mg/dL (ref 1.7–2.4)

## 2018-03-17 LAB — GLUCOSE, CAPILLARY
Glucose-Capillary: 101 mg/dL — ABNORMAL HIGH (ref 70–99)
Glucose-Capillary: 117 mg/dL — ABNORMAL HIGH (ref 70–99)
Glucose-Capillary: 127 mg/dL — ABNORMAL HIGH (ref 70–99)
Glucose-Capillary: 92 mg/dL (ref 70–99)
Glucose-Capillary: 93 mg/dL (ref 70–99)
Glucose-Capillary: 94 mg/dL (ref 70–99)
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
## 2018-03-17 NOTE — Progress Notes (Signed)
Palliative called to update that they will not be able to consult sooner than tomorrow or the next day. Palliative will call family and schedule a meet. Family is still on the way to visit with love one.

## 2018-03-17 NOTE — Progress Notes (Signed)
RT NOTE:  Pt is currently sleeping. Will attempt CPT at a later time. RN informed.

## 2018-03-17 NOTE — Care Management Important Message (Signed)
Important Message  Patient Details  Name: Brian Austin MRN: [id number] Date of Birth: [birth year]   Medicare Important Message Given:  Yes    [name] 03/17/2018, 2:30 PM
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
## 2018-03-17 NOTE — Progress Notes (Addendum)
Spoke with P.A Rayburn, waiting for the family to make a decision about intubation and then to call back. Patient sister called back and family has decided not to intubate based off of what patient wishes. Spoke with family about changing status from full code to DNI (not DNR) and they agree. Family is on the way to the hospital.   P.A paged.

## 2018-03-17 NOTE — Progress Notes (Signed)
Speech Language Pathology Discharge Patient Details Name: Brian Austin MRN: [id number] DOB: [birth year] Today's Date: 03/17/2018 Time:  -     Patient discharged from SLP services secondary to medical decline - will need to re-order SLP to resume therapy services. Pt's status continued to decline. Family decided on comfort care. ST will sign off for now. If needed for education etc, please reconsult. Recommend continue oral care for comfort.  Please see latest therapy progress note for current level of functioning and progress toward goals.    Progress and discharge plan discussed with patient and/or caregiver: Patient unable to participate in discharge planning and no caregivers available  GO     [name] 03/17/2018, 1:44 PM    Orbie Pyo Colvin Caroli.Ed Risk analyst [phone] Office [phone]

## 2018-03-17 NOTE — Progress Notes (Signed)
RT NOTE:  Unable to perform CPT with pt. Pt is in a lot of pain at this time. Will attempt at a later time.
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
## 2018-03-17 NOTE — Clinical Social Work Note (Signed)
Clinical Social Worker continuing to follow patient and family for support and discharge planning needs.  Patient now with respiratory difficulties and requiring 15L HFNC.  Patient family has transitioned patient to DNR and Palliative Consult pending.  CSW remains available for support and to assist with discharge planning once medically appropriate.  Brian Austin, [place]

## 2018-03-17 NOTE — Progress Notes (Signed)
Report given, patient went back and forth all night with o2 demands. Now on NRB @ 15 liters. Nurse attempted to titrate down and patient desats to the high 80s. Nurse increased back to 15 liters. Patient takes a while to come back up but now at 92%.   Patient opens eyes to verbal. Non responsive.   Nurse spoke with P.A Rayburn this morning about course of action for patient.  20 respirations 92% SPO2  85 heart rate

## 2018-03-17 NOTE — Progress Notes (Signed)
PT Cancellation Note/Discharge  Patient Details Name: Brian Austin MRN: [id number] DOB: [birth year]   Cancelled Treatment:    Reason Eval/Treat Not Completed: Medical issues which prohibited therapy.  Pt now on NRB with no plans to re-intubate.  He was made a DNR today.  Prognosis is poor.  PT to sign off for now.  Please re-order if pt turns the corner for the better.    Thanks,  Barbarann Ehlers. Erik Nessel, PT, DPT  Acute Rehabilitation [phone] pager [phone]) [phone] office     [name] 03/17/2018, 12:23 PM

## 2018-03-17 NOTE — Progress Notes (Addendum)
0200 RN titrating patient's venti mask throughout night from 6L-10L. Patient desaturates quickly and takes a long time to recover. Pt also waking up, agitated and pulling off O2 mask. Mitten applied to right hand. Pt frequently reoriented, but confused, not following commands. Groans with slightest movement or touch from RN. Fall precautions in place, [facility].   0255 Patient desaturated to high 70s, NRB mask placed on patient, 15L, after 5 minutes, patient recovered to high 90s. Will attempt to wean down O2.   0630 Pt would desaturate during night on NRB mask, seemed to be related to pain. RN medicated with pain meds and patient would relax and breath. O2 sats would return to WNL. Fall precautions in place, awaiting day shift RN for report.

## 2018-03-17 NOTE — Progress Notes (Addendum)
OT Cancellation Note  Patient Details Name: Brian Austin MRN: [id number] DOB: Mar 12, 1930   Cancelled Treatment:    Reason Eval/Treat Not Completed: Patient demonstrates medical declined and not appropriate for continued therapy at this time.   Richelle Ito, OTR/L  Acute Rehabilitation Services Pager: [phone] Office: [phone] .  03/17/2018, 2:25 PM

## 2018-03-18 ENCOUNTER — Inpatient Hospital Stay (HOSPITAL_COMMUNITY): Payer: Medicare Other

## 2018-03-18 DIAGNOSIS — R52 Pain, unspecified: Secondary | ICD-10-CM

## 2018-03-18 DIAGNOSIS — J22 Unspecified acute lower respiratory infection: Secondary | ICD-10-CM

## 2018-03-18 DIAGNOSIS — Z515 Encounter for palliative care: Secondary | ICD-10-CM

## 2018-03-18 DIAGNOSIS — Z7189 Other specified counseling: Secondary | ICD-10-CM

## 2018-03-18 DIAGNOSIS — T148XXA Other injury of unspecified body region, initial encounter: Secondary | ICD-10-CM

## 2018-03-18 LAB — GLUCOSE, CAPILLARY
GLUCOSE-CAPILLARY: 145 mg/dL — AB (ref 70–99)
Glucose-Capillary: 95 mg/dL (ref 70–99)
Glucose-Capillary: 112 mg/dL — ABNORMAL HIGH (ref 70–99)

## 2018-03-18 LAB — CBC
HCT: 31 % — ABNORMAL LOW (ref 39.0–52.0)
Hemoglobin: 9.5 g/dL — ABNORMAL LOW (ref 13.0–17.0)
MCH: 28 pg (ref 26.0–34.0)
MCHC: 30.6 g/dL (ref 30.0–36.0)
MCV: 91.4 fL (ref 80.0–100.0)
Platelets: 421 10*3/uL — ABNORMAL HIGH (ref 150–400)
RBC: 3.39 MIL/uL — ABNORMAL LOW (ref 4.22–5.81)
RDW: 14.5 % (ref 11.5–15.5)
WBC: 25 10*3/uL — ABNORMAL HIGH (ref 4.0–10.5)
nRBC: 0.1 % (ref 0.0–0.2)

## 2018-03-18 MED ORDER — LORAZEPAM 2 MG/ML IJ SOLN
0.5000 mg | INTRAMUSCULAR | Status: DC | PRN
  Administered 2018-03-19 – 2018-03-21 (×3): 1 mg via INTRAVENOUS
  Filled 2018-03-18 (×3): qty 1

## 2018-03-18 MED ORDER — IPRATROPIUM-ALBUTEROL 0.5-2.5 (3) MG/3ML IN SOLN: 3.0000 mL | RESPIRATORY_TRACT | Status: DC | PRN

## 2018-03-18 MED ORDER — DOCUSATE SODIUM 50 MG/5ML PO LIQD: 50.0000 mg | Freq: Two times a day (BID) | ORAL | Status: DC

## 2018-03-18 MED ORDER — PIVOT 1.5 CAL PO LIQD
1000.0000 mL | ORAL | Status: DC
  Administered 2018-03-18: 1000 mL
  Filled 2018-03-18: qty 1000

## 2018-03-18 MED ORDER — SODIUM CHLORIDE 0.9 % IV SOLN
1.0000 mg/h | INTRAVENOUS | Status: DC
  Administered 2018-03-18 – 2018-03-21 (×3): 0.5 mg/h via INTRAVENOUS
  Filled 2018-03-18 (×3): qty 2.5

## 2018-03-18 MED ORDER — HYDROMORPHONE HCL 1 MG/ML IJ SOLN
1.0000 mg | INTRAMUSCULAR | Status: AC
  Administered 2018-03-18: 1 mg via INTRAVENOUS
  Filled 2018-03-18: qty 1

## 2018-03-18 MED ORDER — HYDROCODONE-ACETAMINOPHEN 7.5-325 MG/15ML PO SOLN
15.0000 mL | ORAL | Status: DC | PRN
  Administered 2018-03-18: 15 mL via ORAL
  Filled 2018-03-18: qty 15

## 2018-03-18 MED ORDER — ACETAMINOPHEN 160 MG/5ML PO SOLN: 325.0000 mg | Freq: Four times a day (QID) | ORAL | Status: DC | PRN

## 2018-03-18 MED ORDER — HYDROMORPHONE HCL 1 MG/ML IJ SOLN: 1.0000 mg | INTRAMUSCULAR | Status: DC | PRN

## 2018-03-18 MED ORDER — KETOROLAC TROMETHAMINE 15 MG/ML IJ SOLN: 15.0000 mg | Freq: Three times a day (TID) | INTRAMUSCULAR | Status: DC | PRN

## 2018-03-18 MED ORDER — GLYCOPYRROLATE 0.2 MG/ML IJ SOLN
0.2000 mg | INTRAMUSCULAR | Status: DC | PRN
  Administered 2018-03-19: 0.2 mg via INTRAVENOUS
  Filled 2018-03-18: qty 1

## 2018-03-18 MED ORDER — LORAZEPAM 2 MG/ML IJ SOLN: 0.5000 mg | INTRAMUSCULAR | Status: DC | PRN

## 2018-03-18 MED ORDER — POLYETHYLENE GLYCOL 3350 17 G PO PACK: 17.0000 g | PACK | Freq: Every day | ORAL | Status: DC

## 2018-03-18 MED ORDER — HYDROMORPHONE BOLUS VIA INFUSION
1.0000 mg | INTRAVENOUS | Status: DC | PRN
  Administered 2018-03-18 – 2018-03-21 (×5): 1 mg via INTRAVENOUS
  Filled 2018-03-18: qty 1
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
## 2018-03-18 DEATH — deceased

## 2018-03-19 DIAGNOSIS — S0219XA Other fracture of base of skull, initial encounter for closed fracture: Secondary | ICD-10-CM

## 2018-03-19 DIAGNOSIS — Z515 Encounter for palliative care: Secondary | ICD-10-CM
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
## 2018-03-20 LAB — BLOOD CULTURE ID PANEL (REFLEXED)
Acinetobacter baumannii: NOT DETECTED
Candida albicans: NOT DETECTED
Candida glabrata: NOT DETECTED
Candida krusei: NOT DETECTED
Candida parapsilosis: NOT DETECTED
Candida tropicalis: NOT DETECTED
Enterobacter cloacae complex: NOT DETECTED
Enterobacteriaceae species: NOT DETECTED
Enterococcus species: NOT DETECTED
Escherichia coli: NOT DETECTED
Haemophilus influenzae: NOT DETECTED
Klebsiella oxytoca: NOT DETECTED
Klebsiella pneumoniae: NOT DETECTED
Listeria monocytogenes: NOT DETECTED
METHICILLIN RESISTANCE: DETECTED — AB
Neisseria meningitidis: NOT DETECTED
PROTEUS SPECIES: NOT DETECTED
Pseudomonas aeruginosa: NOT DETECTED
STAPHYLOCOCCUS AUREUS BCID: NOT DETECTED
STAPHYLOCOCCUS SPECIES: DETECTED — AB
Serratia marcescens: NOT DETECTED
Streptococcus agalactiae: NOT DETECTED
Streptococcus pneumoniae: NOT DETECTED
Streptococcus pyogenes: NOT DETECTED
Streptococcus species: NOT DETECTED

## 2018-03-20 LAB — BLOOD PRODUCT ORDER (VERBAL) VERIFICATION

## 2018-03-20 NOTE — Progress Notes (Signed)
   03/20/18 1200  Clinical Encounter Type  Visited With Patient;Health care provider  Visit Type Initial;Patient actively dying   Pt is EOL, brief visit, pt did not respond to me talking.  Spoke w/ RN, let him know I am available to pg for support if need arises, for pt or family.  Myra Gianotti resident, [phone]

## 2018-03-20 NOTE — Progress Notes (Signed)
Palliative Medicine RN Note: Symptom check.   Brian Austin in resting with eyes closed. Respirations are shallow and rapid. He remains on 5L O2 via [place].   O2 stopped; RN Iona Beard gave a bolus dose of hydromorphone via infusion. I spoke with Trauma PA to get/give update. [facility] notified me there would not be a bed today.   I called and updated his sister Deloris. Plan for PMT follow up in the am.  Marjie Skiff. Sayid Moll, RN, BSN, [facility] Palliative Medicine Team 03/20/2018 2:46 PM Office [phone]

## 2018-03-20 NOTE — Clinical Social Work Note (Addendum)
Received consult for residential hospice for patient. Call made to sister Brian Austin - [phone] (home) regarding consult and hospice preference and [facility] is preference. Call made to Hospice and referral made with Intake staff person [facility]. Requested clinicals transmitted to facility. CSW will continue to follow and assist as needed with hospice placement.  Talked with patient's sister Brian Austin and she was able to access and confirm patient's social security number, which is the same as his [employer] number. Financial counselor contacted and informed.   Brian Austin, MSW, LCSW Licensed Clinical Social Worker [place] [phone]
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
## 2018-03-20 NOTE — Progress Notes (Signed)
Nutrition Brief Note  Chart reviewed. Pt now transitioning to comfort care.  No further nutrition interventions warranted at this time.  Please re-consult as needed.   Axl Rodino A. Javonne Dorko, RD, LDN, CDE Pager: [phone] After hours Pager: [phone]  
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
## 2018-03-21 LAB — CULTURE, BLOOD (ROUTINE X 2)

## 2018-03-21 MED ORDER — LORAZEPAM 2 MG/ML IJ SOLN
0.5000 mg/h | INTRAVENOUS | Status: DC
  Administered 2018-03-21: 0.5 mg/h via INTRAVENOUS
  Filled 2018-03-21: qty 25

## 2018-03-21 MED ORDER — LORAZEPAM 2 MG/ML IJ SOLN: 2.0000 mg | INTRAMUSCULAR | Status: DC

## 2018-03-21 MED ORDER — LORAZEPAM 2 MG/ML IJ SOLN: 1.0000 mg | INTRAMUSCULAR | Status: DC | PRN

## 2018-03-21 MED ORDER — HYDROMORPHONE BOLUS VIA INFUSION
2.0000 mg | INTRAVENOUS | Status: DC | PRN
  Administered 2018-03-21: 2 mg via INTRAVENOUS
  Filled 2018-03-21: qty 2
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
## 2018-03-21 NOTE — Social Work (Signed)
Aware pt is first on waiting list for [facility]. CSW continuing to follow for support with disposition when medically appropriate. I appreciate PMT also following for support with disposition and support for pt/pt family.  Westley Hummer, MSW, [place] [phone]
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
## 2018-03-21 NOTE — Care Management Important Message (Signed)
Important Message  Patient Details  Name: Brian Austin MRN: [id number] Date of Birth: [birth year]   Medicare Important Message Given:  Yes    Orbie Pyo 03/21/2018, 3:45 PM

## 2018-03-22 ENCOUNTER — Encounter (HOSPITAL_COMMUNITY): Payer: Self-pay

## 2018-03-23 LAB — CULTURE, BLOOD (ROUTINE X 2)
Culture: NO GROWTH
Special Requests: ADEQUATE
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
## 2018-04-16 DEATH — deceased
